# Patient Record
Sex: Male | Born: 1935 | Race: White | Hispanic: No | Marital: Married | Smoking: Never smoker
Health system: Southern US, Community
[De-identification: ages and names within clinical notes are randomized; demographics above are authoritative.]

## PROBLEM LIST (undated history)

## (undated) DIAGNOSIS — G950 Syringomyelia and syringobulbia: Secondary | ICD-10-CM

## (undated) DIAGNOSIS — S32019A Unspecified fracture of first lumbar vertebra, initial encounter for closed fracture: Secondary | ICD-10-CM

## (undated) DIAGNOSIS — I10 Essential (primary) hypertension: Secondary | ICD-10-CM

## (undated) DIAGNOSIS — S72142A Displaced intertrochanteric fracture of left femur, initial encounter for closed fracture: Secondary | ICD-10-CM

## (undated) DIAGNOSIS — Z9889 Other specified postprocedural states: Secondary | ICD-10-CM

## (undated) DIAGNOSIS — E785 Hyperlipidemia, unspecified: Secondary | ICD-10-CM

## (undated) DIAGNOSIS — H409 Unspecified glaucoma: Secondary | ICD-10-CM

## (undated) DIAGNOSIS — M5030 Other cervical disc degeneration, unspecified cervical region: Secondary | ICD-10-CM

## (undated) DIAGNOSIS — E119 Type 2 diabetes mellitus without complications: Secondary | ICD-10-CM

## (undated) DIAGNOSIS — M549 Dorsalgia, unspecified: Secondary | ICD-10-CM

## (undated) DIAGNOSIS — K5730 Diverticulosis of large intestine without perforation or abscess without bleeding: Secondary | ICD-10-CM

## (undated) DIAGNOSIS — N400 Enlarged prostate without lower urinary tract symptoms: Secondary | ICD-10-CM

## (undated) DIAGNOSIS — J90 Pleural effusion, not elsewhere classified: Secondary | ICD-10-CM

## (undated) DIAGNOSIS — K649 Unspecified hemorrhoids: Secondary | ICD-10-CM

## (undated) DIAGNOSIS — M48061 Spinal stenosis, lumbar region without neurogenic claudication: Secondary | ICD-10-CM

## (undated) DIAGNOSIS — K5900 Constipation, unspecified: Secondary | ICD-10-CM

## (undated) DIAGNOSIS — Z87448 Personal history of other diseases of urinary system: Secondary | ICD-10-CM

## (undated) DIAGNOSIS — Z9289 Personal history of other medical treatment: Secondary | ICD-10-CM

## (undated) DIAGNOSIS — G8929 Other chronic pain: Secondary | ICD-10-CM

## (undated) HISTORY — DX: Diverticulosis of large intestine without perforation or abscess without bleeding: K57.30

## (undated) HISTORY — DX: Displaced intertrochanteric fracture of left femur, initial encounter for closed fracture: S72.142A

## (undated) HISTORY — DX: Personal history of other medical treatment: Z92.89

## (undated) HISTORY — DX: Other cervical disc degeneration, unspecified cervical region: M50.30

## (undated) HISTORY — DX: Hyperlipidemia, unspecified: E78.5

## (undated) HISTORY — DX: Type 2 diabetes mellitus without complications: E11.9

## (undated) HISTORY — DX: Syringomyelia and syringobulbia: G95.0

## (undated) HISTORY — DX: Constipation, unspecified: K59.00

## (undated) HISTORY — DX: Pleural effusion, not elsewhere classified: J90

## (undated) HISTORY — DX: Personal history of other diseases of urinary system: Z87.448

## (undated) HISTORY — DX: Spinal stenosis, lumbar region without neurogenic claudication: M48.061

## (undated) HISTORY — DX: Unspecified hemorrhoids: K64.9

## (undated) HISTORY — DX: Other specified postprocedural states: Z98.890

## (undated) HISTORY — DX: Benign prostatic hyperplasia without lower urinary tract symptoms: N40.0

## (undated) HISTORY — DX: Unspecified fracture of first lumbar vertebra, initial encounter for closed fracture: S32.019A

---

## 1991-07-31 DIAGNOSIS — Z9889 Other specified postprocedural states: Secondary | ICD-10-CM

## 1991-07-31 HISTORY — DX: Other specified postprocedural states: Z98.890

## 1993-07-02 ENCOUNTER — Encounter: Payer: Self-pay | Admitting: Family Medicine

## 1995-01-31 ENCOUNTER — Encounter: Payer: Self-pay | Admitting: Family Medicine

## 1995-01-31 LAB — CONVERTED CEMR LAB: PSA: 0.7 ng/mL

## 1995-12-31 ENCOUNTER — Encounter: Payer: Self-pay | Admitting: Family Medicine

## 1997-08-30 ENCOUNTER — Encounter: Payer: Self-pay | Admitting: Family Medicine

## 1998-07-13 HISTORY — PX: PROSTATE BIOPSY: SHX241

## 1998-07-18 ENCOUNTER — Other Ambulatory Visit: Admission: RE | Admit: 1998-07-18 | Discharge: 1998-07-18 | Payer: Self-pay | Admitting: Urology

## 1998-11-24 ENCOUNTER — Ambulatory Visit (HOSPITAL_COMMUNITY): Admission: RE | Admit: 1998-11-24 | Discharge: 1998-11-24 | Payer: Self-pay | Admitting: Urology

## 1998-11-24 ENCOUNTER — Encounter: Payer: Self-pay | Admitting: Urology

## 1999-02-27 ENCOUNTER — Encounter: Admission: RE | Admit: 1999-02-27 | Discharge: 1999-03-18 | Payer: Self-pay | Admitting: Orthopaedic Surgery

## 1999-06-02 ENCOUNTER — Encounter: Payer: Self-pay | Admitting: Family Medicine

## 1999-06-02 LAB — CONVERTED CEMR LAB: PSA: 0.6 ng/mL

## 1999-08-31 HISTORY — PX: CATARACT EXTRACTION: SUR2

## 2000-06-01 ENCOUNTER — Encounter: Payer: Self-pay | Admitting: Family Medicine

## 2000-06-01 LAB — CONVERTED CEMR LAB: PSA: 0.4 ng/mL

## 2001-06-01 ENCOUNTER — Encounter: Payer: Self-pay | Admitting: Family Medicine

## 2001-06-01 LAB — CONVERTED CEMR LAB: PSA: 0.4 ng/mL

## 2001-07-06 ENCOUNTER — Encounter: Payer: Self-pay | Admitting: Orthopaedic Surgery

## 2001-07-06 ENCOUNTER — Encounter: Admission: RE | Admit: 2001-07-06 | Discharge: 2001-07-06 | Payer: Self-pay | Admitting: Orthopaedic Surgery

## 2001-07-07 ENCOUNTER — Ambulatory Visit (HOSPITAL_BASED_OUTPATIENT_CLINIC_OR_DEPARTMENT_OTHER): Admission: RE | Admit: 2001-07-07 | Discharge: 2001-07-08 | Payer: Self-pay | Admitting: Orthopaedic Surgery

## 2001-08-30 HISTORY — PX: SHOULDER SURGERY: SHX246

## 2002-03-30 ENCOUNTER — Encounter: Admission: RE | Admit: 2002-03-30 | Discharge: 2002-03-30 | Payer: Self-pay | Admitting: Family Medicine

## 2002-03-30 ENCOUNTER — Encounter: Payer: Self-pay | Admitting: Family Medicine

## 2002-06-01 ENCOUNTER — Encounter: Payer: Self-pay | Admitting: Family Medicine

## 2002-06-01 HISTORY — PX: CATARACT EXTRACTION: SUR2

## 2003-06-02 ENCOUNTER — Encounter: Payer: Self-pay | Admitting: Family Medicine

## 2003-06-02 LAB — CONVERTED CEMR LAB
Hgb A1c MFr Bld: 5.7 %
PSA: 0.4 ng/mL

## 2004-06-01 ENCOUNTER — Encounter: Payer: Self-pay | Admitting: Family Medicine

## 2004-06-01 LAB — CONVERTED CEMR LAB
Hgb A1c MFr Bld: 5.6 %
PSA: 0.37 ng/mL

## 2004-06-19 ENCOUNTER — Ambulatory Visit: Payer: Self-pay | Admitting: Family Medicine

## 2004-06-23 ENCOUNTER — Ambulatory Visit: Payer: Self-pay | Admitting: Family Medicine

## 2004-07-17 ENCOUNTER — Ambulatory Visit: Payer: Self-pay | Admitting: Family Medicine

## 2004-12-29 ENCOUNTER — Ambulatory Visit: Payer: Self-pay | Admitting: Family Medicine

## 2005-03-09 ENCOUNTER — Ambulatory Visit: Payer: Self-pay | Admitting: Family Medicine

## 2005-06-01 ENCOUNTER — Encounter: Payer: Self-pay | Admitting: Family Medicine

## 2005-06-01 DIAGNOSIS — G8929 Other chronic pain: Secondary | ICD-10-CM

## 2005-06-01 HISTORY — DX: Other chronic pain: G89.29

## 2005-06-01 LAB — CONVERTED CEMR LAB: Microalbumin U total vol: 3 mg/L

## 2005-06-25 ENCOUNTER — Ambulatory Visit: Payer: Self-pay | Admitting: Family Medicine

## 2005-06-30 ENCOUNTER — Ambulatory Visit: Payer: Self-pay | Admitting: Family Medicine

## 2005-07-23 ENCOUNTER — Ambulatory Visit: Payer: Self-pay | Admitting: Family Medicine

## 2005-07-30 ENCOUNTER — Encounter: Payer: Self-pay | Admitting: Family Medicine

## 2005-08-14 DIAGNOSIS — Z9289 Personal history of other medical treatment: Secondary | ICD-10-CM

## 2005-08-14 HISTORY — DX: Personal history of other medical treatment: Z92.89

## 2005-12-30 ENCOUNTER — Encounter: Payer: Self-pay | Admitting: Family Medicine

## 2006-01-08 ENCOUNTER — Ambulatory Visit: Payer: Self-pay | Admitting: Family Medicine

## 2006-01-12 ENCOUNTER — Ambulatory Visit: Payer: Self-pay | Admitting: Family Medicine

## 2006-04-12 ENCOUNTER — Encounter: Admission: RE | Admit: 2006-04-12 | Discharge: 2006-04-12 | Payer: Self-pay | Admitting: Orthopaedic Surgery

## 2006-05-21 ENCOUNTER — Encounter: Admission: RE | Admit: 2006-05-21 | Discharge: 2006-05-21 | Payer: Self-pay | Admitting: Orthopaedic Surgery

## 2006-07-14 ENCOUNTER — Ambulatory Visit: Payer: Self-pay | Admitting: Family Medicine

## 2006-07-14 LAB — CONVERTED CEMR LAB
ALT: 21 units/L (ref 0–40)
Albumin: 3.8 g/dL (ref 3.5–5.2)
Alkaline Phosphatase: 70 units/L (ref 39–117)
BUN: 17 mg/dL (ref 6–23)
Calcium: 9.3 mg/dL (ref 8.4–10.5)
Direct LDL: 147.1 mg/dL
GFR calc Af Amer: 81 mL/min
GFR calc non Af Amer: 67 mL/min
TSH: 5.31 microintl units/mL (ref 0.35–5.50)
Total CHOL/HDL Ratio: 5.4
Triglycerides: 155 mg/dL — ABNORMAL HIGH (ref 0–149)
VLDL: 31 mg/dL (ref 0–40)

## 2006-07-16 ENCOUNTER — Ambulatory Visit: Payer: Self-pay | Admitting: Family Medicine

## 2006-07-16 LAB — CONVERTED CEMR LAB
Microalb Creat Ratio: 2.3 mg/g (ref 0.0–30.0)
Microalb, Ur: 0.2 mg/dL (ref 0.0–1.9)

## 2006-08-10 ENCOUNTER — Ambulatory Visit: Payer: Self-pay | Admitting: Family Medicine

## 2006-12-30 ENCOUNTER — Encounter: Payer: Self-pay | Admitting: Family Medicine

## 2006-12-30 DIAGNOSIS — N400 Enlarged prostate without lower urinary tract symptoms: Secondary | ICD-10-CM | POA: Insufficient documentation

## 2006-12-30 DIAGNOSIS — K449 Diaphragmatic hernia without obstruction or gangrene: Secondary | ICD-10-CM | POA: Insufficient documentation

## 2006-12-30 DIAGNOSIS — I10 Essential (primary) hypertension: Secondary | ICD-10-CM | POA: Insufficient documentation

## 2006-12-30 DIAGNOSIS — E119 Type 2 diabetes mellitus without complications: Secondary | ICD-10-CM

## 2006-12-30 DIAGNOSIS — M1990 Unspecified osteoarthritis, unspecified site: Secondary | ICD-10-CM | POA: Insufficient documentation

## 2006-12-30 DIAGNOSIS — Z87448 Personal history of other diseases of urinary system: Secondary | ICD-10-CM

## 2006-12-30 DIAGNOSIS — M129 Arthropathy, unspecified: Secondary | ICD-10-CM | POA: Insufficient documentation

## 2006-12-30 DIAGNOSIS — E7800 Pure hypercholesterolemia, unspecified: Secondary | ICD-10-CM | POA: Insufficient documentation

## 2006-12-30 HISTORY — DX: Personal history of other diseases of urinary system: Z87.448

## 2007-01-18 ENCOUNTER — Ambulatory Visit: Payer: Self-pay | Admitting: Family Medicine

## 2007-02-07 ENCOUNTER — Ambulatory Visit: Payer: Self-pay | Admitting: Internal Medicine

## 2007-02-07 LAB — CONVERTED CEMR LAB
Glucose, Urine, Semiquant: NEGATIVE
Ketones, urine, test strip: NEGATIVE
Nitrite: NEGATIVE
Protein, U semiquant: 30
Specific Gravity, Urine: 1.01

## 2007-02-08 ENCOUNTER — Encounter: Payer: Self-pay | Admitting: Internal Medicine

## 2007-04-12 ENCOUNTER — Ambulatory Visit: Payer: Self-pay | Admitting: Family Medicine

## 2007-05-04 ENCOUNTER — Ambulatory Visit: Payer: Self-pay | Admitting: Family Medicine

## 2007-05-09 ENCOUNTER — Telehealth (INDEPENDENT_AMBULATORY_CARE_PROVIDER_SITE_OTHER): Payer: Self-pay | Admitting: *Deleted

## 2007-05-10 ENCOUNTER — Encounter: Payer: Self-pay | Admitting: Family Medicine

## 2007-07-25 ENCOUNTER — Ambulatory Visit: Payer: Self-pay | Admitting: Family Medicine

## 2007-07-25 LAB — CONVERTED CEMR LAB
ALT: 14 units/L (ref 0–53)
AST: 17 units/L (ref 0–37)
Alkaline Phosphatase: 66 units/L (ref 39–117)
BUN: 23 mg/dL (ref 6–23)
Bilirubin, Direct: 0.2 mg/dL (ref 0.0–0.3)
CO2: 31 meq/L (ref 19–32)
Calcium: 9.3 mg/dL (ref 8.4–10.5)
Chloride: 102 meq/L (ref 96–112)
Cholesterol: 176 mg/dL (ref 0–200)
Creatinine, Ser: 1.4 mg/dL (ref 0.4–1.5)
GFR calc Af Amer: 61 mL/min
Glucose, Bld: 136 mg/dL — ABNORMAL HIGH (ref 70–99)
Hgb A1c MFr Bld: 6.5 % — ABNORMAL HIGH (ref 4.6–6.0)
Microalb Creat Ratio: 16.4 mg/g (ref 0.0–30.0)
TSH: 6.23 microintl units/mL — ABNORMAL HIGH (ref 0.35–5.50)
Total Protein: 7 g/dL (ref 6.0–8.3)

## 2007-07-27 ENCOUNTER — Ambulatory Visit: Payer: Self-pay | Admitting: Family Medicine

## 2007-07-27 DIAGNOSIS — K5730 Diverticulosis of large intestine without perforation or abscess without bleeding: Secondary | ICD-10-CM

## 2007-07-27 HISTORY — DX: Diverticulosis of large intestine without perforation or abscess without bleeding: K57.30

## 2007-08-30 ENCOUNTER — Ambulatory Visit: Payer: Self-pay | Admitting: Family Medicine

## 2007-08-30 LAB — CONVERTED CEMR LAB
OCCULT 1: NEGATIVE
OCCULT 2: NEGATIVE

## 2007-08-31 ENCOUNTER — Encounter (INDEPENDENT_AMBULATORY_CARE_PROVIDER_SITE_OTHER): Payer: Self-pay | Admitting: *Deleted

## 2007-11-07 ENCOUNTER — Ambulatory Visit: Payer: Self-pay | Admitting: Family Medicine

## 2007-11-07 LAB — CONVERTED CEMR LAB
Bilirubin Urine: NEGATIVE
Specific Gravity, Urine: 1.01
pH: 6.5

## 2007-11-08 ENCOUNTER — Encounter: Payer: Self-pay | Admitting: Family Medicine

## 2007-12-06 ENCOUNTER — Ambulatory Visit: Payer: Self-pay | Admitting: Family Medicine

## 2008-01-12 ENCOUNTER — Ambulatory Visit: Payer: Self-pay | Admitting: Family Medicine

## 2008-01-14 LAB — CONVERTED CEMR LAB
CO2: 31 meq/L (ref 19–32)
Chloride: 107 meq/L (ref 96–112)
Glucose, Bld: 122 mg/dL — ABNORMAL HIGH (ref 70–99)
Hgb A1c MFr Bld: 6.6 % — ABNORMAL HIGH (ref 4.6–6.0)
Potassium: 4.9 meq/L (ref 3.5–5.1)
Sodium: 142 meq/L (ref 135–145)

## 2008-01-16 ENCOUNTER — Ambulatory Visit: Payer: Self-pay | Admitting: Family Medicine

## 2008-05-08 ENCOUNTER — Ambulatory Visit: Payer: Self-pay | Admitting: Family Medicine

## 2008-07-17 ENCOUNTER — Ambulatory Visit: Payer: Self-pay | Admitting: Family Medicine

## 2008-07-18 LAB — CONVERTED CEMR LAB
Alkaline Phosphatase: 73 units/L (ref 39–117)
Basophils Absolute: 0 10*3/uL (ref 0.0–0.1)
Bilirubin, Direct: 0.1 mg/dL (ref 0.0–0.3)
CO2: 33 meq/L — ABNORMAL HIGH (ref 19–32)
Calcium: 9.4 mg/dL (ref 8.4–10.5)
Cholesterol: 215 mg/dL (ref 0–200)
Direct LDL: 147.1 mg/dL
GFR calc Af Amer: 73 mL/min
Glucose, Bld: 134 mg/dL — ABNORMAL HIGH (ref 70–99)
Lymphocytes Relative: 24.7 % (ref 12.0–46.0)
MCHC: 34.6 g/dL (ref 30.0–36.0)
Microalb Creat Ratio: 2.6 mg/g (ref 0.0–30.0)
Microalb, Ur: 0.5 mg/dL (ref 0.0–1.9)
Monocytes Absolute: 0.9 10*3/uL (ref 0.1–1.0)
Monocytes Relative: 11.1 % (ref 3.0–12.0)
Platelets: 180 10*3/uL (ref 150–400)
Potassium: 5.2 meq/L — ABNORMAL HIGH (ref 3.5–5.1)
RDW: 11.9 % (ref 11.5–14.6)
Sodium: 142 meq/L (ref 135–145)
TSH: 4.86 microintl units/mL (ref 0.35–5.50)
Total Bilirubin: 0.8 mg/dL (ref 0.3–1.2)
Total CHOL/HDL Ratio: 5.3
Total Protein: 6.7 g/dL (ref 6.0–8.3)
Triglycerides: 138 mg/dL (ref 0–149)
VLDL: 28 mg/dL (ref 0–40)

## 2008-07-19 LAB — CONVERTED CEMR LAB: Vit D, 25-Hydroxy: 29 ng/mL — ABNORMAL LOW (ref 30–89)

## 2008-07-23 ENCOUNTER — Ambulatory Visit: Payer: Self-pay | Admitting: Family Medicine

## 2008-08-08 ENCOUNTER — Ambulatory Visit: Payer: Self-pay | Admitting: Family Medicine

## 2008-08-08 LAB — CONVERTED CEMR LAB: OCCULT 3: NEGATIVE

## 2008-08-09 ENCOUNTER — Encounter (INDEPENDENT_AMBULATORY_CARE_PROVIDER_SITE_OTHER): Payer: Self-pay | Admitting: *Deleted

## 2008-10-01 ENCOUNTER — Ambulatory Visit: Payer: Self-pay | Admitting: Family Medicine

## 2008-11-06 ENCOUNTER — Telehealth: Payer: Self-pay | Admitting: Family Medicine

## 2008-11-06 ENCOUNTER — Ambulatory Visit: Payer: Self-pay | Admitting: Family Medicine

## 2008-11-09 ENCOUNTER — Ambulatory Visit: Payer: Self-pay | Admitting: Family Medicine

## 2008-11-09 LAB — CONVERTED CEMR LAB
BUN: 18 mg/dL (ref 6–23)
CO2: 30 meq/L (ref 19–32)
Calcium: 9.2 mg/dL (ref 8.4–10.5)
Chloride: 107 meq/L (ref 96–112)
Creatinine, Ser: 1.2 mg/dL (ref 0.4–1.5)
Glucose, Bld: 124 mg/dL — ABNORMAL HIGH (ref 70–99)

## 2008-11-13 ENCOUNTER — Ambulatory Visit: Payer: Self-pay | Admitting: Family Medicine

## 2008-12-11 ENCOUNTER — Encounter (INDEPENDENT_AMBULATORY_CARE_PROVIDER_SITE_OTHER): Payer: Self-pay | Admitting: Internal Medicine

## 2008-12-11 ENCOUNTER — Ambulatory Visit: Payer: Self-pay | Admitting: Family Medicine

## 2008-12-13 ENCOUNTER — Ambulatory Visit: Payer: Self-pay | Admitting: Family Medicine

## 2009-01-16 ENCOUNTER — Ambulatory Visit: Payer: Self-pay | Admitting: Family Medicine

## 2009-01-21 ENCOUNTER — Ambulatory Visit: Payer: Self-pay | Admitting: Family Medicine

## 2009-02-25 ENCOUNTER — Telehealth: Payer: Self-pay | Admitting: Family Medicine

## 2009-06-08 ENCOUNTER — Telehealth: Payer: Self-pay | Admitting: Family Medicine

## 2009-06-27 ENCOUNTER — Ambulatory Visit: Payer: Self-pay | Admitting: Family Medicine

## 2009-06-27 LAB — CONVERTED CEMR LAB
Albumin: 4.2 g/dL (ref 3.5–5.2)
Alkaline Phosphatase: 78 units/L (ref 39–117)
BUN: 21 mg/dL (ref 6–23)
Basophils Absolute: 0 10*3/uL (ref 0.0–0.1)
Basophils Relative: 0.2 % (ref 0.0–3.0)
CO2: 31 meq/L (ref 19–32)
Calcium: 9.7 mg/dL (ref 8.4–10.5)
Cholesterol: 212 mg/dL — ABNORMAL HIGH (ref 0–200)
Creatinine, Ser: 1.3 mg/dL (ref 0.4–1.5)
Direct LDL: 153.2 mg/dL
Eosinophils Absolute: 0.2 10*3/uL (ref 0.0–0.7)
GFR calc non Af Amer: 54.72 mL/min (ref >60)
Glucose, Bld: 143 mg/dL — ABNORMAL HIGH (ref 70–99)
HDL: 45.3 mg/dL (ref >39.00)
MCHC: 32.2 g/dL (ref 30.0–36.0)
MCV: 92.1 fL (ref 78.0–100.0)
Microalb, Ur: 0.5 mg/dL (ref 0.0–1.9)
Monocytes Absolute: 1.2 10*3/uL — ABNORMAL HIGH (ref 0.1–1.0)
Neutro Abs: 5.2 10*3/uL (ref 1.4–7.7)
Neutrophils Relative %: 60.1 % (ref 43.0–77.0)
RBC: 4.8 M/uL (ref 4.22–5.81)
RDW: 12.2 % (ref 11.5–14.6)
TSH: 5.62 microintl units/mL — ABNORMAL HIGH (ref 0.35–5.50)
Total Protein: 7.2 g/dL (ref 6.0–8.3)
VLDL: 25 mg/dL (ref 0.0–40.0)

## 2009-07-04 ENCOUNTER — Ambulatory Visit: Payer: Self-pay | Admitting: Family Medicine

## 2009-07-18 ENCOUNTER — Ambulatory Visit: Payer: Self-pay | Admitting: Family Medicine

## 2009-07-24 ENCOUNTER — Ambulatory Visit: Payer: Self-pay | Admitting: Family Medicine

## 2009-07-24 ENCOUNTER — Encounter (INDEPENDENT_AMBULATORY_CARE_PROVIDER_SITE_OTHER): Payer: Self-pay | Admitting: *Deleted

## 2009-07-24 LAB — CONVERTED CEMR LAB
OCCULT 2: NEGATIVE
OCCULT 3: NEGATIVE

## 2010-01-07 ENCOUNTER — Encounter (INDEPENDENT_AMBULATORY_CARE_PROVIDER_SITE_OTHER): Payer: Self-pay | Admitting: *Deleted

## 2010-03-27 ENCOUNTER — Ambulatory Visit: Payer: Self-pay | Admitting: Family Medicine

## 2010-05-28 ENCOUNTER — Ambulatory Visit
Admission: RE | Admit: 2010-05-28 | Discharge: 2010-05-28 | Payer: Self-pay | Source: Home / Self Care | Attending: Family Medicine | Admitting: Family Medicine

## 2010-05-28 DIAGNOSIS — R351 Nocturia: Secondary | ICD-10-CM | POA: Insufficient documentation

## 2010-05-28 LAB — CONVERTED CEMR LAB
Bacteria, UA: 0
Blood in Urine, dipstick: NEGATIVE
Ketones, urine, test strip: NEGATIVE
Nitrite: NEGATIVE
RBC / HPF: 0
Specific Gravity, Urine: 1.02

## 2010-06-05 ENCOUNTER — Ambulatory Visit
Admission: RE | Admit: 2010-06-05 | Discharge: 2010-06-05 | Payer: Self-pay | Source: Home / Self Care | Attending: Family Medicine | Admitting: Family Medicine

## 2010-06-05 DIAGNOSIS — J301 Allergic rhinitis due to pollen: Secondary | ICD-10-CM | POA: Insufficient documentation

## 2010-06-22 ENCOUNTER — Encounter: Payer: Self-pay | Admitting: Orthopaedic Surgery

## 2010-07-02 ENCOUNTER — Encounter: Payer: Self-pay | Admitting: Family Medicine

## 2010-07-02 ENCOUNTER — Ambulatory Visit: Admit: 2010-07-02 | Payer: Self-pay | Admitting: Family Medicine

## 2010-07-02 ENCOUNTER — Other Ambulatory Visit: Payer: Self-pay | Admitting: Family Medicine

## 2010-07-02 ENCOUNTER — Encounter (INDEPENDENT_AMBULATORY_CARE_PROVIDER_SITE_OTHER): Payer: Self-pay | Admitting: *Deleted

## 2010-07-02 ENCOUNTER — Other Ambulatory Visit (INDEPENDENT_AMBULATORY_CARE_PROVIDER_SITE_OTHER): Payer: Medicare Other

## 2010-07-02 DIAGNOSIS — N400 Enlarged prostate without lower urinary tract symptoms: Secondary | ICD-10-CM

## 2010-07-02 DIAGNOSIS — E559 Vitamin D deficiency, unspecified: Secondary | ICD-10-CM | POA: Insufficient documentation

## 2010-07-02 DIAGNOSIS — E7800 Pure hypercholesterolemia, unspecified: Secondary | ICD-10-CM

## 2010-07-02 DIAGNOSIS — I10 Essential (primary) hypertension: Secondary | ICD-10-CM

## 2010-07-02 DIAGNOSIS — E119 Type 2 diabetes mellitus without complications: Secondary | ICD-10-CM

## 2010-07-02 DIAGNOSIS — Z87448 Personal history of other diseases of urinary system: Secondary | ICD-10-CM

## 2010-07-02 DIAGNOSIS — K219 Gastro-esophageal reflux disease without esophagitis: Secondary | ICD-10-CM

## 2010-07-02 DIAGNOSIS — K5730 Diverticulosis of large intestine without perforation or abscess without bleeding: Secondary | ICD-10-CM

## 2010-07-02 LAB — MICROALBUMIN / CREATININE URINE RATIO
Creatinine,U: 156.4 mg/dL
Microalb Creat Ratio: 0.4 mg/g (ref 0.0–30.0)

## 2010-07-02 LAB — CBC WITH DIFFERENTIAL/PLATELET
Basophils Relative: 0.4 % (ref 0.0–3.0)
HCT: 43.4 % (ref 39.0–52.0)
Hemoglobin: 14.5 g/dL (ref 13.0–17.0)
Lymphocytes Relative: 28.2 % (ref 12.0–46.0)
Lymphs Abs: 2.8 10*3/uL (ref 0.7–4.0)
MCHC: 33.3 g/dL (ref 30.0–36.0)
Monocytes Relative: 9.9 % (ref 3.0–12.0)
Neutro Abs: 5.9 10*3/uL (ref 1.4–7.7)
RBC: 4.8 Mil/uL (ref 4.22–5.81)

## 2010-07-02 LAB — RENAL FUNCTION PANEL
Albumin: 4.1 g/dL (ref 3.5–5.2)
CO2: 32 mEq/L (ref 19–32)
Chloride: 100 mEq/L (ref 96–112)
GFR: 63.53 mL/min (ref >60.00)
Phosphorus: 2.7 mg/dL (ref 2.3–4.6)
Potassium: 4.5 mEq/L (ref 3.5–5.1)

## 2010-07-02 LAB — HEPATIC FUNCTION PANEL
ALT: 18 U/L (ref 0–53)
AST: 20 U/L (ref 0–37)
Albumin: 4.1 g/dL (ref 3.5–5.2)
Total Bilirubin: 0.9 mg/dL (ref 0.3–1.2)
Total Protein: 6.8 g/dL (ref 6.0–8.3)

## 2010-07-02 LAB — LIPID PANEL
Cholesterol: 182 mg/dL (ref 0–200)
LDL Cholesterol: 115 mg/dL — ABNORMAL HIGH (ref 0–99)
Triglycerides: 128 mg/dL (ref 0.0–149.0)

## 2010-07-02 LAB — TSH: TSH: 4.62 u[IU]/mL (ref 0.35–5.50)

## 2010-07-03 LAB — CONVERTED CEMR LAB: Vit D, 25-Hydroxy: 31 ng/mL (ref 30–89)

## 2010-07-03 NOTE — Assessment & Plan Note (Signed)
Summary: COUGH/RBH   Vital Signs:  Patient profile:   77 year old male Weight:      213.50 pounds Temp:     98.1 degrees F oral Pulse rate:   80 / minute Pulse rhythm:   regular BP sitting:   124 / 64  (left arm) Cuff size:   large  Vitals Entered By: Sydell Axon LPN (July 18, 2009 3:22 PM) CC: Cough X 2 weeks, non-productive   History of Present Illness: Pt here with wife for hacky cough. He has no fever or chills, no headache, no ear pain, some rhinitis that is clear, no ST, cough that is proiductive of PND that is clear. No SOB, no N/V.  He has taken vicks 44 which usually stops cough but returns quickly. He is sneezing a lot...he usually hardly ever sneezes. The cough is usually after eating.  Problems Prior to Update: 1)  Back Strain, Lumbar Lateral  (ICD-847.2) 2)  Gerd  (ICD-530.81) 3)  Special Screening Malig Neoplasms Other Sites  (ICD-V76.49) 4)  Hip Pain(DEGEN JOINT DZ,SEVERE OSTEOARTH OF SPINE)  (ICD-719.45) 5)  Renal Cyst,prost Bx Negative (HUMPHRIES)  (ICD-593.2) 6)  Hypercholesterolemia/trig 216/217  (ICD-272.0) 7)  Diverticulosis, Colon  (ICD-562.10) 8)  Hiatal Hernia  (ICD-553.3) 9)  Prostatitis, Hx of  (ICD-V13.09) 10)  Arthritis, Multiple Joint Injuries  (ICD-716.90) 11)  Degenerative Joint Disease  (ICD-715.90) 12)  Benign Prostatic Hypertrophy  (ICD-600.00) 13)  Hypertension  (ICD-401.9) 14)  Diabetes Mellitus, Type II  (ICD-250.00)  Medications Prior to Update: 1)  Timoptic 0.5 %  Soln (Timolol Maleate) .Marland Kitchen.. 1 Drop Twice A Day To Eyes 2)  Lisinopril-Hydrochlorothiazide 10-12.5 Mg Tabs (Lisinopril-Hydrochlorothiazide) .Marland Kitchen.. 1 Daily By Mouth  Allergies: 1)  ! Atenolol (Atenolol) 2)  Nexium (Esomeprazole Magnesium) 3)  Floxin Otic Singles 4)  * Vioxx (Rofecoxib)  Physical Exam  General:  Alert, well-developed, well-nourished, and well-hydrated.  Hard of hearing, even with two hearing aids. Head:  Normocephalic and atraumatic without obvious  abnormalities. No apparent alopecia or balding, mild thinning. Sinuses NT. Eyes:  Conjunctiva clear bilaterally.  Ears:  External ear exam shows no significant lesions or deformities.  Otoscopic examination reveals clear canals, tympanic membranes are intact bilaterally without bulging, retraction, inflammation or discharge. Hearing is decreased  bilaterally, evebn with hearing aids bilat. Nose:  External nasal examination shows no deformity or inflammation. Nasal mucosa are pink and moist without lesions or exudates. Mild discharge that is clear. Mouth:  Oral mucosa and oropharynx without lesions or exudates.  Teeth in good repair. Mild clear PND. Neck:  No deformities, masses, or tenderness noted. Lungs:  Normal respiratory effort, chest expands symmetrically. Lungs are clear to auscultation, no crackles or wheezes. Heart:  Normal rate and regular rhythm. S1 and S2 normal without gallop, murmur, click, rub or other extra sounds.   Impression & Recommendations:  Problem # 1:  URI (ICD-465.9) Assessment New  Poss allergy overlay. See instructions. His updated medication list for this problem includes:    Tessalon 200 Mg Caps (Benzonatate) ..... One tab by mouth three times a day as needed cough  Instructed on symptomatic treatment. Call if symptoms persist or worsen.   Orders: Prescription Created Electronically [id number])  Complete Medication List: 1)  Timoptic 0.5 % Soln (Timolol maleate) .Marland Kitchen.. 1 drop twice a day to eyes 2)  Lisinopril-hydrochlorothiazide 10-12.5 Mg Tabs (Lisinopril-hydrochlorothiazide) .Marland Kitchen.. 1 daily by mouth 3)  Tessalon 200 Mg Caps (Benzonatate) .... One tab by mouth three times a day as needed cough  Patient Instructions: 1)  Take Claritin 10mg  daily for a few weeks. 2)  Use Tessalon three times a day as needed for cough. 3)  Drink lots of fluids. Prescriptions: TESSALON 200 MG CAPS (BENZONATATE) one tab by mouth three times a day as needed cough  #30 x 0   Entered  and Authorized by:   Shaune Leeks MD   Signed by:   Shaune Leeks MD on 07/18/2009   Method used:   Print then Give to Patient   RxID:   [id number] TESSALON 200 MG CAPS (BENZONATATE) one tab by mouth three times a day as needed cough  #30 x 0   Entered and Authorized by:   Shaune Leeks MD   Signed by:   Shaune Leeks MD on 07/18/2009   Method used:   Electronically to        [facility]  [address] [id number] (ret[facility])       [address]       [place], [place]  [address]       Ph: [phone]       Fax: [phone]   RxID:   [id number]   Current Allergies (reviewed today): ! ATENOLOL (ATENOLOL) NEXIUM (ESOMEPRAZOLE MAGNESIUM) FLOXIN OTIC SINGLES * VIOXX (ROFECOXIB)

## 2010-07-03 NOTE — Letter (Signed)
Summary: Nadara Eaton letter  [place] [facility]  [address] [place], [place] [address]   Phone: [phone]  Fax: [phone]       01/07/2010 MRN: [id number]  JOSEDE CICERO [address] [place], [place]  [address]  Dear Mr. [facility] [place], and [facility] announce the retirement of Arta Silence, M.D., from full-time practice at the [facility] office effective November 28, 2009 and his plans of returning part-time.  It is important to Dr. Hetty Ely and to our practice that you understand that [facility] has seven physicians in our office for your health care needs.  We will continue to offer the same exceptional care that you have today.    Dr. Hetty Ely has spoken to many of you about his plans for retirement and returning part-time in the fall.   We will continue to work with you through the transition to schedule appointments for you in the office and meet the high standards that [facility] is committed to.   Again, it is with great pleasure that we share the news that Dr. Hetty Ely will return to [facility] in October of 2011 with a reduced schedule.    If you have any questions, or would like to request an appointment with one of our physicians, please call us at [phone] and press the option for Scheduling an appointment.  We take pleasure in providing you with excellent patient care and look forward to seeing you at your next office visit.  Our [facility] Physicians are:  Tillman Abide, M.D. Laurita Quint, M.D. Roxy Manns, M.D. Kerby Nora, M.D. Hannah Beat, M.D. Ruthe Mannan, M.D. We proudly welcomed Raechel Ache, M.D. and Eustaquio Boyden, M.D. to the practice in July/August 2011.  Sincerely,  [facility]

## 2010-07-03 NOTE — Assessment & Plan Note (Signed)
Summary: MEDICARE CPX  CYD   Vital Signs:  Patient profile:   77 year old male Weight:      208 pounds Temp:     97.7 degrees F oral Pulse rate:   68 / minute Pulse rhythm:   regular BP sitting:   130 / 68  (left arm) Cuff size:   large  Vitals Entered By: Sydell Axon LPN (July 04, 2009 1:55 PM) CC: 30 Minute checkup, hemoccult cards given to patient, stopped taking his Furosemide last week because it made him go to the bathroom all the time   History of Present Illness: Pt here for followup. He feels ok. He is having back problems which come and go...can tell the weather as good as the weatherman.   Preventive Screening-Counseling & Management  Alcohol-Tobacco     Alcohol drinks/day: 0     Smoking Status: never     Passive Smoke Exposure: no  Caffeine-Diet-Exercise     Caffeine use/day: 2     Does Patient Exercise: yes     Type of exercise: walks and gym at home     Times/week: 4  Problems Prior to Update: 1)  Cellulitis, Neck  (ICD-682.1) 2)  Edema  (ICD-782.3) 3)  Back Strain, Lumbar Lateral  (ICD-847.2) 4)  Gerd  (ICD-530.81) 5)  Special Screening Malig Neoplasms Other Sites  (ICD-V76.49) 6)  Hip Pain(DEGEN JOINT DZ,SEVERE OSTEOARTH OF SPINE)  (ICD-719.45) 7)  Renal Cyst,prost Bx Negative (HUMPHRIES)  (ICD-593.2) 8)  Hypercholesterolemia/trig 216/217  (ICD-272.0) 9)  Diverticulosis, Colon  (ICD-562.10) 10)  Hiatal Hernia  (ICD-553.3) 11)  Prostatitis, Hx of  (ICD-V13.09) 12)  Arthritis, Multiple Joint Injuries  (ICD-716.90) 13)  Degenerative Joint Disease  (ICD-715.90) 14)  Benign Prostatic Hypertrophy  (ICD-600.00) 15)  Hypertension  (ICD-401.9) 16)  Diabetes Mellitus, Type II  (ICD-250.00)  Medications Prior to Update: 1)  Timoptic 0.5 %  Soln (Timolol Maleate) .Marland Kitchen.. 1 Drop Twice A Day To Eyes 2)  Lisinopril-Hydrochlorothiazide 10-12.5 Mg Tabs (Lisinopril-Hydrochlorothiazide) .Marland Kitchen.. 1 Daily By Mouth 3)  Furosemide 20 Mg Tabs (Furosemide) .... Take One By  Mouth Daily As Needed  Allergies: 1)  ! Atenolol (Atenolol) 2)  Nexium (Esomeprazole Magnesium) 3)  Floxin Otic Singles 4)  * Vioxx (Rofecoxib)  Past History:  Past Medical History: Last updated: 12/30/2006 Diabetes mellitus, type II Hypertension Benign prostatic hypertrophy Diverticulosis, colon  Past Surgical History: Last updated: 07/27/2007 BE Diverticuli  07/1991 Prostate bx- benign, cystoscopy  wnl 07/13/98 Shoulder surgery right, [place] 04/03 Cataract left eye removed 04/01, right removed 01/04 CT abd with and without right adrenal adenoma-stable 08/14/05 CT Pelvis with wnl 08/14/05  Family History: Last updated: 07/04/2009 Father: dec 100yoa natural causes Mother: dec 78  stomach cancer 3 Brothers, 2 with back operations  1 dec 86 Lung Ca                                             2 Sisters A  one with back operation. 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 CV - HBP + DM - Stomach cancer, colon cancer- Mother - Stroke  Review of Systems General:  Denies chills, fatigue, fever, sweats, weakness, and weight loss. Eyes:  Denies blurring and discharge. ENT:  Complains of decreased hearing; denies earache; hearing aids bilat.. CV:  Denies chest pain or discomfort, fainting, fatigue, palpitations, and shortness of breath with exertion. Resp:  Denies chest pain with inspiration, cough, shortness of  breath, and wheezing. GI:  Denies abdominal pain, bloody stools, change in bowel habits, constipation, dark tarry stools, diarrhea, indigestion, loss of appetite, nausea, vomiting, vomiting blood, and yellowish skin color. GU:  Denies dysuria, nocturia, and urinary hesitancy. MS:  Complains of low back pain and stiffness; denies joint pain. Derm:  Denies dryness, itching, and rash. Neuro:  Denies memory loss, numbness, poor balance, seizures, tingling, and tremors; gets up too fast sometimes..  Physical Exam  General:  Alert, well-developed, well-nourished, and well-hydrated.  Hard of hearing, even with two hearing aids. Head:  Normocephalic and atraumatic without obvious abnormalities. No apparent alopecia or balding, mild thinning. Sinuses NT. Eyes:  Conjunctiva clear bilaterally.  Ears:  External ear exam shows no significant lesions or deformities.  Otoscopic examination reveals clear canals, tympanic membranes are intact bilaterally without bulging, retraction, inflammation or discharge. Hearing is decreased  bilaterally, evebn with hearing aids bilat. Nose:  External nasal examination shows no deformity or inflammation. Nasal mucosa are pink and moist without lesions or exudates. Mouth:  Oral mucosa and oropharynx without lesions or exudates.  Teeth in good repair. Neck:  No deformities, masses, or tenderness noted. Chest Wall:  No deformities, masses, tenderness or gynecomastia noted. Breasts:  No masses or gynecomastia noted Lungs:  Normal respiratory effort, chest expands symmetrically. Lungs are clear to auscultation, no crackles or wheezes. Heart:  Normal rate and regular rhythm. S1 and S2 normal without gallop, murmur, click, rub or other extra sounds. Abdomen:  Bowel sounds positive,abdomen soft and non-tender without masses, organomegaly or hernias noted. Rectal:  Not done. Genitalia:  Not done. Msk:  No CVAT. Discomfort at the area immediately above the PSIS on the right side  w/o swelling,  erythema or  significant pain to palpation but discomfort to stretching of the area. Some LB discomfort with lyinig down and getiting uop from exam table supine. Pulses:  R and L carotid,radial,femoral,dorsalis pedis and posterior tibial pulses are full and equal bilaterally Extremities:  No clubbing, cyanosis, edema, or deformity noted with normal full range of motion of all joints.   Neurologic:  No cranial nerve deficits noted. Station and gait are normal. Sensory, motor and coordinative functions appear intact. Skin:  Intact without suspicious lesions or rashes, AKs and SKs throughout. Cervical Nodes:  No lymphadenopathy noted Inguinal Nodes:  No significant adenopathy Psych:  Cognition and judgment appear intact. Alert and cooperative with normal attention span and concentration. No apparent delusions, illusions, hallucinations   Impression & Recommendations:  Problem # 1:  DIABETES MELLITUS, TYPE II (ICD-250.00) Assessment Unchanged Stable. Doing well on present meds. His updated medication list for this problem includes:    Lisinopril-hydrochlorothiazide 10-12.5 Mg Tabs (  Lisinopril-hydrochlorothiazide) .Marland Kitchen... 1 daily by mouth  Labs Reviewed: Creat: 1.3 (06/27/2009)   Microalbumin: 2.3 (07/16/2006) Reviewed HgBA1c results: 6.9 (06/27/2009)  6.6 (01/16/2009)  Problem # 2:  HYPERCHOLESTEROLEMIA/TRIG 216/217 (ICD-272.0) Assessment: Unchanged Adequate except for LDL but pt declines medication. Labs Reviewed: SGOT: 20 (06/27/2009)   SGPT: 18 (06/27/2009)   HDL:45.30 (06/27/2009), 40.6 (07/17/2008)  LDL:DEL (07/17/2008), 119 (07/25/2007)  Chol:212 (06/27/2009), 215 (07/17/2008)  Trig:125.0 (06/27/2009), 138 (07/17/2008)  Problem # 3:  HYPERTENSION (ICD-401.9) Assessment: Unchanged Pt stopped his Lasix with better results of urination. The following medications were removed from the medication list:    Furosemide 20 Mg Tabs (Furosemide) .Marland Kitchen... Take one by mouth daily as  needed His updated medication list for this problem includes:    Lisinopril-hydrochlorothiazide 10-12.5 Mg Tabs (Lisinopril-hydrochlorothiazide) .Marland Kitchen... 1 daily by mouth  Problem # 4:  BACK STRAIN, LUMBAR LATERAL (ICD-847.2) Assessment: Unchanged Stable. Does well with this and wants no further trmt.  Problem # 5:  GERD (ICD-530.81) Assessment: Unchanged Stable. Has tailored his diet to control.  Problem # 6:  HIP PAIN(DEGEN JOINT DZ,SEVERE OSTEOARTH OF SPINE) (ICD-719.45) Assessment: Unchanged Stable, gets around well.  Problem # 7:  DIVERTICULOSIS, COLON (ICD-562.10) Assessment: Unchanged Discussed being seen for prolonged LLQ discomfort.  Complete Medication List: 1)  Timoptic 0.5 % Soln (Timolol maleate) .Marland Kitchen.. 1 drop twice a day to eyes 2)  Lisinopril-hydrochlorothiazide 10-12.5 Mg Tabs (Lisinopril-hydrochlorothiazide) .Marland Kitchen.. 1 daily by mouth  Patient Instructions: 1)  RTC as needed or one year.  Current Allergies (reviewed today): ! ATENOLOL (ATENOLOL) NEXIUM (ESOMEPRAZOLE MAGNESIUM) FLOXIN OTIC SINGLES * VIOXX (ROFECOXIB)

## 2010-07-03 NOTE — Assessment & Plan Note (Signed)
Summary: cough, stopped up/alc   Vital Signs:  Patient profile:   77 year old male Weight:      204.50 pounds Temp:     97.0 degrees F oral Pulse rate:   80 / minute Pulse rhythm:   regular BP sitting:   128 / 68  (left arm) Cuff size:   large  Vitals Entered By: Sydell Axon LPN (June 05, 2010 10:23 AM) CC: Sneezing, non-productive cough and head is stopped up   History of Present Illness: Pt here for congestion. He was seen recently for nocturia he thought was prostate but was treally his back. Taking Tyl regularly at night has helpewd that. Now today he is having no fever or chills. He has spells of sneezing 5-6 times with coughing that has gotten the lower part of his chest hurting. He is nonproductive when coughing...sometimes phlegm without itching. His nose drips spontaeously and tearing of the eyes but no itching. He denies ST, ear pain or headache.  Problems Prior to Update: 1)  Nocturia  (ICD-788.43) 2)  Back Strain, Lumbar Lateral  (ICD-847.2) 3)  Gerd  (ICD-530.81) 4)  Special Screening Malig Neoplasms Other Sites  (ICD-V76.49) 5)  Hip Pain(DEGEN JOINT DZ,SEVERE OSTEOARTH OF SPINE)  (ICD-719.45) 6)  Renal Cyst,prost Bx Negative (HUMPHRIES)  (ICD-593.2) 7)  Hypercholesterolemia/trig 216/217  (ICD-272.0) 8)  Diverticulosis, Colon  (ICD-562.10) 9)  Hiatal Hernia  (ICD-553.3) 10)  Prostatitis, Hx of  (ICD-V13.09) 11)  Arthritis, Multiple Joint Injuries  (ICD-716.90) 12)  Degenerative Joint Disease  (ICD-715.90) 13)  Benign Prostatic Hypertrophy  (ICD-600.00) 14)  Hypertension  (ICD-401.9) 15)  Diabetes Mellitus, Type II  (ICD-250.00)  Medications Prior to Update: 1)  Timoptic 0.5 %  Soln (Timolol Maleate) .Marland Kitchen.. 1 Drop Twice A Day To Eyes 2)  Lisinopril-Hydrochlorothiazide 10-12.5 Mg Tabs (Lisinopril-Hydrochlorothiazide) .Marland Kitchen.. 1 Daily By Mouth  Current Medications (verified): 1)  Timoptic 0.5 %  Soln (Timolol Maleate) .Marland Kitchen.. 1 Drop Twice A Day To Eyes 2)   Lisinopril-Hydrochlorothiazide 10-12.5 Mg Tabs (Lisinopril-Hydrochlorothiazide) .Marland Kitchen.. 1 Daily By Mouth  Allergies: 1)  ! Atenolol (Atenolol) 2)  Nexium (Esomeprazole Magnesium) 3)  Floxin Otic Singles 4)  * Vioxx (Rofecoxib)  Physical Exam  General:  Alert, well-developed, well-nourished, and well-hydrated.  Hard of hearing, even with two hearing aids. Noncongested. Head:  Normocephalic and atraumatic without obvious abnormalities. No apparent alopecia or balding, mild thinning. Sinuses NT. Eyes:  Conjunctiva clear bilaterally bulbar, palpebral inflamed without dischaarge..  Ears:  External ear exam shows no significant lesions or deformities.  Otoscopic examination reveals clear canals, tympanic membranes are intact bilaterally without bulging, retraction, inflammation or discharge. Hearing is decreased  bilaterally, evebn with hearing aids bilat. Nose:  External nasal examination shows no deformity or inflammation. Nasal mucosa are pink and moist without lesions or exudates. Clear but inflamed. Neck:  No deformities, masses, or tenderness noted. Lungs:  Normal respiratory effort, chest expands symmetrically. Lungs are clear to auscultation, no crackles or wheezes. Heart:  Normal rate and regular rhythm. S1 and S2 normal without gallop, murmur, click, rub or other extra sounds.   Impression & Recommendations:  Problem # 1:  ALLERGIC RHINITIS, SEASONAL (ICD-477.0) Assessment New See instructions.  Problem # 2:  NOCTURIA (DVV-616.07) Assessment: Improved Doing well and not really waking up anymore.  Complete Medication List: 1)  Timoptic 0.5 % Soln (Timolol maleate) .Marland Kitchen.. 1 drop twice a day to eyes 2)  Lisinopril-hydrochlorothiazide 10-12.5 Mg Tabs (Lisinopril-hydrochlorothiazide) .Marland Kitchen.. 1 daily by mouth  Patient Instructions: 1)  Take Cetirizine (Zyrtec) 10mg  OTC at night, or 2)  Loratidine (Claritin) 10mg  OTC, or  3)  Fexofenadine (Allegra) 180mg .   Orders Added: 1)  Est.  Patient Level III [36644] 2)  Est. Patient Level III [03474]    Current Allergies (reviewed today): ! ATENOLOL (ATENOLOL) NEXIUM (ESOMEPRAZOLE MAGNESIUM) FLOXIN OTIC SINGLES * VIOXX (ROFECOXIB)

## 2010-07-03 NOTE — Assessment & Plan Note (Signed)
Summary: DISCUSS MEDICINE   Vital Signs:  Patient profile:   77 year old male Weight:      208.50 pounds BMI:     31.35 Temp:     98.4 degrees F oral Pulse rate:   84 / minute Pulse rhythm:   regular BP sitting:   134 / 72  (left arm) Cuff size:   large  Vitals Entered By: Sydell Axon LPN (March 27, 2010 3:37 PM) CC: Discuss medicaiton, some confusion about his Lisinopril and fluid pill   History of Present Illness: Pt here with his wife for followup appt. They had read in the paper that a lady was having cough and she had her Lisinopril changed for BP control and her cough resolved. Ronald Ball is having an occaional cough that typically is associated with PND and some sputum production. It does not happen every day and typically not out of the clear blue. He does not feel sick or have fever or chills.  He had taken Lasix in the past for edema but told me in Feb that he was not going to take it anymore as it caused him to need to go to the bathroom, sometimes so quickly that he loses control. He otherwise feels well and has no complaints. He uses Zantac for reflux when needed.  Problems Prior to Update: 1)  Back Strain, Lumbar Lateral  (ICD-847.2) 2)  Gerd  (ICD-530.81) 3)  Special Screening Malig Neoplasms Other Sites  (ICD-V76.49) 4)  Hip Pain(DEGEN JOINT DZ,SEVERE OSTEOARTH OF SPINE)  (ICD-719.45) 5)  Renal Cyst,prost Bx Negative (HUMPHRIES)  (ICD-593.2) 6)  Hypercholesterolemia/trig 216/217  (ICD-272.0) 7)  Diverticulosis, Colon  (ICD-562.10) 8)  Hiatal Hernia  (ICD-553.3) 9)  Prostatitis, Hx of  (ICD-V13.09) 10)  Arthritis, Multiple Joint Injuries  (ICD-716.90) 11)  Degenerative Joint Disease  (ICD-715.90) 12)  Benign Prostatic Hypertrophy  (ICD-600.00) 13)  Hypertension  (ICD-401.9) 14)  Diabetes Mellitus, Type II  (ICD-250.00)  Medications Prior to Update: 1)  Timoptic 0.5 %  Soln (Timolol Maleate) .Marland Kitchen.. 1 Drop Twice A Day To Eyes 2)  Lisinopril-Hydrochlorothiazide  10-12.5 Mg Tabs (Lisinopril-Hydrochlorothiazide) .Marland Kitchen.. 1 Daily By Mouth 3)  Tessalon 200 Mg Caps (Benzonatate) .... One Tab By Mouth Three Times A Day As Needed Cough  Allergies: 1)  ! Atenolol (Atenolol) 2)  Nexium (Esomeprazole Magnesium) 3)  Floxin Otic Singles 4)  * Vioxx (Rofecoxib)  Physical Exam  General:  Alert, well-developed, well-nourished, and well-hydrated.  Hard of hearing, even with two hearing aids. Head:  Normocephalic and atraumatic without obvious abnormalities. No apparent alopecia or balding, mild thinning. Sinuses NT. Eyes:  Conjunctiva clear bilaterally.  Ears:  External ear exam shows no significant lesions or deformities.  Otoscopic examination reveals clear canals, tympanic membranes are intact bilaterally without bulging, retraction, inflammation or discharge. Hearing is decreased  bilaterally, evebn with hearing aids bilat. Nose:  External nasal examination shows no deformity or inflammation. Nasal mucosa are pink and moist without lesions or exudates. Mild discharge that is clear. Mouth:  Oral mucosa and oropharynx without lesions or exudates.  Teeth in good repair. Mild clear PND. Neck:  No deformities, masses, or tenderness noted. Lungs:  Normal respiratory effort, chest expands symmetrically. Lungs are clear to auscultation, no crackles or wheezes. Heart:  Normal rate and regular rhythm. S1 and S2 normal without gallop, murmur, click, rub or other extra sounds. Extremities:  No clubbing, cyanosis, edema, or deformity noted with normal full range of motion of all joints.  Impression & Recommendations:  Problem # 1:  HYPERTENSION (ICD-401.9) Assessment Unchanged Do not think Lisinopril causing his cough...continue. Stay off lasix. The following medications were removed from the medication list:    Furosemide 20 Mg Tabs (Furosemide) .Marland Kitchen... Take one by mouth daily as needed His updated medication list for this problem includes:     Lisinopril-hydrochlorothiazide 10-12.5 Mg Tabs (Lisinopril-hydrochlorothiazide) .Marland Kitchen... 1 daily by mouth  BP today: 134/72 Prior BP: 124/64 (07/18/2009)  Labs Reviewed: K+: 4.7 (06/27/2009) Creat: : 1.3 (06/27/2009)   Chol: 212 (06/27/2009)   HDL: 45.30 (06/27/2009)   LDL: DEL (07/17/2008)   TG: 125.0 (06/27/2009)  Problem # 2:  GERD (ICD-530.81) Assessment: Unchanged Controlled with use of Zantac.  Complete Medication List: 1)  Timoptic 0.5 % Soln (Timolol maleate) .Marland Kitchen.. 1 drop twice a day to eyes 2)  Lisinopril-hydrochlorothiazide 10-12.5 Mg Tabs (Lisinopril-hydrochlorothiazide) .Marland Kitchen.. 1 daily by mouth  Patient Instructions: 1)  RTC 2/12 for Comp Exam, labs prior   Orders Added: 1)  Est. Patient Level III [16109]    Current Allergies (reviewed today): ! ATENOLOL (ATENOLOL) NEXIUM (ESOMEPRAZOLE MAGNESIUM) FLOXIN OTIC SINGLES * VIOXX (ROFECOXIB)

## 2010-07-03 NOTE — Assessment & Plan Note (Signed)
Summary: KIDNEY PROBLEMS/NT   Vital Signs:  Patient profile:   77 year old male Height:      68.5 inches Weight:      204.50 pounds BMI:     30.75 Temp:     97.5 degrees F oral Pulse rate:   80 / minute Pulse rhythm:   regular BP sitting:   132 / 54  (left arm) Cuff size:   large  Vitals Entered By: Delilah Shan CMA Duncan Dull) (May 28, 2010 10:30 AM) CC: ? kidney problems   History of Present Illness: Pt here with wife for urintaing all night. He has had this before. He gets to bed at 1030/11PM. Then gets up every hour. His back hurts him and wakes him up. He then urinates and the pain allows him to go back to sleep. The real question is whether his back wakes him up or whether he wakes up needing to urinate. He "doesn't go much" when he goes and feels empty when back to bed, gets to sleep easily each time. He has back pain in bed, not really anywhere else. He has no dysuria, pain with urination or feeelings of fever or chills. He has been on Flomax before with good results but stopped it due to price. He is tolerating his other medications without difficulty.  Problems Prior to Update: 1)  Back Strain, Lumbar Lateral  (ICD-847.2) 2)  Gerd  (ICD-530.81) 3)  Special Screening Malig Neoplasms Other Sites  (ICD-V76.49) 4)  Hip Pain(DEGEN JOINT DZ,SEVERE OSTEOARTH OF SPINE)  (ICD-719.45) 5)  Renal Cyst,prost Bx Negative (HUMPHRIES)  (ICD-593.2) 6)  Hypercholesterolemia/trig 216/217  (ICD-272.0) 7)  Diverticulosis, Colon  (ICD-562.10) 8)  Hiatal Hernia  (ICD-553.3) 9)  Prostatitis, Hx of  (ICD-V13.09) 10)  Arthritis, Multiple Joint Injuries  (ICD-716.90) 11)  Degenerative Joint Disease  (ICD-715.90) 12)  Benign Prostatic Hypertrophy  (ICD-600.00) 13)  Hypertension  (ICD-401.9) 14)  Diabetes Mellitus, Type II  (ICD-250.00)  Medications Prior to Update: 1)  Timoptic 0.5 %  Soln (Timolol Maleate) .Marland Kitchen.. 1 Drop Twice A Day To Eyes 2)  Lisinopril-Hydrochlorothiazide 10-12.5 Mg Tabs  (Lisinopril-Hydrochlorothiazide) .Marland Kitchen.. 1 Daily By Mouth  Current Medications (verified): 1)  Timoptic 0.5 %  Soln (Timolol Maleate) .Marland Kitchen.. 1 Drop Twice A Day To Eyes 2)  Lisinopril-Hydrochlorothiazide 10-12.5 Mg Tabs (Lisinopril-Hydrochlorothiazide) .Marland Kitchen.. 1 Daily By Mouth  Allergies: 1)  ! Atenolol (Atenolol) 2)  Nexium (Esomeprazole Magnesium) 3)  Floxin Otic Singles 4)  * Vioxx (Rofecoxib)  Physical Exam  General:  Alert, well-developed, well-nourished, and well-hydrated.  Hard of hearing, even with two hearing aids. Head:  Normocephalic and atraumatic without obvious abnormalities. No apparent alopecia or balding, mild thinning. Sinuses NT. Eyes:  Conjunctiva clear bilaterally.  Ears:  External ear exam shows no significant lesions or deformities.  Otoscopic examination reveals clear canals, tympanic membranes are intact bilaterally without bulging, retraction, inflammation or discharge. Hearing is decreased  bilaterally, evebn with hearing aids bilat. Nose:  External nasal examination shows no deformity or inflammation. Nasal mucosa are pink and moist without lesions or exudates. Mild discharge that is clear. Mouth:  Oral mucosa and oropharynx without lesions or exudates.  Teeth in good repair. Mild clear PND. Neck:  No deformities, masses, or tenderness noted. Lungs:  Normal respiratory effort, chest expands symmetrically. Lungs are clear to auscultation, no crackles or wheezes. Heart:  Normal rate and regular rhythm. S1 and S2 normal without gallop, murmur, click, rub or other extra sounds. Abdomen:  Bowel sounds positive,abdomen soft and  non-tender without masses, organomegaly or hernias noted. No suprapubic tenderness. Msk:  No CVAT.   Impression & Recommendations:  Problem # 1:  NOCTURIA (ZOX-096.04) Assessment New The pt's perception of nocturia I think is actually waking up from back pain and then urinating because he is awake, not necessarily needing to go. Will try treating  discomfort that seems overall mild...see instructions. He also has some constipatiuon that might contribute and will try treating that conservatively as well.  Complete Medication List: 1)  Timoptic 0.5 % Soln (Timolol maleate) .Marland Kitchen.. 1 drop twice a day to eyes 2)  Lisinopril-hydrochlorothiazide 10-12.5 Mg Tabs (Lisinopril-hydrochlorothiazide) .Marland Kitchen.. 1 daily by mouth  Other Orders: UA Dipstick W/ Micro (manual) (54098)  Patient Instructions: 1)  Take Tyl (Acetaminophen) 500mg  2 tabs by mouth three times a day.  2)  Take Generic Metamucil (Equate Brand) one teaspoon in 8 oz of water every morning.   Orders Added: 1)  UA Dipstick W/ Micro (manual) [81000] 2)  Est. Patient Level III [11914]    Current Allergies (reviewed today): ! ATENOLOL (ATENOLOL) NEXIUM (ESOMEPRAZOLE MAGNESIUM) FLOXIN OTIC SINGLES * VIOXX (ROFECOXIB) Laboratory Results   Urine Tests  Date/Time Received: May 28, 2010 11:10 AM   Routine Urinalysis   Color: yellow Appearance: Clear Glucose: negative   (Normal Range: Negative) Bilirubin: negative   (Normal Range: Negative) Ketone: negative   (Normal Range: Negative) Spec. Gravity: 1.020   (Normal Range: 1.003-1.035) Blood: negative   (Normal Range: Negative) pH: 6.5   (Normal Range: 5.0-8.0) Protein: trace   (Normal Range: Negative) Urobilinogen: 0.2   (Normal Range: 0-1) Nitrite: negative   (Normal Range: Negative) Leukocyte Esterace: negative   (Normal Range: Negative)  Urine Microscopic WBC/HPF: 0-1 RBC/HPF: 0 Bacteria/HPF: 0 Epithelial/HPF: rare

## 2010-07-03 NOTE — Progress Notes (Signed)
Summary: regarding lisinopril  Phone Note From Pharmacy Message from:  [employer]  Caller: [facility] [place] Summary of Call: Refill request for lisinopril, this is no longer on med list- now listed as lisinopril/ hctz.  Please advise Initial call taken by: Lowella Petties CMA,  June 08, 2009 11:53 AM  Follow-up for Phone Call        It appears that he is on HCTZ/Lisinopril now not pure lisinopril according to last office notes.  I would stick with that and not refill lisinopril alone. I am going to forward to Dr. Hetty Ely, but let them know he will be gone this week for an emergency. Follow-up by: Hannah Beat MD,  June 09, 2009 10:08 AM  Additional Follow-up for Phone Call Additional follow up Details #1::        Pharmacy notified as instructed by telephone. Sydell Axon LPN  June 10, 2009 9:06 AM     Prescriptions: LISINOPRIL-HYDROCHLOROTHIAZIDE 10-12.5 MG TABS (LISINOPRIL-HYDROCHLOROTHIAZIDE) 1 daily by mouth  #30 x 12   Entered and Authorized by:   Shaune Leeks MD   Signed by:   Shaune Leeks MD on 06/10/2009   Method used:   Electronically to        [facility]  [address] (retail)       [address]       [place], [place]  [address]       Ph: [phone]       Fax: [phone]   RxID:   [id number]

## 2010-07-03 NOTE — Letter (Signed)
Summary: Results Follow up Letter  [place] [facility]  [address] [place], [place] [address]   Phone: [phone]  Fax: [phone]    07/24/2009 MRN: [id number]  [facility] [address] [place], [place]  [address]  Dear Mr. PRAJAPATI,  The following are the results of your recent test(s):  Test         Result    Pap Smear:        Normal _____  Not Normal _____ Comments: ______________________________________________________ Cholesterol: LDL(Bad cholesterol):         Your goal is less than:         HDL (Good cholesterol):       Your goal is more than: Comments:  ______________________________________________________ Mammogram:        Normal _____  Not Normal _____ Comments:  ___________________________________________________________________ Hemoccult:        Normal __X___  Not normal _______ Comments:  Please repeat in one year.  _____________________________________________________________________ Other Tests:    We routinely do not discuss normal results over the telephone.  If you desire a copy of the results, or you have any questions about this information we can discuss them at your next office visit.   Sincerely,     Laurita Quint, MD

## 2010-07-10 ENCOUNTER — Encounter: Payer: Self-pay | Admitting: Family Medicine

## 2010-07-10 ENCOUNTER — Encounter (INDEPENDENT_AMBULATORY_CARE_PROVIDER_SITE_OTHER): Payer: Medicare Other | Admitting: Family Medicine

## 2010-07-10 DIAGNOSIS — K5730 Diverticulosis of large intestine without perforation or abscess without bleeding: Secondary | ICD-10-CM

## 2010-07-10 DIAGNOSIS — I10 Essential (primary) hypertension: Secondary | ICD-10-CM

## 2010-07-10 DIAGNOSIS — E559 Vitamin D deficiency, unspecified: Secondary | ICD-10-CM

## 2010-07-10 DIAGNOSIS — J301 Allergic rhinitis due to pollen: Secondary | ICD-10-CM

## 2010-07-10 DIAGNOSIS — E119 Type 2 diabetes mellitus without complications: Secondary | ICD-10-CM

## 2010-07-17 NOTE — Assessment & Plan Note (Signed)
Summary: CPX/ DLO   Vital Signs:  Patient profile:   77 year old male Weight:      203.25 pounds Temp:     97.5 degrees F oral Pulse rate:   64 / minute Pulse rhythm:   regular BP sitting:   148 / 72  (left arm) Cuff size:   large  Vitals Entered By: Sydell Axon LPN (July 10, 2010 2:47 PM) CC: 30 Minute checkup   History of Present Illness: Pt here with his wife. He is doing well. He is getting up 2-3 timews at night, better than when he was here before. He got over his congestion. The wife had it first. He feels well and has no complaints.   Preventive Screening-Counseling & Management  Alcohol-Tobacco     Alcohol drinks/day: 0     Smoking Status: never     Passive Smoke Exposure: no  Caffeine-Diet-Exercise     Caffeine use/day: 2     Does Patient Exercise: yes     Type of exercise: walks and gym at home     Exercise (avg: min/session): <30     Times/week: 4  Problems Prior to Update: 1)  Unspecified Vitamin D Deficiency  (ICD-268.9) 2)  Allergic Rhinitis, Seasonal  (ICD-477.0) 3)  Nocturia  (ICD-788.43) 4)  Back Strain, Lumbar Lateral  (ICD-847.2) 5)  Gerd  (ICD-530.81) 6)  Special Screening Malig Neoplasms Other Sites  (ICD-V76.49) 7)  Hip Pain(DEGEN JOINT DZ,SEVERE OSTEOARTH OF SPINE)  (ICD-719.45) 8)  Renal Cyst,prost Bx Negative (HUMPHRIES)  (ICD-593.2) 9)  Hypercholesterolemia/trig 216/217  (ICD-272.0) 10)  Diverticulosis, Colon  (ICD-562.10) 11)  Hiatal Hernia  (ICD-553.3) 12)  Prostatitis, Hx of  (ICD-V13.09) 13)  Arthritis, Multiple Joint Injuries  (ICD-716.90) 14)  Degenerative Joint Disease  (ICD-715.90) 15)  Benign Prostatic Hypertrophy  (ICD-600.00) 16)  Hypertension  (ICD-401.9) 17)  Diabetes Mellitus, Type II  (ICD-250.00)  Medications Prior to Update: 1)  Timoptic 0.5 %  Soln (Timolol Maleate) .Marland Kitchen.. 1 Drop Twice A Day To Eyes 2)  Lisinopril-Hydrochlorothiazide 10-12.5 Mg Tabs (Lisinopril-Hydrochlorothiazide) .Marland Kitchen.. 1 Daily By Mouth  Current  Medications (verified): 1)  Timoptic 0.5 %  Soln (Timolol Maleate) .Marland Kitchen.. 1 Drop Twice A Day To Eyes 2)  Lisinopril-Hydrochlorothiazide 10-12.5 Mg Tabs (Lisinopril-Hydrochlorothiazide) .Marland Kitchen.. 1 Daily By Mouth  Allergies: 1)  ! Atenolol (Atenolol) 2)  Nexium (Esomeprazole Magnesium) 3)  Floxin Otic Singles 4)  * Vioxx (Rofecoxib)  Past History:  Past Medical History: Last updated: 12/30/2006 Diabetes mellitus, type II Hypertension Benign prostatic hypertrophy Diverticulosis, colon  Past Surgical History: Last updated: 07/27/2007 BE Diverticuli  07/1991 Prostate bx- benign, cystoscopy  wnl 07/13/98 Shoulder surgery right, [place] 04/03 Cataract left eye removed 04/01, right removed 01/04 CT abd with and without right adrenal adenoma-stable 08/14/05 CT Pelvis with wnl 08/14/05  Family History: Last updated: 07/04/2009 Father: dec 100yoa natural causes Mother: dec 78  stomach cancer 3 Brothers, 2 with back operations  1 dec 86 Lung Ca                                             2 Sisters A  one with back operation. 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 Eyes:  Denies blurring, discharge, and eye pain; vision decreaqsing...eye doctor 2 weeks ago, doing fine. ENT:  Complains of decreased hearing and ringing in ears; denies earache. CV:  Complains of swelling of feet; denies chest pain or discomfort, fainting, palpitations, shortness of breath with exertion, and swelling of hands; mild ankle swelling. Resp:  Denies cough and wheezing. GI:   Denies abdominal pain, bloody stools, change in bowel habits, constipation, dark tarry stools, diarrhea, indigestion, loss of appetite, nausea, vomiting, vomiting blood, and yellowish skin color. GU:  Complains of nocturia and urinary frequency; denies dysuria. MS:  Complains of low back pain; denies joint pain, muscle aches, cramps, and stiffness. Derm:  Complains of dryness; denies itching and rash. Neuro:  Denies numbness, poor balance, tingling, and tremors.  Physical Exam  General:  Alert, well-developed, well-nourished, and well-hydrated.  Hard of hearing, even with two hearing aids. Slightly stooped and broad based gait. Head:  Normocephalic and atraumatic without obvious abnormalities. No apparent alopecia, mild thinning and balding. Sinuses NT. Eyes:  Conjunctiva clear bilaterally bulbar, palpebral inflamed without dischaarge..  Ears:  External ear exam shows no significant lesions or deformities.  Otoscopic examination reveals clear canals, tympanic membranes are intact bilaterally without bulging, retraction, inflammation or discharge. Hearing is decreased, even with hearing aids bilat. Nose:  External nasal examination shows no deformity or inflammation. Nasal mucosa are pink and moist without lesions or exudates. Clear discharge. Mouth:  Oral mucosa and oropharynx without lesions or exudates.  Teeth in good repair. Mild clear PND. Neck:  No deformities, masses, or tenderness noted. Chest Wall:  No deformities, masses, tenderness or gynecomastia noted. Breasts:  No masses or gynecomastia noted Lungs:  Normal respiratory effort, chest expands symmetrically. Lungs are clear to auscultation, no crackles or wheezes. Heart:  Normal rate and regular rhythm. S1 and S2 normal without gallop, murmur, click, rub or other extra sounds. Abdomen:  Bowel sounds positive,abdomen soft and non-tender without masses, organomegaly or hernias noted. No suprapubic tenderness. Rectal:  Not  done. Genitalia:  Not done. Prostate:  Not done. Msk:  No deformity or scoliosis noted of thoracic or lumbar spine.  No CVAT. Pulses:  R and L carotid,radial,femoral,dorsalis pedis and posterior tibial pulses are full and equal bilaterally Extremities:  No clubbing, cyanosis, edema, or deformity noted with reasonable  range of motion of all joints.   Neurologic:  No cranial nerve deficits noted. Station and gait are normal. Sensory, motor and coordinative functions appear intact. Skin:  Intact without suspicious lesions or rashes, AKs and SKs throughout. Skin generally dry and flaky. Cervical Nodes:  No lymphadenopathy noted Inguinal Nodes:  No significant adenopathy Psych:  Cognition and judgment appear intact. Alert and cooperative with normal attention span and concentration. No apparent delusions, illusions, hallucinations  Diabetes Management Exam:    Foot Exam (with socks and/or shoes not present):       Sensory-Pinprick/Light touch:          Left medial foot (L-4): diminished          Left dorsal foot (L-5): diminished          Left lateral foot (S-1): diminished          Right medial foot (L-4): diminished          Right dorsal foot (L-5): diminished  Right lateral foot (S-1): diminished       Sensory-Monofilament:          Left foot: diminished          Right foot: diminished       Inspection:          Left foot: abnormal             Comments: Dry flaky skin.          Right foot: abnormal             Comments: Dry flaky skin.       Nails:          Left foot: thickened          Right foot: thickened    Eye Exam:       Eye Exam done elsewhere          Date: 06/26/2010          Results: normal          Done by: Dr Roda Shutters   Impression & Recommendations:  Problem # 1:  UNSPECIFIED VITAMIN D DEFICIENCY (ICD-268.9) Assessment Unchanged Start Vit D 2000Iu daily (this is what his wife also takes)  Problem # 2:  ALLERGIC RHINITIS, SEASONAL (ICD-477.0) Assessment:  Unchanged Drips off and on variously...declines medication.  Problem # 3:  NOCTURIA (EXB-284.13) Assessment: Improved Treating back pain with Tyl has improved his getting up.  Problem # 4:  HYPERCHOLESTEROLEMIA/TRIG 216/217 (ICD-272.0) Assessment: Unchanged Afdequate nos but try to get LDL lower by watching fatty food intake. Labs Reviewed: SGOT: 20 (07/02/2010)   SGPT: 18 (07/02/2010)   HDL:41.20 (07/02/2010), 45.30 (06/27/2009)  LDL:115 (07/02/2010), DEL (07/17/2008)  Chol:182 (07/02/2010), 212 (06/27/2009)  Trig:128.0 (07/02/2010), 125.0 (06/27/2009)  Problem # 5:  DIVERTICULOSIS, COLON (ICD-562.10) Assessment: Unchanged Again encouraged to come in for prolonged LLQ discomfort.  Problem # 6:  HYPERTENSION (ICD-401.9) Assessment: Unchanged Slightly elevated. Will folllow as this is the only time in the last few years that is has been high. Wiill adjust next if still high. His updated medication list for this problem includes:    Lisinopril-hydrochlorothiazide 10-12.5 Mg Tabs (Lisinopril-hydrochlorothiazide) .Marland Kitchen... 1 daily by mouth  BP today: 148/72 Prior BP: 128/68 (06/05/2010)  Labs Reviewed: K+: 4.5 (07/02/2010) Creat: : 1.1 (07/02/2010)   Chol: 182 (07/02/2010)   HDL: 41.20 (07/02/2010)   LDL: 115 (07/02/2010)   TG: 128.0 (07/02/2010)  Problem # 7:  DIABETES MELLITUS, TYPE II (ICD-250.00) Assessment: Unchanged Adequate control. Cont meds and lifestyle. His updated medication list for this problem includes:    Lisinopril-hydrochlorothiazide 10-12.5 Mg Tabs (Lisinopril-hydrochlorothiazide) .Marland Kitchen... 1 daily by mouth  Labs Reviewed: Creat: 1.1 (07/02/2010)   Microalbumin: 2.3 (07/16/2006)  Last Eye Exam: normal (06/26/2010) Reviewed HgBA1c results: 6.8 (07/02/2010)  6.9 (06/27/2009)  Complete Medication List: 1)  Timoptic 0.5 % Soln (Timolol maleate) .Marland Kitchen.. 1 drop twice a day to eyes 2)  Lisinopril-hydrochlorothiazide 10-12.5 Mg Tabs (Lisinopril-hydrochlorothiazide) .Marland Kitchen.. 1  daily by mouth  Patient Instructions: 1)  RTC 6 mos A1C prior 250.00   Orders Added: 1)  Est. Patient Level IV [24401]    Current Allergies (reviewed today): ! ATENOLOL (ATENOLOL) NEXIUM (ESOMEPRAZOLE MAGNESIUM) FLOXIN OTIC SINGLES * VIOXX (ROFECOXIB)

## 2010-10-17 NOTE — Op Note (Signed)
[place]. [facility]  Patient:    Ronald Ball, Ronald Ball Visit Number: [id number] MRN: [id number]          Service Type: DSU Location: [facility] Attending Physician:  Randolm Idol Dictated by:   Claude Manges. Cleophas Dunker, M.D. Proc. Date: 07/07/01 Admit Date:  07/07/2001                             Operative Report  PREOPERATIVE DIAGNOSIS:  Rotator cuff tear with impingement right shoulder. Degenerative joint disease of acromioclavicular joint.  POSTOPERATIVE DIAGNOSIS:  Rotator cuff tear with impingement right shoulder. Degenerative joint disease of acromioclavicular joint.  Degenerative tears of biceps tendon.  PROCEDURE:  Diagnostic arthroscopy of right shoulder with arthroscopic debridement of biceps tendon.  Open rotator cuff tear repair with acromioplasty.  Open resection of distal clavicle.  SURGEON:  Claude Manges. Cleophas Dunker, M.D.  ANESTHESIA:  General orotracheal.  COMPLICATIONS:  None.  INDICATIONS:  An 77 year old gentleman slipped and fell on the ice in early December and has been experiencing trouble with his right shoulder since that time, ie, approximately two months.  He is having trouble raising his arm over his head and sleeping on the right side and has had a compromise of his activities.  At 77, he is quite healthy and independent and is having difficulty remaining independent.  He did have an MRI scan that revealed a full thickness and partially retracted tear of the supraspinatous tendon, partially tearing at the intra-articular portion of the long end of the biceps tendon.  There was a possible intra-articular loose body.  He is now to have arthroscopic evaluation and open rotator cuff tear repair.  DESCRIPTION OF PROCEDURE:  With the patient comfortable on the operating table and under general endotracheal anesthesia, the patient was placed in a semisitting position on the shoulder frame.  The right shoulder was then prepped with  Duraprep from the base of the neck circumferentially to below the elbow.  Sterile draping was performed.  A marking pen was used to outline the acromion and the [facility] joint and the corocoid at a point of fingerbreadth inferior and medial to the posterior angle of the acromion a small stab wound was made.  Prior to which point 0.25% Marcaine with epinephrine was injected.  The arthroscope was then easily placed into the shoulder joint.  Diagnostic arthroscopy revealed an intact labrum.  There was very minimal degenerative tearing.  There was a tear of the biceps tendon longitudinally.  A second portal was established anteriorly and shaving of the biceps tendon was performed.  There was an obvious rotator cuff tear with chronic frayed edges.  I did not see any evidence of a loose body. There were mild chondromalacia changes of both the femoral head and the glenoid.  An open rotator cuff tear repair was then performed.  An incision was made beginning at the midportion of the [facility] joint coarsing anteriorly and inferiorly.  Via sharp dissection, the incision was carried down to the subcutaneous tissue.  The [facility] joint was identified as capsule, incised, and then carried further distally through the deltoid fascia.  Self retaining retractor was inserted.  The subperiosteal dissection along the distal clavicle was performed and distal clavicle resection was then performed with an oscillating saw.  Bone wax was applied to the bleeding bone surface. There was still considerable impingement beneath the remaining clavicle with a large spur, so this was removed with a rongeur  and bone wax applied to the bleeding edges.  An anterior inferior acromioplasty was performed with the oscillating saw and finished with a rasp.  Bone wax was applied to the bleeding bone surface.  I thought he had an excellent decompression.  The rotator cuff was then evaluated.  There was a chronic tear involving  the supraspinatous tendon beginning just lateral to the biceps groove, extending approximately an inch and a half to two inches with portions of the infraspinatous muscle involved.  The edges were slightly retracted, perhaps 5 to 6 mm.  The edges were rounded.  The edges were sharply debrided with a 15 blade knife and in areas where I could suture tendon to tendon this was performed with 0 Tycron suture.  One Mytek anchor was inserted to supplement the repair.  The arm was then placed through a full range of motion. There was no retraction or tension across the repair.  The wound was copiously irrigated with saline solution.  Deltoid fascia was closed anatomically with a running 0 Vicryl with subcu with 2-0 Vicryl, skin closed with skin clips.  0.25% Marcaine without epinephrine was injected into the wound edges.  Sterile bulky dressing was applied followed by a sling.  The patient tolerated the procedure without complications.  Plan recovery care, discharge in the morning with sling and Percocet.  Office in one week. Dictated by:   Claude Manges. Cleophas Dunker, M.D. Attending Physician:  Randolm Idol DD:  07/07/01 TD:  07/08/01 Job: [id number]

## 2010-11-10 ENCOUNTER — Ambulatory Visit (INDEPENDENT_AMBULATORY_CARE_PROVIDER_SITE_OTHER): Payer: Medicare Other | Admitting: Family Medicine

## 2010-11-10 ENCOUNTER — Encounter: Payer: Self-pay | Admitting: Family Medicine

## 2010-11-10 DIAGNOSIS — M1990 Unspecified osteoarthritis, unspecified site: Secondary | ICD-10-CM

## 2010-11-10 DIAGNOSIS — M549 Dorsalgia, unspecified: Secondary | ICD-10-CM

## 2010-11-10 NOTE — Assessment & Plan Note (Signed)
Back pain slightly accelerated. Will try NSAID instead of ASA. Pain medication constipates him and makes situation worse. See instructions.

## 2010-11-10 NOTE — Patient Instructions (Signed)
Try taking one Aleve every nite after supper for 2 weeks. Then take at bedtime if needed with food.  RTC 3 weeks for BP recheck.

## 2010-11-10 NOTE — Progress Notes (Signed)
  Subjective:    Patient ID: Ronald Ball, male    DOB: Mar 04, 1914, 77 y.o.   MRN: [id number]  HPI Pt here with his wife as acute appt for back pain. He has difficulty getting to sleep due to lower back pain and couldn't get to sleep until he took an aspirin. He has no new sxs or complaints and otherwise feels well.    Review of Systems  Constitutional: Negative for fever, chills, diaphoresis, activity change, appetite change and fatigue.  HENT: Negative for hearing loss, ear pain, congestion, sore throat, rhinorrhea, neck pain, neck stiffness, postnasal drip, sinus pressure, tinnitus and ear discharge.   Eyes: Negative for pain, discharge and visual disturbance.  Respiratory: Negative for cough, shortness of breath and wheezing.   Cardiovascular: Negative for chest pain and palpitations.       No SOB w/ exertion  Gastrointestinal:       No heartburn or swallowing problems.  Genitourinary:       No nocturia  Musculoskeletal: Positive for back pain and arthralgias. Negative for gait problem.       No new sxs but intensification of back pain at night per HPI.  Skin:       No itching or dryness.  Neurological:       No tingling or balance problems.  All other systems reviewed and are negative.       Objective:   Physical Exam  Constitutional: He appears well-developed and well-nourished. No distress.  HENT:  Head: Normocephalic and atraumatic.  Right Ear: External ear normal.  Left Ear: External ear normal.  Nose: Nose normal.  Mouth/Throat: Oropharynx is clear and moist.  Eyes: Conjunctivae and EOM are normal. Pupils are equal, round, and reactive to light. Right eye exhibits no discharge. Left eye exhibits no discharge.  Neck: Normal range of motion. Neck supple.  Cardiovascular: Normal rate and regular rhythm.   Pulmonary/Chest: Effort normal and breath sounds normal. He has no wheezes.  Musculoskeletal:       Mild decreased ROM , mostly age related. No acute changes.  No radiculopathy.  Lymphadenopathy:    He has no cervical adenopathy.  Skin: He is not diaphoretic.          Assessment & Plan:

## 2010-11-29 ENCOUNTER — Encounter: Payer: Self-pay | Admitting: Family Medicine

## 2010-12-04 ENCOUNTER — Ambulatory Visit (INDEPENDENT_AMBULATORY_CARE_PROVIDER_SITE_OTHER): Payer: Medicare Other | Admitting: Family Medicine

## 2010-12-04 ENCOUNTER — Encounter: Payer: Self-pay | Admitting: Family Medicine

## 2010-12-04 DIAGNOSIS — M129 Arthropathy, unspecified: Secondary | ICD-10-CM

## 2010-12-04 DIAGNOSIS — M1990 Unspecified osteoarthritis, unspecified site: Secondary | ICD-10-CM

## 2010-12-04 NOTE — Assessment & Plan Note (Signed)
See above

## 2010-12-04 NOTE — Progress Notes (Signed)
  Subjective:    Patient ID: Ronald Ball, male    DOB: 04-23-1914, 77 y.o.   MRN: [id number]  HPI Pt here with son  for followup of acute back pain. Wife incapacitated at home with her own LBP with herniated disc Dr Cleophas Dunker has determined will try to treat with spinal injection. SHe was started on NSAIDs last time which he doesn't think helped a whole lot except taking an Advil at bedtime has allowed him to sleep all night. He realizes that at 21 we do not want to be overly aggressive. He has been seen and discussed the situation with Dr Cleophas Dunker. The pt and his son are both realistic with trmt and goals.    Review of SystemsNoncontributory except as above.      Objective:   Physical Exam  Constitutional: He appears well-developed and well-nourished. No distress.  HENT:  Head: Normocephalic and atraumatic.  Right Ear: External ear normal.  Left Ear: External ear normal.  Nose: Nose normal.  Mouth/Throat: Oropharynx is clear and moist.  Eyes: Conjunctivae and EOM are normal. Pupils are equal, round, and reactive to light. Right eye exhibits no discharge. Left eye exhibits no discharge.  Neck: Normal range of motion. Neck supple.  Cardiovascular: Normal rate and regular rhythm.   Pulmonary/Chest: Effort normal and breath sounds normal. He has no wheezes.  Lymphadenopathy:    He has no cervical adenopathy.  Skin: He is not diaphoretic.          Assessment & Plan:

## 2010-12-04 NOTE — Assessment & Plan Note (Signed)
Back pain continues, helped slightly by judicious NSAID trmt. Discussed at length as well as using heat and ice and topical agents.

## 2010-12-25 ENCOUNTER — Other Ambulatory Visit: Payer: Self-pay | Admitting: Family Medicine

## 2010-12-25 DIAGNOSIS — E119 Type 2 diabetes mellitus without complications: Secondary | ICD-10-CM

## 2010-12-31 ENCOUNTER — Other Ambulatory Visit (INDEPENDENT_AMBULATORY_CARE_PROVIDER_SITE_OTHER): Payer: Medicare Other | Admitting: Family Medicine

## 2010-12-31 DIAGNOSIS — E119 Type 2 diabetes mellitus without complications: Secondary | ICD-10-CM

## 2011-01-08 ENCOUNTER — Encounter: Payer: Self-pay | Admitting: Family Medicine

## 2011-01-08 ENCOUNTER — Ambulatory Visit (INDEPENDENT_AMBULATORY_CARE_PROVIDER_SITE_OTHER): Payer: Medicare Other | Admitting: Family Medicine

## 2011-01-08 DIAGNOSIS — E119 Type 2 diabetes mellitus without complications: Secondary | ICD-10-CM

## 2011-01-08 DIAGNOSIS — I10 Essential (primary) hypertension: Secondary | ICD-10-CM

## 2011-01-08 DIAGNOSIS — M1990 Unspecified osteoarthritis, unspecified site: Secondary | ICD-10-CM

## 2011-01-08 NOTE — Assessment & Plan Note (Signed)
Back slightly better. Cont heat and ice and Tyl, Aleve sparingly when absolutely needed.

## 2011-01-08 NOTE — Progress Notes (Signed)
  Subjective:    Patient ID: Ronald Ball, male    DOB: Mar 27, 1914, 77 y.o.   MRN: [id number]  HPI Pt here with his son for six month followup. His A1C in Feb was 6.8, today 6.7. He was last seen one month ago c/o back pain with his wife having same. She has been to the hosp 4 times last month for this. She had cardiology eval this AM prior to poss surgical intervention. He feels well. His back bothers him some but he is doing well and understands that at his age, we want to be very conservative. Conservative measures have helped.     Review of SystemsNoncontributory except as above.       Objective:   Physical Exam  Constitutional: He appears well-developed and well-nourished. No distress.  HENT:  Head: Normocephalic and atraumatic.  Right Ear: External ear normal.  Left Ear: External ear normal.  Nose: Nose normal.  Mouth/Throat: Oropharynx is clear and moist.  Eyes: Conjunctivae and EOM are normal. Pupils are equal, round, and reactive to light. Right eye exhibits no discharge. Left eye exhibits no discharge.  Neck: Normal range of motion. Neck supple.  Cardiovascular: Normal rate and regular rhythm.   Pulmonary/Chest: Effort normal and breath sounds normal. He has no wheezes.  Lymphadenopathy:    He has no cervical adenopathy.  Skin: He is not diaphoretic.          Assessment & Plan:

## 2011-01-08 NOTE — Patient Instructions (Signed)
FRTC for Comp Exam and get acquainted appt 2/13, labs prior.

## 2011-01-08 NOTE — Assessment & Plan Note (Signed)
Good control and improved over 6 mos ago. He was congratulated and told to continue as he has been. Recheck in 6 months. Lab Results  Component Value Date   HGBA1C 6.7* 12/31/2010

## 2011-01-08 NOTE — Assessment & Plan Note (Signed)
Good control. Cont curr meds. BP Readings from Last 3 Encounters:  01/08/11 138/70  12/04/10 122/64  11/10/10 134/70

## 2011-06-11 ENCOUNTER — Encounter: Payer: Self-pay | Admitting: Family Medicine

## 2011-06-11 ENCOUNTER — Ambulatory Visit (INDEPENDENT_AMBULATORY_CARE_PROVIDER_SITE_OTHER): Payer: Medicare Other | Admitting: Family Medicine

## 2011-06-11 VITALS — BP 136/78 | HR 76 | Temp 98.0°F | Ht 70.0 in | Wt 202.5 lb

## 2011-06-11 DIAGNOSIS — R21 Rash and other nonspecific skin eruption: Secondary | ICD-10-CM

## 2011-06-11 MED ORDER — TRIAMCINOLONE ACETONIDE 0.1 % EX CREA: TOPICAL_CREAM | Freq: Two times a day (BID) | CUTANEOUS | Status: DC

## 2011-06-11 NOTE — Assessment & Plan Note (Signed)
Anticipate mild irritant vs contact dermatitis. Treat with TCI cream. Update if not improving. Discussed ultimate goal is remove offending agent (?elastic from underwear).

## 2011-06-11 NOTE — Patient Instructions (Addendum)
I think this may be coming from the elastic material of the underwear. May do mid potency steroid cream twice daily for 1- 2weeks. ultimate treatment will be to remove offending agent (?elastic) Let me know if not improving with the steroids. Consider starting baby aspirin (81mg ) daily.

## 2011-06-11 NOTE — Progress Notes (Signed)
  Subjective:    Patient ID: Ronald Ball, male    DOB: Sep 07, 1913, 78 y.o.   MRN: [id number]  HPI CC: itching around waist  Several month history of itching around waist.  Worse at night.  Uses gold bond and this helps itching but then next day it's back.  When rubs baby oil on skin, no itching for the whole day.  Doesn't scratch.  Mild rash.  No other rash elsewhere, no oral lesions  No new lotions, detergents, soaps, shampoos, medicines.  Uses 100% cotton underwear.  Review of Systems Per HPI    Objective:   Physical Exam  Nursing note and vitals reviewed. Constitutional: He appears well-developed and well-nourished. No distress.  Skin: Skin is warm and dry. Rash noted.          Mild macular rash bilateral waistline where elastic rubs against skin       Assessment & Plan:

## 2011-07-10 ENCOUNTER — Other Ambulatory Visit: Payer: Medicare Other

## 2011-07-17 ENCOUNTER — Encounter: Payer: Medicare Other | Admitting: Family Medicine

## 2011-09-08 ENCOUNTER — Other Ambulatory Visit: Payer: Self-pay | Admitting: Family Medicine

## 2011-10-08 ENCOUNTER — Encounter: Payer: Self-pay | Admitting: Family Medicine

## 2011-10-08 ENCOUNTER — Ambulatory Visit (INDEPENDENT_AMBULATORY_CARE_PROVIDER_SITE_OTHER): Payer: Medicare Other | Admitting: Family Medicine

## 2011-10-08 VITALS — BP 130/64 | HR 20 | Temp 97.6°F | Wt 202.8 lb

## 2011-10-08 DIAGNOSIS — I10 Essential (primary) hypertension: Secondary | ICD-10-CM

## 2011-10-08 DIAGNOSIS — E119 Type 2 diabetes mellitus without complications: Secondary | ICD-10-CM

## 2011-10-08 DIAGNOSIS — N400 Enlarged prostate without lower urinary tract symptoms: Secondary | ICD-10-CM

## 2011-10-08 DIAGNOSIS — R300 Dysuria: Secondary | ICD-10-CM

## 2011-10-08 LAB — BASIC METABOLIC PANEL
BUN: 24 mg/dL — ABNORMAL HIGH (ref 6–23)
CO2: 29 mEq/L (ref 19–32)
Calcium: 9.1 mg/dL (ref 8.4–10.5)
Creatinine, Ser: 1.3 mg/dL (ref 0.4–1.5)
Glucose, Bld: 124 mg/dL — ABNORMAL HIGH (ref 70–99)

## 2011-10-08 LAB — POCT URINALYSIS DIPSTICK
Bilirubin, UA: NEGATIVE
Blood, UA: NEGATIVE
Ketones, UA: NEGATIVE
Leukocytes, UA: NEGATIVE
pH, UA: 7.5

## 2011-10-08 MED ORDER — SULFAMETHOXAZOLE-TRIMETHOPRIM 800-160 MG PO TABS: 1.0000 | ORAL_TABLET | Freq: Two times a day (BID) | ORAL | Status: AC

## 2011-10-08 NOTE — Patient Instructions (Addendum)
I would get a flu shot each fall.   We'll contact you with your lab report. Start the antibiotics today and let me know if you don't get better gradually.  Recheck in 6 months.  visit.

## 2011-10-08 NOTE — Progress Notes (Signed)
Back pain and urinary frequency of recent onset.  H/o likely prostatitis per patient.  PSA prev wnl.  Prev prostate eval unremarkable per records.  Some burning with urination.  Stream has recently gotten worse.    Hypertension:  Using medication without problems or lightheadedness: yes Chest pain with exertion:no Edema:no Short of breath:no  Diabetes:  Using medications without difficulties:no meds Hypoglycemic episodes: no sx Hyperglycemic episodes: no sx Feet problems: no Blood Sugars averaging: not checked A1c pending.   No recent falls.  Last fall was on ice in the winter time prev.  Drives, independent.  Still working on tractors.    Meds, vitals, and allergies reviewed.   PMH and SH reviewed  ROS: See HPI.  Otherwise negative.    GEN: nad, alert and oriented, hard of hearing HEENT: mucous membranes moist NECK: supple w/o LA CV: rrr. PULM: ctab, no inc wob ABD: soft, +bs EXT: no edema SKIN: no acute rash Prostate gland firm and smooth, slight enlargement on R side, but no nodularity, tenderness, mass or induration.

## 2011-10-12 NOTE — Assessment & Plan Note (Signed)
With dysuria, start abx and see notes on ucx.

## 2011-10-12 NOTE — Assessment & Plan Note (Signed)
Controlled, continue as is. Recheck later in 2013.

## 2011-10-12 NOTE — Assessment & Plan Note (Signed)
Controlled, continue current meds.   

## 2012-03-16 ENCOUNTER — Encounter: Payer: Self-pay | Admitting: Family Medicine

## 2012-03-16 ENCOUNTER — Ambulatory Visit (INDEPENDENT_AMBULATORY_CARE_PROVIDER_SITE_OTHER): Payer: Medicare Other | Admitting: Family Medicine

## 2012-03-16 VITALS — BP 122/76 | HR 72 | Temp 97.7°F | Wt 206.5 lb

## 2012-03-16 DIAGNOSIS — M79609 Pain in unspecified limb: Secondary | ICD-10-CM

## 2012-03-16 DIAGNOSIS — M79674 Pain in right toe(s): Secondary | ICD-10-CM

## 2012-03-16 NOTE — Progress Notes (Signed)
  Subjective:    Patient ID: Ronald Ball, male    DOB: Oct 30, 1913, 78 y.o.   MRN: [id number]  HPI CC: toe pain  Pleasant 77 yo with h/o HTN, DM prior pt of Dr. Lorenza Chick presents today with 1 wk h/o pain in right big toe.  Hurting to walk.  Points to 1st MTP joint.  Pain has improved, but wanted to be evaluated for this.  No h/o gout.  Denies redness or swelling of 1st MTP joint.  No fevers/chills, skin rashes, nausea/vomiting. + h/o athlete's foot, worse in summer.  Tried funginail and wonders if this caused pain. Denies trauma/injury to toe.  Past Medical History  Diagnosis Date  . BPH (benign prostatic hyperplasia)   . Diverticulosis of colon   . History of barium enema 07/1991    Diverticuli  . History of CT scan of abdomen 08/14/05    with and without right adrenal adenoma-stable  . History of CT scan 08/14/05     pelvis with wnl  . Diabetes mellitus   . Hyperlipidemia      Review of Systems Per HPI    Objective:   Physical Exam  Nursing note and vitals reviewed. Constitutional: He appears well-developed and well-nourished. No distress.  Musculoskeletal:       + hammer toes R foot. Slight discomfort with pressure at 1st MTPJ on right.  No significant erythema, edema. No pain with axial loading at big toe. Mild loss of transverse arch. Bilateral onychomycosis and onycholysis of great toes but no evidence of superinfection  Neurological:       Sensation intact  Skin: Skin is warm and dry. Rash noted.       Mild cracking of soles bilaterally       Assessment & Plan:

## 2012-03-16 NOTE — Assessment & Plan Note (Signed)
Pain at R MTPJ. Possibly gout, vs degenerative arthritis.  Doubt infectious as improved on own, no warmth or fever. Recommended continue to treat supportively, may try OTC NSAID. If recurs, would want to eval for gout. rec antifungal powder and lotrimin otc for mild bilateral tinea pedis

## 2012-03-16 NOTE — Patient Instructions (Signed)
I think this was caused by arthritis, possibly gout. May take ibuprofen or advil over the counter for discomfort. If recurs, return for blood work. For the athlete's foot - use over the counter lotrimin twice daily for 3-4 weeks and use antifungal powder in all shoes. Let us know if not improving as expected. Good to see you today, call us with questions.

## 2012-05-10 ENCOUNTER — Ambulatory Visit (INDEPENDENT_AMBULATORY_CARE_PROVIDER_SITE_OTHER): Payer: Medicare Other | Admitting: Family Medicine

## 2012-05-10 ENCOUNTER — Encounter: Payer: Self-pay | Admitting: Family Medicine

## 2012-05-10 VITALS — BP 144/84 | HR 72 | Temp 97.7°F | Wt 208.0 lb

## 2012-05-10 DIAGNOSIS — R351 Nocturia: Secondary | ICD-10-CM

## 2012-05-10 DIAGNOSIS — N400 Enlarged prostate without lower urinary tract symptoms: Secondary | ICD-10-CM

## 2012-05-10 LAB — POCT URINALYSIS DIPSTICK
Bilirubin, UA: NEGATIVE
Blood, UA: NEGATIVE
Glucose, UA: NEGATIVE
Nitrite, UA: NEGATIVE
Spec Grav, UA: 1.015
Urobilinogen, UA: 0.2

## 2012-05-10 MED ORDER — TAMSULOSIN HCL 0.4 MG PO CAPS: 0.4000 mg | ORAL_CAPSULE | Freq: Every day | ORAL | Status: DC

## 2012-05-10 NOTE — Patient Instructions (Addendum)
I think you are having symptoms from an enlarged prostate. Start flomax at 0.4mg  nightly.  Let Ronald Ball know if this doesn't help symptoms.  Let Ronald Ball know how this medicine is working. Urine looking normal today.  Benign Prostatic Hypertrophy  The prostate gland is part of the reproductive system of men. A normal prostate is about the size and shape of a walnut. The prostate gland makes a fluid that is mixed with sperm to make semen. This gland surrounds the urethra and is located in front of the rectum and just below the bladder. The bladder is where urine is stored. The urethra is the tube through which urine passes from the bladder to get out of the body. The prostate grows as a man ages. An enlarged prostate not caused by cancer is called benign prostatic hypertrophy (BPH). This is a common health problem in men over age 54. This condition is a normal part of aging. An enlarged prostate presses on the urethra. This makes it harder to pass urine. In the early stages of enlargement, the bladder can get by with a narrowed urethra by forcing the urine through. If the problem gets worse, medical or surgical treatment may be required.  This condition should be followed by your caregiver. Longstanding back pressure on the kidneys can cause infection. Back pressure and infection can progress to bladder damage and kidney (renal) failure. If needed, your caregiver may refer you to a specialist in kidney and prostate disease (urologist). CAUSES  The exact cause is not known.  SYMPTOMS   You are not able to completely empty your bladder.  Getting up often during the night to urinate.  Need to urinate frequently during the day.  Difficultly in starting urine flow.  Decrease in size and strength of the urine stream.  Dribbling after urination.  Pain on urination (more common with infection).  Inability to pass your water. This needs immediate treatment. DIAGNOSIS  These tests will help your caregiver  understand your problem:  Digital rectal exam (DRE). In a rectal exam, your caregiver checks your prostate by putting a gloved, lubricated finger into the rectum to feel the back of your prostate gland. This exam detects the size of the gland and abnormal lumps or growths.  Urinalysis (exam of the urine). This may include a culture if there is concern about infection.  Prostate Specific Antigen (PSA). This is a blood test used to screen for prostate cancer. It is not used alone for diagnosing prostate cancer.  Rectal ultrasound (sonogram). This test uses sound waves to electronically produce a "picture" of the prostate. It helps examine the prostate gland for cancer. TREATMENT  Mild symptoms may not need treatment. Simple observation and yearly exams may be all that is required. Medications and surgery are options for more severe problems. Your caregiver can help you make an informed decision for what is best. Two classes of medications are available for relief of prostate symptoms:  Medications that shrink the prostate. This helps relieve symptoms.  Uncommon side effects include problems with sexual function.  Medications to relax the muscle of the prostate. This also relieves the obstruction.  Side effects can include dizziness, fatigue, lightheadedness, and retrograde ejaculation (diminished volume of ejaculate). Several types of surgical treatments are available for relief of prostate symptoms:  Transurethral resection of the prostate (TURP). In this treatment, an instrument is inserted through opening at the tip of the penis. It is used to cut away pieces of the inner core of the  prostate. The pieces are removed through the same opening of the penis. This removes the obstruction and helps get rid of the symptoms.  Transurethral incision (TUIP). In this procedure, small cuts are made in the prostate. This lessens the prostates pressure on the urethra.  Transurethral microwave  thermotherapy (TUMT). This procedure uses microwaves to create heat. The heat destroys and removes a small amount of prostate tissue.  Transurethral needle ablation (TUNA). This is a procedure that uses radio frequencies to do the same as TUMT.  Interstitial laser coagulation (ILC). This is a procedure that uses a laser to do the same as TUMT and TUNA.  Transurethral electrovaporization (TUVP). This is a procedure that uses electrodes to do the same as the procedures listed above. Regardless of the method of treatment chosen, you and your caregiver will discuss the options. With this knowledge, you along with your caregiver can decide upon the best treatment for you. SEEK MEDICAL CARE IF:   You develop chills, fever of 100.5 F (38.1 C), or night sweats.  There is unexplained back pain.  Symptoms are not helped by medications prescribed.  You develop medication side effects.  Your urine becomes very dark or has a bad smell. SEEK IMMEDIATE MEDICAL CARE IF:   You are suddenly unable to urinate. This is an emergency. You should be seen immediately.  There are large amounts of blood or clots in the urine.  Your urinary problems become unmanageable.  You develop lightheadedness, severe dizziness, or you feel faint.  You develop moderate to severe low back or flank pain.  You develop chills or fever. Document Released: 05/18/2005 Document Revised: 08/10/2011 Document Reviewed: 02/07/2007 [facility] Patient Information 2013 [place], [place].

## 2012-05-10 NOTE — Progress Notes (Signed)
  Subjective:    Patient ID: Ronald Ball, male    DOB: 23-Aug-1913, 78 y.o.   MRN: [id number]  HPI CC: urinary sxs  Pleasant 78 yo with h/o HTN, DM prior pt of Dr. Lorenza Chick presents today with several week h/o urinary symptoms.  Voids prior to bedtime, but still waking up at night to void 3-5 times per night.  When awakens, lower back hurts, voiding resolves pain.  + urgency.  Back pain only comes on at night, always resolves after voiding.  No dysuria, hematuria.  No daytime frequency.  No rectal pain.  No fevers/chills, nausea/vomiting, abd pain.  H/o enlarged prostate, not on any meds for this. Denies h/o UTI and prostatitis.  Does not drink fluids right before bedtime. Weakness in stream at night, stronger during day.  Incomplete emptying only at night. Only 1 cup coffee/day in am.  No upcoming eye surgeries.  Wt Readings from Last 3 Encounters:  05/10/12 208 lb (94.348 kg)  03/16/12 206 lb 8 oz (93.668 kg)  10/08/11 202 lb 12 oz (91.967 kg)    Past Medical History  Diagnosis Date  . BPH (benign prostatic hyperplasia)   . Diverticulosis of colon   . History of barium enema 07/1991    Diverticuli  . History of CT scan of abdomen 08/14/05    with and without right adrenal adenoma-stable  . History of CT scan 08/14/05     pelvis with wnl  . Diabetes mellitus   . Hyperlipidemia     Review of Systems Per HPI    Objective:   Physical Exam  Nursing note and vitals reviewed. Constitutional: He appears well-developed and well-nourished. No distress.  Cardiovascular:       bigeminy  Abdominal: Soft. Bowel sounds are normal. He exhibits no distension and no mass. There is no hepatosplenomegaly. There is no tenderness. There is no rebound, no guarding and no CVA tenderness.  Musculoskeletal: He exhibits no edema.  Skin: Skin is warm and dry. No rash noted.       Assessment & Plan:

## 2012-05-10 NOTE — Assessment & Plan Note (Signed)
In h/o prostatitis, however sxs today and UA not consistent with infection. Anticipate all issues coming from BPH given other sxs endorsed. Will start flomax, advised to watch for low bp on this med but I anticipate his BP will tolerate. Already avoids night time fluid intake and limits caffeine. Advised to let us know if not improving as expected or any worsening for further eval. Pt Ronald Ball agree with plan.

## 2012-05-11 ENCOUNTER — Other Ambulatory Visit: Payer: Self-pay | Admitting: Family Medicine

## 2012-11-10 ENCOUNTER — Ambulatory Visit (INDEPENDENT_AMBULATORY_CARE_PROVIDER_SITE_OTHER): Payer: Medicare Other | Admitting: Family Medicine

## 2012-11-10 ENCOUNTER — Encounter: Payer: Self-pay | Admitting: Family Medicine

## 2012-11-10 VITALS — BP 132/78 | HR 74 | Temp 97.9°F | Wt 204.5 lb

## 2012-11-10 DIAGNOSIS — R42 Dizziness and giddiness: Secondary | ICD-10-CM

## 2012-11-10 NOTE — Assessment & Plan Note (Signed)
Anticipate BPV - will treat with meclizine prn. For neck strain - use tylenol and heating pad PRN. Pt agrees with plan.

## 2012-11-10 NOTE — Progress Notes (Signed)
  Subjective:    Patient ID: Ronald Ball, male    DOB: 1914/01/08, 79 y.o.   MRN: [id number]  HPI CC: neck pain, dizziness  Woke up this morning with significant dizziness when he got out of bed.  Also with neck pain/stiffness.  Resolved after he sat still.  Describes dizziness as room spinning around him.  No injury/fall recently. Had similar episode 1 month ago, worse at that time.  No vision changes, hearing changes, or palpitation, dyspnea, chest pain.  No presyncope.  No recent viral URI.  Has needed to use cane today (normally doesn't use). Feeling some head discomfort but not necessarily pain.  Not on cholesterol medication.  Past Medical History  Diagnosis Date  . BPH (benign prostatic hyperplasia)   . Diverticulosis of colon   . History of barium enema 07/1991    Diverticuli  . History of CT scan of abdomen 08/14/05    with and without right adrenal adenoma-stable  . History of CT scan 08/14/05     pelvis with wnl  . Diabetes mellitus   . Hyperlipidemia      Review of Systems Per HPI    Objective:   Physical Exam  Nursing note and vitals reviewed. Constitutional: He is oriented to person, place, and time. He appears well-developed and well-nourished. No distress.  Musculoskeletal: Normal range of motion.  Limited ROM 2/2 presumed arthritis. No midline spine tenderness  Neurological: He is alert and oriented to person, place, and time. He has normal strength. No cranial nerve deficit. He displays a negative Romberg sign.  Neg dix hallpike. CN 2-12 intact. Normal FTN Neg pronator drift  Skin: Skin is warm and dry. No rash noted.       Assessment & Plan:

## 2012-11-10 NOTE — Patient Instructions (Addendum)
For neck pain - I think this is a neck strain - treat with tylenol 500mg  once or twice daily and heating pad.  If not better let me know. For dizziness - sounds like benign vertigo - if persistent, may use meclizine 1/2 tablet as needed. Otherwise I think you're doing well.

## 2013-01-17 ENCOUNTER — Encounter: Payer: Self-pay | Admitting: Family Medicine

## 2013-01-17 ENCOUNTER — Ambulatory Visit (INDEPENDENT_AMBULATORY_CARE_PROVIDER_SITE_OTHER): Payer: Medicare Other | Admitting: Family Medicine

## 2013-01-17 VITALS — BP 126/70 | HR 76 | Temp 98.1°F | Wt 208.0 lb

## 2013-01-17 DIAGNOSIS — R351 Nocturia: Secondary | ICD-10-CM

## 2013-01-17 DIAGNOSIS — M549 Dorsalgia, unspecified: Secondary | ICD-10-CM

## 2013-01-17 DIAGNOSIS — N400 Enlarged prostate without lower urinary tract symptoms: Secondary | ICD-10-CM

## 2013-01-17 LAB — POCT URINALYSIS DIPSTICK
Glucose, UA: NEGATIVE
Leukocytes, UA: NEGATIVE
Protein, UA: NEGATIVE
Spec Grav, UA: 1.015
Urobilinogen, UA: 1

## 2013-01-17 MED ORDER — TAMSULOSIN HCL 0.4 MG PO CAPS: 0.4000 mg | ORAL_CAPSULE | Freq: Every day | ORAL | Status: DC

## 2013-01-17 NOTE — Assessment & Plan Note (Signed)
Anticipate due to BPH - treat with flomax.  See below.

## 2013-01-17 NOTE — Assessment & Plan Note (Signed)
No evidence of infection on UA or on prostate exam today. Discussed different treatment options  Treat with flomax 0.4mg  daily for next 2-4 weeks, then may continue if effective. Pt agrees with plan. No upcoming eye surgeries.

## 2013-01-17 NOTE — Patient Instructions (Signed)
Urine looking ok today.  No evidence of infection. I recommend starting flomax 0.4mg  daily for the next month, may continue if needed. Let us know if not improving with this medicine.  Benign Prostatic Hypertrophy  The prostate gland is part of the reproductive system of men. A normal prostate is about the size and shape of a walnut. The prostate gland makes a fluid that is mixed with sperm to make semen. This gland surrounds the urethra and is located in front of the rectum and just below the bladder. The bladder is where urine is stored. The urethra is the tube through which urine passes from the bladder to get out of the body. The prostate grows as a man ages. An enlarged prostate not caused by cancer is called benign prostatic hypertrophy (BPH). This is a common health problem in men over age 52. This condition is a normal part of aging. An enlarged prostate presses on the urethra. This makes it harder to pass urine. In the early stages of enlargement, the bladder can get by with a narrowed urethra by forcing the urine through. If the problem gets worse, medical or surgical treatment may be required.  This condition should be followed by your caregiver. Longstanding back pressure on the kidneys can cause infection. Back pressure and infection can progress to bladder damage and kidney (renal) failure. If needed, your caregiver may refer you to a specialist in kidney and prostate disease (urologist). CAUSES  The exact cause is not known.  SYMPTOMS   You are not able to completely empty your bladder.  Getting up often during the night to urinate.  Need to urinate frequently during the day.  Difficultly in starting urine flow.  Decrease in size and strength of the urine stream.  Dribbling after urination.  Pain on urination (more common with infection).  Inability to pass your water. This needs immediate treatment. DIAGNOSIS  These tests will help your caregiver understand your  problem:  Digital rectal exam (DRE). In a rectal exam, your caregiver checks your prostate by putting a gloved, lubricated finger into the rectum to feel the back of your prostate gland. This exam detects the size of the gland and abnormal lumps or growths.  Urinalysis (exam of the urine). This may include a culture if there is concern about infection.  Prostate Specific Antigen (PSA). This is a blood test used to screen for prostate cancer. It is not used alone for diagnosing prostate cancer.  Rectal ultrasound (sonogram). This test uses sound waves to electronically produce a "picture" of the prostate. It helps examine the prostate gland for cancer. TREATMENT  Mild symptoms may not need treatment. Simple observation and yearly exams may be all that is required. Medications and surgery are options for more severe problems. Your caregiver can help you make an informed decision for what is best. Two classes of medications are available for relief of prostate symptoms:  Medications that shrink the prostate. This helps relieve symptoms.  Uncommon side effects include problems with sexual function.  Medications to relax the muscle of the prostate. This also relieves the obstruction.  Side effects can include dizziness, fatigue, lightheadedness, and retrograde ejaculation (diminished volume of ejaculate). Several types of surgical treatments are available for relief of prostate symptoms:  Transurethral resection of the prostate (TURP). In this treatment, an instrument is inserted through opening at the tip of the penis. It is used to cut away pieces of the inner core of the prostate. The pieces are removed  through the same opening of the penis. This removes the obstruction and helps get rid of the symptoms.  Transurethral incision (TUIP). In this procedure, small cuts are made in the prostate. This lessens the prostates pressure on the urethra.  Transurethral microwave thermotherapy (TUMT). This  procedure uses microwaves to create heat. The heat destroys and removes a small amount of prostate tissue.  Transurethral needle ablation (TUNA). This is a procedure that uses radio frequencies to do the same as TUMT.  Interstitial laser coagulation (ILC). This is a procedure that uses a laser to do the same as TUMT and TUNA.  Transurethral electrovaporization (TUVP). This is a procedure that uses electrodes to do the same as the procedures listed above. Regardless of the method of treatment chosen, you and your caregiver will discuss the options. With this knowledge, you along with your caregiver can decide upon the best treatment for you. SEEK MEDICAL CARE IF:   You develop chills, fever of 100.5 F (38.1 C), or night sweats.  There is unexplained back pain.  Symptoms are not helped by medications prescribed.  You develop medication side effects.  Your urine becomes very dark or has a bad smell. SEEK IMMEDIATE MEDICAL CARE IF:   You are suddenly unable to urinate. This is an emergency. You should be seen immediately.  There are large amounts of blood or clots in the urine.  Your urinary problems become unmanageable.  You develop lightheadedness, severe dizziness, or you feel faint.  You develop moderate to severe low back or flank pain.  You develop chills or fever. Document Released: 05/18/2005 Document Revised: 08/10/2011 Document Reviewed: 02/07/2007 [facility] Patient Information 2014 [place], [place].

## 2013-01-17 NOTE — Progress Notes (Signed)
  Subjective:    Patient ID: Ronald Ball, male    DOB: May 20, 1914, 79 y.o.   MRN: [id number]  HPI CC: nocturia  Nocturia every 2 hours.  Trouble getting restful sleep.  Increased urgency and frequency over last 2 weeks.  Some incomplete emptying. Denies daytime sxs.  No dysuria, hematuria, abd pain, fevers/chills, nausea.  Normal stooling. No leg pain.  No upcoming eye surgeries.  No recent vision changes. 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  Sees eye dcotor every several months.  H/o enlarged prostate, not on any meds for this.  Denies h/o UTI and prostatitis.   Lab Results  Component Value Date   PSA 0.36 07/02/2010   PSA 0.41 06/27/2009   PSA 0.53 07/25/2007    Past Medical History  Diagnosis Date  . BPH (benign prostatic hyperplasia)   . Diverticulosis of colon   . History of barium enema 07/1991    Diverticuli  . History of CT scan of abdomen 08/14/05    with and without right adrenal adenoma-stable  . History of CT scan 08/14/05     pelvis with wnl  . Diabetes mellitus   . Hyperlipidemia    Past Surgical History  Procedure Laterality Date  . Prostate biopsy  07/13/98    benign, cytoscopy wnl  . Shoulder surgery  04/03    right, [place]  . Cataract extraction  04/01    left eye  . Cataract extraction  01/04     right eye   Review of Systems Per HPI    Objective:   Physical Exam  Nursing note and vitals reviewed. Constitutional: He appears well-developed and well-nourished. No distress.  Abdominal: Soft. Normal appearance and bowel sounds are normal. He exhibits no distension and no mass. There is no hepatosplenomegaly. There is no tenderness. There is no rebound, no guarding and no CVA tenderness.  Genitourinary: Rectum normal. Rectal exam shows no external hemorrhoid (noninflamed anterior), no internal hemorrhoid, no fissure, no mass, no tenderness and anal tone normal. Prostate is enlarged (20gm). Prostate is not tender.  Some stool in vault   Musculoskeletal: He exhibits edema (1+ bilaterally pitting).  No midline or paraspinous mm tenderness       Assessment & Plan:

## 2013-01-31 ENCOUNTER — Emergency Department (HOSPITAL_COMMUNITY): Payer: Medicare Other

## 2013-01-31 ENCOUNTER — Encounter (HOSPITAL_COMMUNITY): Payer: Self-pay | Admitting: *Deleted

## 2013-01-31 ENCOUNTER — Observation Stay (HOSPITAL_COMMUNITY)
Admission: EM | Admit: 2013-01-31 | Discharge: 2013-02-01 | Disposition: A | Discharge status: Home / Self Care | Payer: Medicare Other | Attending: Internal Medicine | Admitting: Internal Medicine

## 2013-01-31 DIAGNOSIS — M549 Dorsalgia, unspecified: Secondary | ICD-10-CM | POA: Insufficient documentation

## 2013-01-31 DIAGNOSIS — K5730 Diverticulosis of large intestine without perforation or abscess without bleeding: Secondary | ICD-10-CM | POA: Insufficient documentation

## 2013-01-31 DIAGNOSIS — H538 Other visual disturbances: Secondary | ICD-10-CM | POA: Insufficient documentation

## 2013-01-31 DIAGNOSIS — R42 Dizziness and giddiness: Principal | ICD-10-CM | POA: Diagnosis present

## 2013-01-31 DIAGNOSIS — H409 Unspecified glaucoma: Secondary | ICD-10-CM | POA: Insufficient documentation

## 2013-01-31 DIAGNOSIS — H814 Vertigo of central origin: Secondary | ICD-10-CM

## 2013-01-31 DIAGNOSIS — Z7982 Long term (current) use of aspirin: Secondary | ICD-10-CM | POA: Insufficient documentation

## 2013-01-31 DIAGNOSIS — Z79899 Other long term (current) drug therapy: Secondary | ICD-10-CM | POA: Insufficient documentation

## 2013-01-31 DIAGNOSIS — I672 Cerebral atherosclerosis: Secondary | ICD-10-CM | POA: Insufficient documentation

## 2013-01-31 DIAGNOSIS — E785 Hyperlipidemia, unspecified: Secondary | ICD-10-CM | POA: Insufficient documentation

## 2013-01-31 DIAGNOSIS — I10 Essential (primary) hypertension: Secondary | ICD-10-CM | POA: Diagnosis present

## 2013-01-31 DIAGNOSIS — G8929 Other chronic pain: Secondary | ICD-10-CM | POA: Insufficient documentation

## 2013-01-31 DIAGNOSIS — E119 Type 2 diabetes mellitus without complications: Secondary | ICD-10-CM | POA: Insufficient documentation

## 2013-01-31 DIAGNOSIS — N400 Enlarged prostate without lower urinary tract symptoms: Secondary | ICD-10-CM | POA: Diagnosis present

## 2013-01-31 HISTORY — DX: Essential (primary) hypertension: I10

## 2013-01-31 HISTORY — DX: Dorsalgia, unspecified: M54.9

## 2013-01-31 HISTORY — DX: Other chronic pain: G89.29

## 2013-01-31 HISTORY — DX: Unspecified glaucoma: H40.9

## 2013-01-31 LAB — CBC WITH DIFFERENTIAL/PLATELET
Basophils Absolute: 0 10*3/uL (ref 0.0–0.1)
Eosinophils Relative: 0 % (ref 0–5)
HCT: 44.4 % (ref 39.0–52.0)
Lymphocytes Relative: 11 % — ABNORMAL LOW (ref 12–46)
MCHC: 33.6 g/dL (ref 30.0–36.0)
MCV: 89.9 fL (ref 78.0–100.0)
Monocytes Absolute: 1 10*3/uL (ref 0.1–1.0)
RDW: 12.9 % (ref 11.5–15.5)
WBC: 12.3 10*3/uL — ABNORMAL HIGH (ref 4.0–10.5)

## 2013-01-31 LAB — URINALYSIS, ROUTINE W REFLEX MICROSCOPIC
Ketones, ur: NEGATIVE mg/dL
Leukocytes, UA: NEGATIVE
Nitrite: NEGATIVE
Protein, ur: NEGATIVE mg/dL
Urobilinogen, UA: 1 mg/dL (ref 0.0–1.0)

## 2013-01-31 LAB — COMPREHENSIVE METABOLIC PANEL
AST: 20 U/L (ref 0–37)
BUN: 23 mg/dL (ref 6–23)
CO2: 22 mEq/L (ref 19–32)
Calcium: 9.2 mg/dL (ref 8.4–10.5)
Creatinine, Ser: 1.06 mg/dL (ref 0.50–1.35)
GFR calc Af Amer: 66 mL/min — ABNORMAL LOW (ref >90)
GFR calc non Af Amer: 57 mL/min — ABNORMAL LOW (ref >90)
Glucose, Bld: 217 mg/dL — ABNORMAL HIGH (ref 70–99)

## 2013-01-31 LAB — PRO B NATRIURETIC PEPTIDE: Pro B Natriuretic peptide (BNP): 159.8 pg/mL (ref 0–450)

## 2013-01-31 LAB — TROPONIN I: Troponin I: <0.3 ng/mL (ref <0.30)

## 2013-01-31 MED ORDER — ONDANSETRON HCL 4 MG/2ML IJ SOLN
4.0000 mg | Freq: Once | INTRAMUSCULAR | Status: AC
  Administered 2013-01-31: 4 mg via INTRAVENOUS
  Filled 2013-01-31: qty 2

## 2013-01-31 MED ORDER — TAMSULOSIN HCL 0.4 MG PO CAPS
0.4000 mg | ORAL_CAPSULE | Freq: Every day | ORAL | Status: DC
  Administered 2013-01-31 – 2013-02-01 (×2): 0.4 mg via ORAL
  Filled 2013-01-31 (×2): qty 1

## 2013-01-31 MED ORDER — LISINOPRIL-HYDROCHLOROTHIAZIDE 10-12.5 MG PO TABS: 1.0000 | ORAL_TABLET | Freq: Every day | ORAL | Status: DC

## 2013-01-31 MED ORDER — ACETAMINOPHEN 325 MG PO TABS: 650.0000 mg | ORAL_TABLET | Freq: Four times a day (QID) | ORAL | Status: DC | PRN

## 2013-01-31 MED ORDER — HYDROCHLOROTHIAZIDE 12.5 MG PO CAPS
12.5000 mg | ORAL_CAPSULE | Freq: Every day | ORAL | Status: DC
  Administered 2013-01-31 – 2013-02-01 (×2): 12.5 mg via ORAL
  Filled 2013-01-31 (×3): qty 1

## 2013-01-31 MED ORDER — ACETAMINOPHEN 650 MG RE SUPP: 650.0000 mg | Freq: Four times a day (QID) | RECTAL | Status: DC | PRN

## 2013-01-31 MED ORDER — MECLIZINE HCL 25 MG PO TABS
25.0000 mg | ORAL_TABLET | Freq: Three times a day (TID) | ORAL | Status: DC | PRN
  Filled 2013-01-31: qty 1

## 2013-01-31 MED ORDER — ALUM & MAG HYDROXIDE-SIMETH 200-200-20 MG/5ML PO SUSP: 30.0000 mL | Freq: Four times a day (QID) | ORAL | Status: DC | PRN

## 2013-01-31 MED ORDER — ENOXAPARIN SODIUM 40 MG/0.4ML ~~LOC~~ SOLN
40.0000 mg | SUBCUTANEOUS | Status: DC
  Administered 2013-01-31: 40 mg via SUBCUTANEOUS
  Filled 2013-01-31 (×2): qty 0.4

## 2013-01-31 MED ORDER — ONDANSETRON HCL 4 MG/2ML IJ SOLN: 4.0000 mg | Freq: Four times a day (QID) | INTRAMUSCULAR | Status: DC | PRN

## 2013-01-31 MED ORDER — TIMOLOL MALEATE 0.5 % OP SOLN
1.0000 [drp] | Freq: Two times a day (BID) | OPHTHALMIC | Status: DC
  Administered 2013-01-31: 1 [drp] via OPHTHALMIC
  Filled 2013-01-31: qty 5

## 2013-01-31 MED ORDER — ASPIRIN 325 MG PO TABS: 325.0000 mg | ORAL_TABLET | Freq: Every day | ORAL | Status: DC

## 2013-01-31 MED ORDER — SODIUM CHLORIDE 0.9 % IJ SOLN: 3.0000 mL | Freq: Two times a day (BID) | INTRAMUSCULAR | Status: DC

## 2013-01-31 MED ORDER — ONDANSETRON HCL 4 MG PO TABS: 4.0000 mg | ORAL_TABLET | Freq: Four times a day (QID) | ORAL | Status: DC | PRN

## 2013-01-31 MED ORDER — ASPIRIN EC 81 MG PO TBEC
81.0000 mg | DELAYED_RELEASE_TABLET | Freq: Every day | ORAL | Status: DC
  Administered 2013-01-31 – 2013-02-01 (×2): 81 mg via ORAL
  Filled 2013-01-31 (×3): qty 1

## 2013-01-31 MED ORDER — LISINOPRIL 10 MG PO TABS
10.0000 mg | ORAL_TABLET | Freq: Every day | ORAL | Status: DC
  Administered 2013-01-31 – 2013-02-01 (×2): 10 mg via ORAL
  Filled 2013-01-31 (×3): qty 1

## 2013-01-31 MED ORDER — SODIUM CHLORIDE 0.9 % IV BOLUS (SEPSIS)
250.0000 mL | Freq: Once | INTRAVENOUS | Status: AC
  Administered 2013-01-31: 250 mL via INTRAVENOUS

## 2013-01-31 MED ORDER — LATANOPROST 0.005 % OP SOLN
1.0000 [drp] | Freq: Every day | OPHTHALMIC | Status: DC
  Administered 2013-01-31: 1 [drp] via OPHTHALMIC
  Filled 2013-01-31: qty 2.5

## 2013-01-31 MED ORDER — SODIUM CHLORIDE 0.9 % IV SOLN: 250.0000 mL | INTRAVENOUS | Status: DC | PRN

## 2013-01-31 MED ORDER — SODIUM CHLORIDE 0.9 % IV SOLN
INTRAVENOUS | Status: AC
  Administered 2013-01-31: 19:00:00 via INTRAVENOUS

## 2013-01-31 MED ORDER — SODIUM CHLORIDE 0.9 % IJ SOLN: 3.0000 mL | INTRAMUSCULAR | Status: DC | PRN

## 2013-01-31 NOTE — Discharge Summary (Deleted)
PATIENT DETAILS  Name: Ronald Ball  Age: 79 y.o.  Sex: male  Date of Birth: 06/07/1913  Admit Date: 01/31/2013  PCP:Javier Gutierrez, MD   CHIEF COMPLAINT:  Vertigo since this morning   HPI:  Ronald Ball is a 79 y.o. male with a Past Medical History of hypertension, BPH who presents today with the above noted complaint. Per patient, he woke this morning and as soon as he attempted to sit up in bed, he started having vertigo-which he describes as the room spinning around him. He could not even walk to the bathroom. She never had vomiting, but has some episodes of nausea. He was then brought to the hospital for further evaluation and treatment, a CT scan of the head was negative. A MRI of the brain did not show any CVA, however a MRA brain did show some narrowing of the vessels in his posterior circulation. Since the patient was unable to ambulate safely, he he was referred to the hospitalist service for admission and evaluation.  There is no history of chest pain, shortness of breath, double vision, dysarthria or difficulty speaking.   ALLERGIES:  Allergies   Allergen  Reactions   .  Atenolol      REACTION: ? Side effect   .  Esomeprazole Magnesium      REACTION: abd swelling   .  Ofloxacin      REACTION: itching     PAST MEDICAL HISTORY:  Past Medical History   Diagnosis  Date   .  BPH (benign prostatic hyperplasia)    .  Diverticulosis of colon    .  History of barium enema  07/1991     Diverticuli   .  History of CT scan of abdomen  08/14/05     with and without right adrenal adenoma-stable   .  History of CT scan  08/14/05     pelvis with wnl   .  Diabetes mellitus    .  Hyperlipidemia    .  Glaucoma    .  Hypertension    .  Chronic back pain      PAST SURGICAL HISTORY:  Past Surgical History   Procedure  Laterality  Date   .  Prostate biopsy   07/13/98     benign, cytoscopy wnl   .  Shoulder surgery   04/03     right, [place]   .  Cataract  extraction   04/01     left eye   .  Cataract extraction   01/04     right eye     MEDICATIONS AT HOME:  Prior to Admission medications   Medication  Sig  Start Date  End Date  Taking?  Authorizing Provider   aspirin EC 81 MG tablet  Take 81 mg by mouth daily.    Yes  Historical Provider, MD   latanoprost (XALATAN) 0.005 % ophthalmic solution  Place 1 drop into the right eye at bedtime.    Yes  Historical Provider, MD   lisinopril-hydrochlorothiazide (PRINZIDE,ZESTORETIC) 10-12.5 MG per tablet  Take 1 tablet by mouth daily.    Yes  Historical Provider, MD   menthol-zinc oxide (GOLD BOND) powder  Apply topically 2 (two) times daily as needed.    Yes  Historical Provider, MD   timolol (TIMOPTIC) 0.5 % ophthalmic solution  Place 1 drop into both eyes 2 (two) times daily.    Yes  Historical Provider, MD   Naproxen Sodium (ALEVE) 220 MG   CAPS  Take by mouth 2 (two) times daily as needed.     Historical Provider, MD   tamsulosin (FLOMAX) 0.4 MG CAPS capsule  Take 1 capsule (0.4 mg total) by mouth daily.  01/17/13    Javier Gutierrez, MD   FAMILY HISTORY:  Family History   Problem  Relation  Age of Onset   .  Cancer  Mother      stomach and colon   .  Cancer  Brother      possible lung cancer     SOCIAL HISTORY:  reports that he has never smoked. He has never used smokeless tobacco. He reports that he does not drink alcohol or use illicit drugs.   REVIEW OF SYSTEMS:  Constitutional:  No weight loss, night sweats, Fevers, chills, fatigue.   HEENT:  No headaches, Difficulty swallowing,Tooth/dental problems,Sore throat,  No sneezing, itching, ear ache, nasal congestion, post nasal drip,   Cardio-vascular:  No chest pain, Orthopnea, PND, swelling in lower extremities, anasarca, dizziness, palpitations   GI:  No heartburn, indigestion, abdominal pain, nausea, vomiting, diarrhea, change in bowel habits, loss of appetite   Resp:  No shortness of breath with exertion or at rest. No excess  mucus, no productive cough, No non-productive cough, No coughing up of blood.No change in color of mucus.No wheezing.No chest wall deformity   Skin:  no rash or lesions.   GU:  no dysuria, change in color of urine, no urgency or frequency. No flank pain.   Musculoskeletal:  No joint pain or swelling. No decreased range of motion. No back pain.   Psych:  No change in mood or affect. No depression or anxiety. No memory loss.   PHYSICAL EXAM:  Blood pressure 115/54, pulse 82, temperature 97.5 F (36.4 C), temperature source Oral, resp. rate 24, SpO2 97.00%.  General appearance :Awake, alert, not in any distress. Speech Clear. Not toxic Looking  HEENT: Atraumatic and Normocephalic, pupils equally reactive to light and accomodation. No nystagmus.  Neck: supple, no JVD. No cervical lymphadenopathy.  Chest:Good air entry bilaterally, no added sounds  CVS: S1 S2 regular, no murmurs.  Abdomen: Bowel sounds present, Non tender and not distended with no gaurding, rigidity or rebound.  Extremities: B/L Lower Ext shows no edema, both legs are warm to touch  Neurology: Awake alert, and oriented X 3, CN II-XII intact, Non focal  Skin:No Rash  Wounds:N/A   LABS ON ADMISSION:   Recent Labs   01/31/13 0820   NA  138   K  4.0   CL  98   CO2  22   GLUCOSE  217*   BUN  23   CREATININE  1.06   CALCIUM  9.2     Recent Labs   01/31/13 0820   AST  20   ALT  14   ALKPHOS  82   BILITOT  0.5   PROT  6.7   ALBUMIN  3.6     Recent Labs   01/31/13 0820   LIPASE  17     Recent Labs   01/31/13 0820   WBC  12.3*   NEUTROABS  9.9*   HGB  14.9   HCT  44.4   MCV  89.9   PLT  203     Recent Labs   01/31/13 0820   TROPONINI  <0.30    No results found for this basename: DDIMER, in the last 72 hours  No components found with this basename: POCBNP,     RADIOLOGIC STUDIES ON ADMISSION:  Ct Head Wo Contrast  01/31/2013 *RADIOLOGY REPORT* Clinical Data: Dizziness with nausea and  vomiting; blurred vision CT HEAD WITHOUT CONTRAST Technique: Contiguous axial images were obtained from the base of the skull through the vertex without contrast. Study was obtained within 24 hours of patient arrival at the emergency department. Comparison: None. Findings: There is diffuse atrophy, moderately severe. There is no mass, hemorrhage, extra-axial fluid collection, or midline shift. There is small vessel disease throughout the centra semiovale bilaterally. No acute appearing infarct is seen. Elsewhere, gray-white compartments are normal. The bony calvarium appears intact. The mastoid air cells are clear. Cataracts are noted bilaterally. IMPRESSION: Diffuse atrophy with supratentorial small vessel disease. No apparent mass, hemorrhage, or acute appearing infarct. Original Report Authenticated By: Lynne Woodruff, M.D.  Mr Angiogram Head Wo Contrast  01/31/2013 *RADIOLOGY REPORT* Clinical Data: Vertigo. Generalized weakness. Nausea. Vomiting. Diabetic hypertensive hyperlipidemic patient. MRI BRAIN WITHOUT CONTRAST MRA HEAD WITHOUT CONTRAST Technique: Multiplanar, multiecho pulse sequences of the brain and surrounding structures were obtained according to standard protocol without intravenous contrast. Angiographic images of the head were obtained using MRA technique without contrast. Comparison: 01/31/2013 CT. No comparison MR. MRI HEAD Findings: No acute infarct. No intracranial hemorrhage. Moderate small vessel disease type changes. Global atrophy. Ventricular prominence may be explained by atrophy although mild hydrocephalus not excluded. Aqueduct is patent. No intracranial mass lesion detected on this unenhanced exam. Cervical medullary junction, pituitary region, pineal region and orbital structures unremarkable. Minimal paranasal sinus mucosal thickening. IMPRESSION: No acute infarct. No intracranial hemorrhage. Moderate small vessel disease type changes. Global atrophy. Ventricular prominence may be  explained by atrophy although mild hydrocephalus not excluded. No intracranial mass lesion detected on this unenhanced exam. MRA HEAD Findings: Ectatic internal carotid arteries. Mild narrowing supraclinoid aspect of the left internal carotid artery. Mild narrowing A1 segment of the anterior cerebral artery bilaterally. Mild narrowing distal M1 segment right middle cerebral artery. Right vertebral artery ends in a PICA distribution. Mild narrowing and irregularity of the right vertebral artery and right PICA. Mild irregularity and narrowing of portions of the left vertebral artery. Mild to moderate narrowing proximal basilar artery with moderate to marked narrowing distal basilar artery. Nonvisualization AICAs. Moderate to marked long segment stenosis of the posterior cerebral artery proximal to mid aspect bilaterally. Branch vessel irregularity anterior and posterior circulation. No aneurysm identified. IMPRESSION: Intracranial atherosclerotic type changes more notable involving the posterior circulation as detailed above. Original Report Authenticated By: Steven Olson, M.D.  Mr Brain Wo Contrast  01/31/2013 *RADIOLOGY REPORT* Clinical Data: Vertigo. Generalized weakness. Nausea. Vomiting. Diabetic hypertensive hyperlipidemic patient. MRI BRAIN WITHOUT CONTRAST MRA HEAD WITHOUT CONTRAST Technique: Multiplanar, multiecho pulse sequences of the brain and surrounding structures were obtained according to standard protocol without intravenous contrast. Angiographic images of the head were obtained using MRA technique without contrast. Comparison: 01/31/2013 CT. No comparison MR. MRI HEAD Findings: No acute infarct. No intracranial hemorrhage. Moderate small vessel disease type changes. Global atrophy. Ventricular prominence may be explained by atrophy although mild hydrocephalus not excluded. Aqueduct is patent. No intracranial mass lesion detected on this unenhanced exam. Cervical medullary junction, pituitary region,  pineal region and orbital structures unremarkable. Minimal paranasal sinus mucosal thickening. IMPRESSION: No acute infarct. No intracranial hemorrhage. Moderate small vessel disease type changes. Global atrophy. Ventricular prominence may be explained by atrophy although mild hydrocephalus not excluded. No intracranial mass lesion detected on this unenhanced exam. MRA HEAD Findings: Ectatic internal carotid arteries. Mild narrowing supraclinoid aspect of   the left internal carotid artery. Mild narrowing A1 segment of the anterior cerebral artery bilaterally. Mild narrowing distal M1 segment right middle cerebral artery. Right vertebral artery ends in a PICA distribution. Mild narrowing and irregularity of the right vertebral artery and right PICA. Mild irregularity and narrowing of portions of the left vertebral artery. Mild to moderate narrowing proximal basilar artery with moderate to marked narrowing distal basilar artery. Nonvisualization AICAs. Moderate to marked long segment stenosis of the posterior cerebral artery proximal to mid aspect bilaterally. Branch vessel irregularity anterior and posterior circulation. No aneurysm identified. IMPRESSION: Intracranial atherosclerotic type changes more notable involving the posterior circulation as detailed above. Original Report Authenticated By: Steven Olson, M.D.   EKG: Independently reviewed.NSR with artifacts   ASSESSMENT AND PLAN:  Present on Admission:  . Vertigo  - Suspect benign paroxysmal positional Vertigo- as the patient gives a history of vertigo upon leaning forward.  - Admit to telemetry unit for the first 24 hours, will get vestibular physical therapy to assess him tomorrow for maneuvers to see if this will help  - As needed meclizine and as needed antiemetics  - Please note-per ED RN- not orthostatic   . BENIGN PROSTATIC HYPERTROPHY  - Continue Flomax   . HYPERTENSION  - Continue lisinopril and hydrochlorothiazide  Further plan will  depend as patient's clinical course evolves and further radiologic and laboratory data become available. Patient will be monitored closely.   DVT Prophylaxis:  Prophylactic Lovenox   Code Status:  Full Code- but does not want to be kept alive on a ventilator for a long time   Total time spent for admission equals 45 minutes.   Bradie Lacock  [facility]  Pager [phone]  If 7PM-7AM, please contact night-coverage  [email or web address]  Password [email or web address]  01/31/2013, 4:22 PM  

## 2013-01-31 NOTE — ED Notes (Signed)
Pt stated he could not walk, but did take 2 steps in room then became unsteady with assist from EMT and son.

## 2013-01-31 NOTE — ED Notes (Signed)
Report called to RN on floor, [place], [employer]ity fund[employer]. Nurse has no further questions upon report given. Family at bedside. Pt being prepared for transport to floor.

## 2013-01-31 NOTE — ED Notes (Signed)
Pt states no nausea at this time. Pt states still feels dizzy. Pt alert and mentating appropriately. NAD noted at this time.

## 2013-01-31 NOTE — ED Notes (Signed)
Report attempted 

## 2013-01-31 NOTE — ED Notes (Signed)
Pt states he was unable to walk. Pt states he thinks if he could eat he could maybe walk. Pt alert and mentating appropriately. NAD noted at this time. Family at bedside notified of delay and states will ask Dr. Blinda Leatherwood about getting pt something to eat.

## 2013-01-31 NOTE — H&P (Signed)
PATIENT DETAILS  Name: Ronald Ball  Age: 79 y.o.  Sex: male  Date of Birth: 12-27-1913  Admit Date: 01/31/2013  WUJ:[id number] Sharen Hones, MD   CHIEF COMPLAINT:  Vertigo since this morning   HPI:  Ronald Ball is a 79 y.o. male with a Past Medical History of hypertension, BPH who presents today with the above noted complaint. Per patient, he woke this morning and as soon as he attempted to sit up in bed, he started having vertigo-which he describes as the room spinning around him. He could not even walk to the bathroom. She never had vomiting, but has some episodes of nausea. He was then brought to the hospital for further evaluation and treatment, a CT scan of the head was negative. A MRI of the brain did not show any CVA, however a MRA brain did show some narrowing of the vessels in his posterior circulation. Since the patient was unable to ambulate safely, he he was referred to the hospitalist service for admission and evaluation.  There is no history of chest pain, shortness of breath, double vision, dysarthria or difficulty speaking.   ALLERGIES:  Allergies   Allergen  Reactions   .  Atenolol      REACTION: ? Side effect   .  Esomeprazole Magnesium      REACTION: abd swelling   .  Ofloxacin      REACTION: itching     PAST MEDICAL HISTORY:  Past Medical History   Diagnosis  Date   .  BPH (benign prostatic hyperplasia)    .  Diverticulosis of colon    .  History of barium enema  07/1991     Diverticuli   .  History of CT scan of abdomen  08/14/05     with and without right adrenal adenoma-stable   .  History of CT scan  08/14/05     pelvis with wnl   .  Diabetes mellitus    .  Hyperlipidemia    .  Glaucoma    .  Hypertension    .  Chronic back pain      PAST SURGICAL HISTORY:  Past Surgical History   Procedure  Laterality  Date   .  Prostate biopsy   07/13/98     benign, cytoscopy wnl   .  Shoulder surgery   04/03     right, [place]   .  Cataract  extraction   04/01     left eye   .  Cataract extraction   01/04     right eye     MEDICATIONS AT HOME:  Prior to Admission medications   Medication  Sig  Start Date  End Date  Taking?  Authorizing Provider   aspirin EC 81 MG tablet  Take 81 mg by mouth daily.    Yes  Historical Provider, MD   latanoprost (XALATAN) 0.005 % ophthalmic solution  Place 1 drop into the right eye at bedtime.    Yes  Historical Provider, MD   lisinopril-hydrochlorothiazide (PRINZIDE,ZESTORETIC) 10-12.5 MG per tablet  Take 1 tablet by mouth daily.    Yes  Historical Provider, MD   menthol-zinc oxide (GOLD BOND) powder  Apply topically 2 (two) times daily as needed.    Yes  Historical Provider, MD   timolol (TIMOPTIC) 0.5 % ophthalmic solution  Place 1 drop into both eyes 2 (two) times daily.    Yes  Historical Provider, MD   Naproxen Sodium (ALEVE) 220 MG  CAPS  Take by mouth 2 (two) times daily as needed.     Historical Provider, MD   tamsulosin (FLOMAX) 0.4 MG CAPS capsule  Take 1 capsule (0.4 mg total) by mouth daily.  01/17/13    Eustaquio Boyden, MD   FAMILY HISTORY:  Family History   Problem  Relation  Age of Onset   .  Cancer  Mother      stomach and colon   .  Cancer  Brother      possible lung cancer     SOCIAL HISTORY:  reports that he has never smoked. He has never used smokeless tobacco. He reports that he does not drink alcohol or use illicit drugs.   REVIEW OF SYSTEMS:  Constitutional:  No weight loss, night sweats, Fevers, chills, fatigue.   HEENT:  No headaches, Difficulty swallowing,Tooth/dental problems,Sore throat,  No sneezing, itching, ear ache, nasal congestion, post nasal drip,   Cardio-vascular:  No chest pain, Orthopnea, PND, swelling in lower extremities, anasarca, dizziness, palpitations   GI:  No heartburn, indigestion, abdominal pain, nausea, vomiting, diarrhea, change in bowel habits, loss of appetite   Resp:  No shortness of breath with exertion or at rest. No excess  mucus, no productive cough, No non-productive cough, No coughing up of blood.No change in color of mucus.No wheezing.No chest wall deformity   Skin:  no rash or lesions.   GU:  no dysuria, change in color of urine, no urgency or frequency. No flank pain.   Musculoskeletal:  No joint pain or swelling. No decreased range of motion. No back pain.   Psych:  No change in mood or affect. No depression or anxiety. No memory loss.   PHYSICAL EXAM:  Blood pressure 115/54, pulse 82, temperature 97.5 F (36.4 C), temperature source Oral, resp. rate 24, SpO2 97.00%.  General appearance :Awake, alert, not in any distress. Speech Clear. Not toxic Looking  HEENT: Atraumatic and Normocephalic, pupils equally reactive to light and accomodation. No nystagmus.  Neck: supple, no JVD. No cervical lymphadenopathy.  Chest:Good air entry bilaterally, no added sounds  CVS: S1 S2 regular, no murmurs.  Abdomen: Bowel sounds present, Non tender and not distended with no gaurding, rigidity or rebound.  Extremities: B/L Lower Ext shows no edema, both legs are warm to touch  Neurology: Awake alert, and oriented X 3, CN II-XII intact, Non focal  Skin:No Rash  Wounds:N/A   LABS ON ADMISSION:   Recent Labs   01/31/13 0820   NA  138   K  4.0   CL  98   CO2  22   GLUCOSE  217*   BUN  23   CREATININE  1.06   CALCIUM  9.2     Recent Labs   01/31/13 0820   AST  20   ALT  14   ALKPHOS  82   BILITOT  0.5   PROT  6.7   ALBUMIN  3.6     Recent Labs   01/31/13 0820   LIPASE  17     Recent Labs   01/31/13 0820   WBC  12.3*   NEUTROABS  9.9*   HGB  14.9   HCT  44.4   MCV  89.9   PLT  203     Recent Labs   01/31/13 0820   TROPONINI  <0.30    No results found for this basename: DDIMER, in the last 72 hours  No components found with this basename: POCBNP,  RADIOLOGIC STUDIES ON ADMISSION:  Ct Head Wo Contrast  01/31/2013 *RADIOLOGY REPORT* Clinical Data: Dizziness with nausea and  vomiting; blurred vision CT HEAD WITHOUT CONTRAST Technique: Contiguous axial images were obtained from the base of the skull through the vertex without contrast. Study was obtained within 24 hours of patient arrival at the emergency department. Comparison: None. Findings: There is diffuse atrophy, moderately severe. There is no mass, hemorrhage, extra-axial fluid collection, or midline shift. There is small vessel disease throughout the centra semiovale bilaterally. No acute appearing infarct is seen. Elsewhere, gray-white compartments are normal. The bony calvarium appears intact. The mastoid air cells are clear. Cataracts are noted bilaterally. IMPRESSION: Diffuse atrophy with supratentorial small vessel disease. No apparent mass, hemorrhage, or acute appearing infarct. Original Report Authenticated By: Bretta Bang, M.D.  Mr Angiogram Head Wo Contrast  01/31/2013 *RADIOLOGY REPORT* Clinical Data: Vertigo. Generalized weakness. Nausea. Vomiting. Diabetic hypertensive hyperlipidemic patient. MRI BRAIN WITHOUT CONTRAST MRA HEAD WITHOUT CONTRAST Technique: Multiplanar, multiecho pulse sequences of the brain and surrounding structures were obtained according to standard protocol without intravenous contrast. Angiographic images of the head were obtained using MRA technique without contrast. Comparison: 01/31/2013 CT. No comparison MR. MRI HEAD Findings: No acute infarct. No intracranial hemorrhage. Moderate small vessel disease type changes. Global atrophy. Ventricular prominence may be explained by atrophy although mild hydrocephalus not excluded. Aqueduct is patent. No intracranial mass lesion detected on this unenhanced exam. Cervical medullary junction, pituitary region, pineal region and orbital structures unremarkable. Minimal paranasal sinus mucosal thickening. IMPRESSION: No acute infarct. No intracranial hemorrhage. Moderate small vessel disease type changes. Global atrophy. Ventricular prominence may be  explained by atrophy although mild hydrocephalus not excluded. No intracranial mass lesion detected on this unenhanced exam. MRA HEAD Findings: Ectatic internal carotid arteries. Mild narrowing supraclinoid aspect of the left internal carotid artery. Mild narrowing A1 segment of the anterior cerebral artery bilaterally. Mild narrowing distal M1 segment right middle cerebral artery. Right vertebral artery ends in a PICA distribution. Mild narrowing and irregularity of the right vertebral artery and right PICA. Mild irregularity and narrowing of portions of the left vertebral artery. Mild to moderate narrowing proximal basilar artery with moderate to marked narrowing distal basilar artery. Nonvisualization AICAs. Moderate to marked long segment stenosis of the posterior cerebral artery proximal to mid aspect bilaterally. Branch vessel irregularity anterior and posterior circulation. No aneurysm identified. IMPRESSION: Intracranial atherosclerotic type changes more notable involving the posterior circulation as detailed above. Original Report Authenticated By: Lacy Duverney, M.D.  Mr Brain Wo Contrast  01/31/2013 *RADIOLOGY REPORT* Clinical Data: Vertigo. Generalized weakness. Nausea. Vomiting. Diabetic hypertensive hyperlipidemic patient. MRI BRAIN WITHOUT CONTRAST MRA HEAD WITHOUT CONTRAST Technique: Multiplanar, multiecho pulse sequences of the brain and surrounding structures were obtained according to standard protocol without intravenous contrast. Angiographic images of the head were obtained using MRA technique without contrast. Comparison: 01/31/2013 CT. No comparison MR. MRI HEAD Findings: No acute infarct. No intracranial hemorrhage. Moderate small vessel disease type changes. Global atrophy. Ventricular prominence may be explained by atrophy although mild hydrocephalus not excluded. Aqueduct is patent. No intracranial mass lesion detected on this unenhanced exam. Cervical medullary junction, pituitary region,  pineal region and orbital structures unremarkable. Minimal paranasal sinus mucosal thickening. IMPRESSION: No acute infarct. No intracranial hemorrhage. Moderate small vessel disease type changes. Global atrophy. Ventricular prominence may be explained by atrophy although mild hydrocephalus not excluded. No intracranial mass lesion detected on this unenhanced exam. MRA HEAD Findings: Ectatic internal carotid arteries. Mild narrowing supraclinoid aspect of  the left internal carotid artery. Mild narrowing A1 segment of the anterior cerebral artery bilaterally. Mild narrowing distal M1 segment right middle cerebral artery. Right vertebral artery ends in a PICA distribution. Mild narrowing and irregularity of the right vertebral artery and right PICA. Mild irregularity and narrowing of portions of the left vertebral artery. Mild to moderate narrowing proximal basilar artery with moderate to marked narrowing distal basilar artery. Nonvisualization AICAs. Moderate to marked long segment stenosis of the posterior cerebral artery proximal to mid aspect bilaterally. Branch vessel irregularity anterior and posterior circulation. No aneurysm identified. IMPRESSION: Intracranial atherosclerotic type changes more notable involving the posterior circulation as detailed above. Original Report Authenticated By: Lacy Duverney, M.D.   EKG: Independently reviewed.NSR with artifacts   ASSESSMENT AND PLAN:  Present on Admission:  . Vertigo  - Suspect benign paroxysmal positional Vertigo- as the patient gives a history of vertigo upon leaning forward.  - Admit to telemetry unit for the first 24 hours, will get vestibular physical therapy to assess him tomorrow for maneuvers to see if this will help  - As needed meclizine and as needed antiemetics  - Please note-per ED RN- not orthostatic   . BENIGN PROSTATIC HYPERTROPHY  - Continue Flomax   . HYPERTENSION  - Continue lisinopril and hydrochlorothiazide  Further plan will  depend as patient's clinical course evolves and further radiologic and laboratory data become available. Patient will be monitored closely.   DVT Prophylaxis:  Prophylactic Lovenox   Code Status:  Full Code- but does not want to be kept alive on a ventilator for a long time   Total time spent for admission equals 45 minutes.   [facility]  [facility]  Pager [phone]  If 7PM-7AM, please contact night-coverage  [email or web address]  Password [facility]  01/31/2013, 4:22 PM

## 2013-01-31 NOTE — ED Notes (Addendum)
C/o onset generalized weakness, nausea, emesis x1 onset upon awakening this morning. Denies diarrhea, abd pain. Given zofran 4mg  IV per EMS

## 2013-01-31 NOTE — ED Provider Notes (Addendum)
CSN: [id number]     Arrival date & time 01/31/13  0804 History   First MD Initiated Contact with Patient 01/31/13 0809     Chief Complaint  Patient presents with  . Weakness  . Emesis  . Nausea   (Consider location/radiation/quality/duration/timing/severity/associated sxs/prior Treatment) HPI Comments: Presents to the ER for evaluation of weakness, dizziness and nausea. Patient reports that upon awakening this morning he was extremely weak and dizzy. He describes it as the room spinning. He was unable to get out of bed because of the weakness and dizziness. Patient denies headache. He has had severe nausea but has not been able to vomit. Has not been abdominal pain, diarrhea or constipation. He has not any fever. Patient reports that he had similar symptoms many years ago one time, but the symptoms resolved before he can get to his doctor's office.  Patient is a 79 y.o. male presenting with weakness and vomiting.  Weakness Pertinent negatives include no chest pain, no abdominal pain, no headaches and no shortness of breath.  Emesis Associated symptoms: no abdominal pain and no headaches     Past Medical History  Diagnosis Date  . BPH (benign prostatic hyperplasia)   . Diverticulosis of colon   . History of barium enema 07/1991    Diverticuli  . History of CT scan of abdomen 08/14/05    with and without right adrenal adenoma-stable  . History of CT scan 08/14/05     pelvis with wnl  . Diabetes mellitus   . Hyperlipidemia   . Glaucoma   . Hypertension   . Chronic back pain    Past Surgical History  Procedure Laterality Date  . Prostate biopsy  07/13/98    benign, cytoscopy wnl  . Shoulder surgery  04/03    right, [place]  . Cataract extraction  04/01    left eye  . Cataract extraction  01/04     right eye   Family History  Problem Relation Age of Onset  . Cancer Mother     stomach  and colon  . Cancer Brother     possible lung cancer   History  Substance Use  Topics  . Smoking status: Never Smoker   . Smokeless tobacco: Never Used  . Alcohol Use: No    Review of Systems  Constitutional: Negative for fever.  Respiratory: Negative for shortness of breath.   Cardiovascular: Negative for chest pain.  Gastrointestinal: Positive for nausea and vomiting. Negative for abdominal pain.  Neurological: Positive for dizziness and weakness. Negative for headaches.  All other systems reviewed and are negative.    Allergies  Atenolol; Esomeprazole magnesium; and Ofloxacin  Home Medications   Current Outpatient Rx  Name  Route  Sig  Dispense  Refill  . aspirin EC 81 MG tablet   Oral   Take 81 mg by mouth daily.         Marland Kitchen latanoprost (XALATAN) 0.005 % ophthalmic solution   Right Eye   Place 1 drop into the right eye at bedtime.           Marland Kitchen lisinopril-hydrochlorothiazide (PRINZIDE,ZESTORETIC) 10-12.5 MG per tablet      TAKE ONE TABLET BY MOUTH EVERY DAY   30 tablet   6   . menthol-zinc oxide (GOLD BOND) powder   Topical   Apply topically 2 (two) times daily as needed.         . Naproxen Sodium (ALEVE) 220 MG CAPS   Oral   Take by  mouth 2 (two) times daily as needed.           . tamsulosin (FLOMAX) 0.4 MG CAPS capsule   Oral   Take 1 capsule (0.4 mg total) by mouth daily.   30 capsule   6   . timolol (TIMOPTIC) 0.5 % ophthalmic solution   Both Eyes   Place 1 drop into both eyes 2 (two) times daily.            SpO2 95% Physical Exam  Constitutional: He is oriented to person, place, and time. He appears well-developed and well-nourished. No distress.  HENT:  Head: Normocephalic and atraumatic.  Right Ear: Hearing normal.  Left Ear: Hearing normal.  Nose: Nose normal.  Mouth/Throat: Oropharynx is clear and moist and mucous membranes are normal.  Eyes: Conjunctivae and EOM are normal. Pupils are equal, round, and reactive to light.  Neck: Normal range of motion. Neck supple.  Cardiovascular: Regular rhythm, S1 normal  and S2 normal.  Exam reveals no gallop and no friction rub.   No murmur heard. Pulmonary/Chest: Effort normal and breath sounds normal. No respiratory distress. He exhibits no tenderness.  Abdominal: Soft. Normal appearance and bowel sounds are normal. There is no hepatosplenomegaly. There is no tenderness. There is no rebound, no guarding, no tenderness at McBurney's point and negative Murphy's sign. No hernia.  Musculoskeletal: Normal range of motion.  Neurological: He is alert and oriented to person, place, and time. He has normal strength. No cranial nerve deficit or sensory deficit. Coordination normal. GCS eye subscore is 4. GCS verbal subscore is 5. GCS motor subscore is 6.  Skin: Skin is warm, dry and intact. No rash noted. No cyanosis.  Psychiatric: He has a normal mood and affect. His speech is normal and behavior is normal. Thought content normal.    ED Course  Procedures (including critical care time)  EKG:  Date: 01/31/2013  Rate: 65  Rhythm: normal sinus rhythm  QRS Axis: normal  Intervals: normal  ST/T Wave abnormalities: normal  Conduction Disutrbances:right bundle branch block  Narrative Interpretation:   Old EKG Reviewed: none available   Labs Review Labs Reviewed  CBC WITH DIFFERENTIAL - Abnormal; Notable for the following:    WBC 12.3 (*)    Neutrophils Relative % 81 (*)    Neutro Abs 9.9 (*)    Lymphocytes Relative 11 (*)    All other components within normal limits  COMPREHENSIVE METABOLIC PANEL - Abnormal; Notable for the following:    Glucose, Bld 217 (*)    GFR calc non Af Amer 57 (*)    GFR calc Af Amer 66 (*)    All other components within normal limits  URINALYSIS, ROUTINE W REFLEX MICROSCOPIC - Abnormal; Notable for the following:    Glucose, UA 100 (*)    Hgb urine dipstick LARGE (*)    All other components within normal limits  PRO B NATRIURETIC PEPTIDE  TROPONIN I  LIPASE, BLOOD  URINE MICROSCOPIC-ADD ON   Imaging Review Ct Head Wo  Contrast  01/31/2013   *RADIOLOGY REPORT*  Clinical Data: Dizziness with nausea and vomiting; blurred vision  CT HEAD WITHOUT CONTRAST  Technique:  Contiguous axial images were obtained from the base of the skull through the vertex without contrast. Study was obtained within 24 hours of patient arrival at the emergency department.  Comparison: None.  Findings:  There is diffuse atrophy, moderately severe.  There is no mass, hemorrhage, extra-axial fluid collection, or midline shift.  There is  small vessel disease throughout the centra semiovale bilaterally.  No acute appearing infarct is seen. Elsewhere, gray-white compartments are normal.  The bony calvarium appears intact.  The mastoid air cells are clear.  Cataracts are noted bilaterally.  IMPRESSION:   Diffuse atrophy with supratentorial small vessel disease.  No apparent mass, hemorrhage, or acute appearing infarct.   Original Report Authenticated By: Bretta Bang, M.D.    MDM  Diagnosis: Vertigo  Patient presents to the ER for evaluation of dizziness. This describes the dizziness as spinning sensation. His neurologic examination was normal. He has normal cerebellar function. CT scan head was unremarkable. Lab work was unremarkable as well, other than slightly elevated glucose and also microscopic blood in his urine. Nothing to explain the patient's current symptoms, however.  The CAT scan did not show anything and the patient remianed symptomatic. Therefore MRI was ordered to evaluate for a possible CVA. MRA was ordered because patients was suspected of having stenotic-occlusive disease of the mid or large size intracranial arteries, based on age and symptoms. MRA did confirm significant atherosclerotic diseases, specifically the posterior circulation which likely explains the patient's symptoms. Patient will be admitted to observation for further management of his presumed central vertigo.    Gilda Crease, MD 01/31/13  1555  Gilda Crease, MD 02/04/13 [phone]

## 2013-01-31 NOTE — ED Notes (Signed)
Second report attempt.

## 2013-01-31 NOTE — ED Notes (Signed)
Attempted to call report. Was told pt is no longer going to that room or floor after speaking with the admitting doctor.

## 2013-01-31 NOTE — ED Notes (Signed)
Pt requested something to drink and was notified about delay. Pt notified that Dr Blinda Leatherwood was consulting to hospitalist to see about possible admission. Family remains at bedside

## 2013-02-01 DIAGNOSIS — M549 Dorsalgia, unspecified: Secondary | ICD-10-CM

## 2013-02-01 DIAGNOSIS — R42 Dizziness and giddiness: Secondary | ICD-10-CM

## 2013-02-01 DIAGNOSIS — G8929 Other chronic pain: Secondary | ICD-10-CM

## 2013-02-01 LAB — BASIC METABOLIC PANEL
BUN: 19 mg/dL (ref 6–23)
Chloride: 102 mEq/L (ref 96–112)
Creatinine, Ser: 1.15 mg/dL (ref 0.50–1.35)
GFR calc Af Amer: 60 mL/min — ABNORMAL LOW (ref >90)
GFR calc non Af Amer: 52 mL/min — ABNORMAL LOW (ref >90)
Potassium: 3.9 mEq/L (ref 3.5–5.1)

## 2013-02-01 LAB — CBC
HCT: 42.8 % (ref 39.0–52.0)
MCHC: 33.2 g/dL (ref 30.0–36.0)
Platelets: 187 10*3/uL (ref 150–400)
RDW: 13.1 % (ref 11.5–15.5)
WBC: 10.5 10*3/uL (ref 4.0–10.5)

## 2013-02-01 NOTE — Progress Notes (Signed)
Physician Discharge Summary  Ronald Ball ZOX:[id number] DOB: 07-19-1913 DOA: 01/31/2013  PCP: Eustaquio Boyden, MD  Admit date: 01/31/2013 Discharge date: 02/01/2013  Time spent: 40 minutes  Recommendations for Outpatient Follow-up:  1. Follow up with Primary care physician in 1 week  Discharge Diagnoses:  Principal Problem:   Dizziness and giddiness Active Problems:   HYPERTENSION   BENIGN PROSTATIC HYPERTROPHY   Vertigo   Discharge Condition: Stable  Diet recommendation: Heart healthy diet  There were no vitals filed for this visit.  History of present illness:  Ronald Ball is a 79 y.o. male with a Past Medical History of hypertension, BPH who presents today with the above noted complaint. Per patient, he woke this morning and as soon as he attempted to sit up in bed, he started having vertigo-which he describes as the room spinning around him. He could not even walk to the bathroom. She never had vomiting, but has some episodes of nausea. He was then brought to the hospital for further evaluation and treatment, a CT scan of the head was negative. A MRI of the brain did not show any CVA, however a MRA brain did show some narrowing of the vessels in his posterior circulation. Since the patient was unable to ambulate safely, he he was referred to the hospitalist service for admission and evaluation.  There is no history of chest pain, shortness of breath, double vision, dysarthria or difficulty speaking.   Hospital Course:   1. Dizziness: Patient presents to the hospital with dizziness and near syncope, patient said he has history of vertigo upon leaning forward, he was describing the feeling of the room spinning around him yesterday, today he mentioned to a different thing that his vision got blurry and things were floating up and down in front of him. However all thesesymptoms resolved,  patient was seen by PT and the vestibular examination by Dix-Hallpike maneuver was  negative for peripheral vertigo. Patient was on telemetry, was negative for any bradycardia or tachyarrhythmia. Negative for orthostatic hypotension. Prior to discharge telemetry monitor was reviewed and showed SVT of about 5 seconds documented at around 12:30 PM on the day of discharge. I want to mask the patient again he denies any dizziness or chest pain. I instructed the patient to Timentin to his primary care physician if he developed any new dizziness episodes, he might need to be put on beta blockers.  2. Hypertension: Patient is on lisinopril and hydrochlorothiazide, patient instructed if dizziness recurs again to call his primary care physician. Probably be taken off of the hydrochlorothiazide which is associated with orthostatic hypotension.  3. Benign prostatic hypertrophy: Patient is on Flomax, no symptoms.  Procedures:  None  Consultations:  PT/OT  Discharge Exam: Filed Vitals:   02/01/13 0454  BP: 130/64  Pulse: 90  Temp: 98 F (36.7 C)  Resp: 20   General: Alert and awake, oriented x3, not in any acute distress. HEENT: anicteric sclera, pupils reactive to light and accommodation, EOMI CVS: S1-S2 clear, no murmur rubs or gallops Chest: clear to auscultation bilaterally, no wheezing, rales or rhonchi Abdomen: soft nontender, nondistended, normal bowel sounds, no organomegaly Extremities: no cyanosis, clubbing or edema noted bilaterally Neuro: Cranial nerves II-XII intact, no focal neurological deficits  Discharge Instructions  Discharge Orders   Future Orders Complete By Expires   Diet - low sodium heart healthy  As directed    Increase activity slowly  As directed        Medication List  ALEVE 220 MG Caps  Generic drug:  Naproxen Sodium  Take by mouth 2 (two) times daily as needed.     aspirin EC 81 MG tablet  Take 81 mg by mouth daily.     latanoprost 0.005 % ophthalmic solution  Commonly known as:  XALATAN  Place 1 drop into the right eye at  bedtime.     lisinopril-hydrochlorothiazide 10-12.5 MG per tablet  Commonly known as:  PRINZIDE,ZESTORETIC  Take 1 tablet by mouth daily.     menthol-zinc oxide powder  Apply topically 2 (two) times daily as needed.     tamsulosin 0.4 MG Caps capsule  Commonly known as:  FLOMAX  Take 1 capsule (0.4 mg total) by mouth daily.     timolol 0.5 % ophthalmic solution  Commonly known as:  TIMOPTIC  Place 1 drop into both eyes 2 (two) times daily.       Allergies  Allergen Reactions  . Atenolol     REACTION: ? Side effect  . Esomeprazole Magnesium     REACTION: abd swelling  . Ofloxacin     REACTION: itching       Follow-up Information   Follow up with Eustaquio Boyden, MD In 1 week.   Specialty:  Family Medicine   Contact information:   [address] [place] [address] [phone]        The results of significant diagnostics from this hospitalization (including imaging, microbiology, ancillary and laboratory) are listed below for reference.    Significant Diagnostic Studies: Ct Head Wo Contrast  01/31/2013   *RADIOLOGY REPORT*  Clinical Data: Dizziness with nausea and vomiting; blurred vision  CT HEAD WITHOUT CONTRAST  Technique:  Contiguous axial images were obtained from the base of the skull through the vertex without contrast. Study was obtained within 24 hours of patient arrival at the emergency department.  Comparison: None.  Findings:  There is diffuse atrophy, moderately severe.  There is no mass, hemorrhage, extra-axial fluid collection, or midline shift.  There is small vessel disease throughout the centra semiovale bilaterally.  No acute appearing infarct is seen. Elsewhere, gray-white compartments are normal.  The bony calvarium appears intact.  The mastoid air cells are clear.  Cataracts are noted bilaterally.  IMPRESSION:   Diffuse atrophy with supratentorial small vessel disease.  No apparent mass, hemorrhage, or acute appearing infarct.   Original  Report Authenticated By: Bretta Bang, M.D.   Mr Angiogram Head Wo Contrast  01/31/2013   *RADIOLOGY REPORT*  Clinical Data:  Vertigo.  Generalized weakness.  Nausea.  Vomiting. Diabetic hypertensive hyperlipidemic patient.  MRI BRAIN WITHOUT CONTRAST MRA HEAD WITHOUT CONTRAST  Technique: Multiplanar, multiecho pulse sequences of the brain and surrounding structures were obtained according to standard protocol without intravenous contrast.  Angiographic images of the head were obtained using MRA technique without contrast.  Comparison: 01/31/2013 CT.  No comparison MR.  MRI HEAD  Findings:  No acute infarct.  No intracranial hemorrhage.  Moderate small vessel disease type changes.  Global atrophy.  Ventricular prominence may be explained by atrophy although mild hydrocephalus not excluded.  Aqueduct is patent.  No intracranial mass lesion detected on this unenhanced exam.  Cervical medullary junction, pituitary region, pineal region and orbital structures unremarkable.  Minimal paranasal sinus mucosal thickening.  IMPRESSION: No acute infarct.  No intracranial hemorrhage.  Moderate small vessel disease type changes.  Global atrophy.  Ventricular prominence may be explained by atrophy although mild hydrocephalus not excluded.  No intracranial mass  lesion detected on this unenhanced exam.  MRA HEAD  Findings: Ectatic internal carotid arteries.  Mild narrowing supraclinoid aspect of the left internal carotid artery.  Mild narrowing A1 segment of the anterior cerebral artery bilaterally.  Mild narrowing distal M1 segment right middle cerebral artery.  Right vertebral artery ends in a PICA distribution.  Mild narrowing and irregularity of the right vertebral artery and right PICA.  Mild irregularity and narrowing of portions of the left vertebral artery.  Mild to moderate narrowing proximal basilar artery with moderate to marked narrowing distal basilar artery.  Nonvisualization AICAs.  Moderate to marked long  segment stenosis of the posterior cerebral artery proximal to mid aspect bilaterally.  Branch vessel irregularity anterior and posterior circulation.  No aneurysm identified.  IMPRESSION: Intracranial atherosclerotic type changes more notable involving the posterior circulation as detailed above.   Original Report Authenticated By: Lacy Duverney, M.D.   Mr Brain Wo Contrast  01/31/2013   *RADIOLOGY REPORT*  Clinical Data:  Vertigo.  Generalized weakness.  Nausea.  Vomiting. Diabetic hypertensive hyperlipidemic patient.  MRI BRAIN WITHOUT CONTRAST MRA HEAD WITHOUT CONTRAST  Technique: Multiplanar, multiecho pulse sequences of the brain and surrounding structures were obtained according to standard protocol without intravenous contrast.  Angiographic images of the head were obtained using MRA technique without contrast.  Comparison: 01/31/2013 CT.  No comparison MR.  MRI HEAD  Findings:  No acute infarct.  No intracranial hemorrhage.  Moderate small vessel disease type changes.  Global atrophy.  Ventricular prominence may be explained by atrophy although mild hydrocephalus not excluded.  Aqueduct is patent.  No intracranial mass lesion detected on this unenhanced exam.  Cervical medullary junction, pituitary region, pineal region and orbital structures unremarkable.  Minimal paranasal sinus mucosal thickening.  IMPRESSION: No acute infarct.  No intracranial hemorrhage.  Moderate small vessel disease type changes.  Global atrophy.  Ventricular prominence may be explained by atrophy although mild hydrocephalus not excluded.  No intracranial mass lesion detected on this unenhanced exam.  MRA HEAD  Findings: Ectatic internal carotid arteries.  Mild narrowing supraclinoid aspect of the left internal carotid artery.  Mild narrowing A1 segment of the anterior cerebral artery bilaterally.  Mild narrowing distal M1 segment right middle cerebral artery.  Right vertebral artery ends in a PICA distribution.  Mild narrowing and  irregularity of the right vertebral artery and right PICA.  Mild irregularity and narrowing of portions of the left vertebral artery.  Mild to moderate narrowing proximal basilar artery with moderate to marked narrowing distal basilar artery.  Nonvisualization AICAs.  Moderate to marked long segment stenosis of the posterior cerebral artery proximal to mid aspect bilaterally.  Branch vessel irregularity anterior and posterior circulation.  No aneurysm identified.  IMPRESSION: Intracranial atherosclerotic type changes more notable involving the posterior circulation as detailed above.   Original Report Authenticated By: Lacy Duverney, M.D.    Microbiology: No results found for this or any previous visit (from the past 240 hour(s)).   Labs: Basic Metabolic Panel:  Recent Labs Lab 01/31/13 0820 02/01/13 0549  NA 138 141  K 4.0 3.9  CL 98 102  CO2 22 29  GLUCOSE 217* 134*  BUN 23 19  CREATININE 1.06 1.15  CALCIUM 9.2 9.1   Liver Function Tests:  Recent Labs Lab 01/31/13 0820  AST 20  ALT 14  ALKPHOS 82  BILITOT 0.5  PROT 6.7  ALBUMIN 3.6    Recent Labs Lab 01/31/13 0820  LIPASE 17   No results found  for this basename: AMMONIA,  in the last 168 hours CBC:  Recent Labs Lab 01/31/13 0820 02/01/13 0549  WBC 12.3* 10.5  NEUTROABS 9.9*  --   HGB 14.9 14.2  HCT 44.4 42.8  MCV 89.9 90.5  PLT 203 187   Cardiac Enzymes:  Recent Labs Lab 01/31/13 0820  TROPONINI <0.30   BNP: BNP (last 3 results)  Recent Labs  01/31/13 0820  PROBNP 159.8   CBG: No results found for this basename: GLUCAP,  in the last 168 hours     Signed:  Betzaida Cremeens A  [facility] 02/01/2013, 1:05 PM

## 2013-02-01 NOTE — Evaluation (Signed)
Physical Therapy Vestibular Evaluation/Discharge Patient Details Name: Ronald Ball MRN: [id number] DOB: 09-15-1913 Today's Date: 02/01/2013 Time: 4098-1191 PT Time Calculation (min): 26 min  PT Assessment / Plan / Recommendation History of Present Illness  79 y.o. male admitted to [facility] on 01/31/13 with a Past Medical History of hypertension, BPH who presents with vertigo. Per patient, he woke and as soon as he attempted to sit up in bed, he started having vertigo-which he describes as the room spinning around him. He could not even walk to the bathroom. He never had vomiting, but has some episodes of nausea. He was then brought to the hospital for further evaluation and treatment, a CT scan of the head was negative. A MRI of the brain did not show any CVA, however a MRA brain did show some narrowing of the vessels in his posterior circulation.   Clinical Impression  Vestibular testing completed.  Oculomotor testing, log roll test, and [employer] all (-) for symptoms.  Pt is no longer having any sensation of dizziness or room spinning.  He has returned to his very impressive baseline for his age.  He does not need any acute or f/u PT at discharge.  PT to sign off.      PT Assessment  Patent does not need any further PT services    Follow Up Recommendations  No PT follow up    Does the patient have the potential to tolerate intense rehabilitation     NA  Barriers to Discharge   None      Equipment Recommendations  None recommended by PT    Recommendations for Other Services   None  Frequency   NA- one time eval   Precautions / Restrictions Precautions Precautions: None   Pertinent Vitals/Pain See vitals flow sheet.       Mobility  Bed Mobility Bed Mobility: Supine to Sit;Sitting - Scoot to Edge of Bed;Sit to Supine Supine to Sit: 7: Independent Sitting - Scoot to Edge of Bed: 7: Independent Sit to Supine: 7: Independent Details for Bed Mobility Assistance: bed flat, no  rails Transfers Transfers: Sit to Stand;Stand to Sit Sit to Stand: 6: Modified independent (Device/Increase time);With upper extremity assist Stand to Sit: 6: Modified independent (Device/Increase time);With upper extremity assist Details for Transfer Assistance: uses hands to stand Ambulation/Gait Ambulation/Gait Assistance: 6: Modified independent (Device/Increase time) Ambulation Distance (Feet): 300 Feet Assistive device: None Ambulation/Gait Assistance Details: no LOB, gait speed a bit slow, but not imbalanced.   Gait Pattern: Step-through pattern;Shuffle;Trunk flexed (this is likely baseline) Gait velocity: decreased, but not specifically measured        PT Goals(Current goals can be found in the care plan section) Acute Rehab PT Goals Patient Stated Goal: to go home today PT Goal Formulation: No goals set, d/c therapy  Visit Information  Last PT Received On: 02/01/13 Assistance Needed: +1 History of Present Illness: 79 y.o. male admitted to [facility] on 01/31/13 with a Past Medical History of hypertension, BPH who presents with vertigo. Per patient, he woke and as soon as he attempted to sit up in bed, he started having vertigo-which he describes as the room spinning around him. He could not even walk to the bathroom. He never had vomiting, but has some episodes of nausea. He was then brought to the hospital for further evaluation and treatment, a CT scan of the head was negative. A MRI of the brain did not show any CVA, however a MRA brain did show some narrowing  of the vessels in his posterior circulation.        Prior Functioning  Home Living Family/patient expects to be discharged to:: Private residence Available Help at Discharge: Family;Available 24 hours/day;Available PRN/intermittently (spouse 24/7, son daily check in-stops by) Type of Home: House Home Access: Stairs to enter;Ramped entrance Entrance Stairs-Number of Steps: 1 Entrance Stairs-Rails: None Home Layout: Two  level Alternate Level Stairs-Number of Steps:  (pt has a lift chair to get to the second story) Home Equipment: Walker - 2 wheels;Cane - single point;Bedside commode;Shower seat;Grab bars - toilet;Grab bars - tub/shower;Wheelchair - IT trainer;Other (comment) (left chair for second story.  "I have it all") Prior Function Level of Independence: Independent Comments: PTA did not use an assistive device for gait.  Reports h/o fall 5 hears ago tripping over tree root in the yard Communication Communication: HOH    Cognition  Cognition Arousal/Alertness: Awake/alert Behavior During Therapy: WFL for tasks assessed/performed Overall Cognitive Status: Within Functional Limits for tasks assessed    Extremity/Trunk Assessment Upper Extremity Assessment Upper Extremity Assessment: Overall WFL for tasks assessed Lower Extremity Assessment Lower Extremity Assessment: Overall WFL for tasks assessed Cervical / Trunk Assessment Cervical / Trunk Assessment: Normal (reports h/o low back problems)   Balance Balance Balance Assessed: Yes Static Sitting Balance Static Sitting - Balance Support: No upper extremity supported;Feet supported Static Sitting - Level of Assistance: 7: Independent Dynamic Sitting Balance Dynamic Sitting - Balance Support: Right upper extremity supported;Left upper extremity supported;Feet supported Dynamic Sitting - Level of Assistance: 6: Modified independent (Device/Increase time) (one upper extremity supported for balance.  ) Static Standing Balance Static Standing - Balance Support: No upper extremity supported Static Standing - Level of Assistance: 7: Independent Dynamic Standing Balance Dynamic Standing - Balance Support: No upper extremity supported Dynamic Standing - Level of Assistance: 6: Modified independent (Device/Increase time) (due to increased time and at times one hand to support himse) High Level Balance High Level Balance Activites: Turns;Other  (comment) (high knee marches) High Level Balance Comments: supervision  End of Session PT - End of Session Activity Tolerance: Patient tolerated treatment well Patient left: in bed;with call bell/phone within reach;with family/visitor present (seated EOB) Nurse Communication: Mobility status;Other (comment) (no need for f/u to RN CM)  GP Functional Assessment Tool Used: assist level Functional Limitation: Mobility: Walking and moving around Mobility: Walking and Moving Around Current Status [phone]): 0 percent impaired, limited or restricted Mobility: Walking and Moving Around Goal Status (U0454): 0 percent impaired, limited or restricted Mobility: Walking and Moving Around Discharge Status [phone]): 0 percent impaired, limited or restricted   [place] Natarajan B. Jaiyon Wander, PT, DPT [phone]   02/01/2013, 11:53 AM

## 2013-02-01 NOTE — Progress Notes (Signed)
OT Cancellation Note  Patient Details Name: Ronald Ball MRN: [id number] DOB: January 16, 1914   Cancelled Treatment:    Reason Eval/Treat Not Completed: Other (comment). Pt screened by PT and no OT needs identified at this time  Galen Manila 02/01/2013, 10:36 AM

## 2013-02-02 NOTE — Discharge Summary (Addendum)
Physician Discharge Summary  [name]:[id number] DOB: 06/19/1913 DOA: 01/31/2013  PCP: Eustaquio Boyden, MD  Admit date: 01/31/2013 Discharge date: 02/02/2013  Time spent: 40 minutes  Recommendations for Outpatient Follow-up:  1. Follow up with Primary care physician in 1 week  Discharge Diagnoses:  Principal Problem:   Dizziness and giddiness Active Problems:   HYPERTENSION   BENIGN PROSTATIC HYPERTROPHY   Vertigo   Discharge Condition: Stable  Diet recommendation: Heart healthy diet  There were no vitals filed for this visit.  History of present illness:  Ronald Ball is a 79 y.o. male with a Past Medical History of hypertension, BPH who presents today with the above noted complaint. Per patient, he woke this morning and as soon as he attempted to sit up in bed, he started having vertigo-which he describes as the room spinning around him. He could not even walk to the bathroom. She never had vomiting, but has some episodes of nausea. He was then brought to the hospital for further evaluation and treatment, a CT scan of the head was negative. A MRI of the brain did not show any CVA, however a MRA brain did show some narrowing of the vessels in his posterior circulation. Since the patient was unable to ambulate safely, he he was referred to the hospitalist service for admission and evaluation.  There is no history of chest pain, shortness of breath, double vision, dysarthria or difficulty speaking.   Hospital Course:   1. Dizziness: Patient presents to the hospital with dizziness and near syncope, patient said he has history of vertigo upon leaning forward, he was describing the feeling of the room spinning around him yesterday, today he mentioned to a different thing that his vision got blurry and things were floating up and down in front of him. However all thesesymptoms resolved,  patient was seen by PT and the vestibular examination by Dix-Hallpike maneuver was  negative for peripheral vertigo. Patient was on telemetry, was negative for any bradycardia or tachyarrhythmia. Negative for orthostatic hypotension. Prior to discharge telemetry monitor was reviewed and showed SVT of about 5 seconds documented at around 12:30 PM on the day of discharge. I want to mask the patient again he denies any dizziness or chest pain. I instructed the patient to Timentin to his primary care physician if he developed any new dizziness episodes, he might need to be put on beta blockers.  2. Hypertension: Patient is on lisinopril and hydrochlorothiazide, patient instructed if dizziness recurs again to call his primary care physician. Probably be taken off of the hydrochlorothiazide which is associated with orthostatic hypotension.  3. Benign prostatic hypertrophy: Patient is on Flomax, no symptoms.  Procedures:  None  Consultations:  PT/OT  Discharge Exam: Filed Vitals:   02/01/13 0454  BP: 130/64  Pulse: 90  Temp: 98 F (36.7 C)  Resp: 20   General: Alert and awake, oriented x3, not in any acute distress. HEENT: anicteric sclera, pupils reactive to light and accommodation, EOMI CVS: S1-S2 clear, no murmur rubs or gallops Chest: clear to auscultation bilaterally, no wheezing, rales or rhonchi Abdomen: soft nontender, nondistended, normal bowel sounds, no organomegaly Extremities: no cyanosis, clubbing or edema noted bilaterally Neuro: Cranial nerves II-XII intact, no focal neurological deficits  Discharge Instructions      Discharge Orders   Future Orders Complete By Expires   Diet - low sodium heart healthy  As directed    Increase activity slowly  As directed  Medication List         ALEVE 220 MG Caps  Generic drug:  Naproxen Sodium  Take by mouth 2 (two) times daily as needed.     aspirin EC 81 MG tablet  Take 81 mg by mouth daily.     latanoprost 0.005 % ophthalmic solution  Commonly known as:  XALATAN  Place 1 drop into the right  eye at bedtime.     lisinopril-hydrochlorothiazide 10-12.5 MG per tablet  Commonly known as:  PRINZIDE,ZESTORETIC  Take 1 tablet by mouth daily.     menthol-zinc oxide powder  Apply topically 2 (two) times daily as needed.     tamsulosin 0.4 MG Caps capsule  Commonly known as:  FLOMAX  Take 1 capsule (0.4 mg total) by mouth daily.     timolol 0.5 % ophthalmic solution  Commonly known as:  TIMOPTIC  Place 1 drop into both eyes 2 (two) times daily.       Allergies  Allergen Reactions  . Atenolol     REACTION: ? Side effect  . Esomeprazole Magnesium     REACTION: abd swelling  . Ofloxacin     REACTION: itching   Follow-up Information   Follow up with Eustaquio Boyden, MD In 1 week.   Specialty:  Family Medicine   Contact information:   [address] [place] [address] [phone]        The results of significant diagnostics from this hospitalization (including imaging, microbiology, ancillary and laboratory) are listed below for reference.    Significant Diagnostic Studies: Ct Head Wo Contrast  01/31/2013   *RADIOLOGY REPORT*  Clinical Data: Dizziness with nausea and vomiting; blurred vision  CT HEAD WITHOUT CONTRAST  Technique:  Contiguous axial images were obtained from the base of the skull through the vertex without contrast. Study was obtained within 24 hours of patient arrival at the emergency department.  Comparison: None.  Findings:  There is diffuse atrophy, moderately severe.  There is no mass, hemorrhage, extra-axial fluid collection, or midline shift.  There is small vessel disease throughout the centra semiovale bilaterally.  No acute appearing infarct is seen. Elsewhere, gray-white compartments are normal.  The bony calvarium appears intact.  The mastoid air cells are clear.  Cataracts are noted bilaterally.  IMPRESSION:   Diffuse atrophy with supratentorial small vessel disease.  No apparent mass, hemorrhage, or acute appearing infarct.   Original  Report Authenticated By: Bretta Bang, M.D.   Mr Angiogram Head Wo Contrast  01/31/2013   *RADIOLOGY REPORT*  Clinical Data:  Vertigo.  Generalized weakness.  Nausea.  Vomiting. Diabetic hypertensive hyperlipidemic patient.  MRI BRAIN WITHOUT CONTRAST MRA HEAD WITHOUT CONTRAST  Technique: Multiplanar, multiecho pulse sequences of the brain and surrounding structures were obtained according to standard protocol without intravenous contrast.  Angiographic images of the head were obtained using MRA technique without contrast.  Comparison: 01/31/2013 CT.  No comparison MR.  MRI HEAD  Findings:  No acute infarct.  No intracranial hemorrhage.  Moderate small vessel disease type changes.  Global atrophy.  Ventricular prominence may be explained by atrophy although mild hydrocephalus not excluded.  Aqueduct is patent.  No intracranial mass lesion detected on this unenhanced exam.  Cervical medullary junction, pituitary region, pineal region and orbital structures unremarkable.  Minimal paranasal sinus mucosal thickening.  IMPRESSION: No acute infarct.  No intracranial hemorrhage.  Moderate small vessel disease type changes.  Global atrophy.  Ventricular prominence may be explained by atrophy although mild hydrocephalus  not excluded.  No intracranial mass lesion detected on this unenhanced exam.  MRA HEAD  Findings: Ectatic internal carotid arteries.  Mild narrowing supraclinoid aspect of the left internal carotid artery.  Mild narrowing A1 segment of the anterior cerebral artery bilaterally.  Mild narrowing distal M1 segment right middle cerebral artery.  Right vertebral artery ends in a PICA distribution.  Mild narrowing and irregularity of the right vertebral artery and right PICA.  Mild irregularity and narrowing of portions of the left vertebral artery.  Mild to moderate narrowing proximal basilar artery with moderate to marked narrowing distal basilar artery.  Nonvisualization AICAs.  Moderate to marked long  segment stenosis of the posterior cerebral artery proximal to mid aspect bilaterally.  Branch vessel irregularity anterior and posterior circulation.  No aneurysm identified.  IMPRESSION: Intracranial atherosclerotic type changes more notable involving the posterior circulation as detailed above.   Original Report Authenticated By: Lacy Duverney, M.D.   Mr Brain Wo Contrast  01/31/2013   *RADIOLOGY REPORT*  Clinical Data:  Vertigo.  Generalized weakness.  Nausea.  Vomiting. Diabetic hypertensive hyperlipidemic patient.  MRI BRAIN WITHOUT CONTRAST MRA HEAD WITHOUT CONTRAST  Technique: Multiplanar, multiecho pulse sequences of the brain and surrounding structures were obtained according to standard protocol without intravenous contrast.  Angiographic images of the head were obtained using MRA technique without contrast.  Comparison: 01/31/2013 CT.  No comparison MR.  MRI HEAD  Findings:  No acute infarct.  No intracranial hemorrhage.  Moderate small vessel disease type changes.  Global atrophy.  Ventricular prominence may be explained by atrophy although mild hydrocephalus not excluded.  Aqueduct is patent.  No intracranial mass lesion detected on this unenhanced exam.  Cervical medullary junction, pituitary region, pineal region and orbital structures unremarkable.  Minimal paranasal sinus mucosal thickening.  IMPRESSION: No acute infarct.  No intracranial hemorrhage.  Moderate small vessel disease type changes.  Global atrophy.  Ventricular prominence may be explained by atrophy although mild hydrocephalus not excluded.  No intracranial mass lesion detected on this unenhanced exam.  MRA HEAD  Findings: Ectatic internal carotid arteries.  Mild narrowing supraclinoid aspect of the left internal carotid artery.  Mild narrowing A1 segment of the anterior cerebral artery bilaterally.  Mild narrowing distal M1 segment right middle cerebral artery.  Right vertebral artery ends in a PICA distribution.  Mild narrowing and  irregularity of the right vertebral artery and right PICA.  Mild irregularity and narrowing of portions of the left vertebral artery.  Mild to moderate narrowing proximal basilar artery with moderate to marked narrowing distal basilar artery.  Nonvisualization AICAs.  Moderate to marked long segment stenosis of the posterior cerebral artery proximal to mid aspect bilaterally.  Branch vessel irregularity anterior and posterior circulation.  No aneurysm identified.  IMPRESSION: Intracranial atherosclerotic type changes more notable involving the posterior circulation as detailed above.   Original Report Authenticated By: Lacy Duverney, M.D.    Microbiology: No results found for this or any previous visit (from the past 240 hour(s)).   Labs: Basic Metabolic Panel:  Recent Labs Lab 01/31/13 0820 02/01/13 0549  NA 138 141  K 4.0 3.9  CL 98 102  CO2 22 29  GLUCOSE 217* 134*  BUN 23 19  CREATININE 1.06 1.15  CALCIUM 9.2 9.1   Liver Function Tests:  Recent Labs Lab 01/31/13 0820  AST 20  ALT 14  ALKPHOS 82  BILITOT 0.5  PROT 6.7  ALBUMIN 3.6    Recent Labs Lab 01/31/13 0820  LIPASE  17   No results found for this basename: AMMONIA,  in the last 168 hours CBC:  Recent Labs Lab 01/31/13 0820 02/01/13 0549  WBC 12.3* 10.5  NEUTROABS 9.9*  --   HGB 14.9 14.2  HCT 44.4 42.8  MCV 89.9 90.5  PLT 203 187   Cardiac Enzymes:  Recent Labs Lab 01/31/13 0820  TROPONINI <0.30   BNP: BNP (last 3 results)  Recent Labs  01/31/13 0820  PROBNP 159.8   CBG: No results found for this basename: GLUCAP,  in the last 168 hours     Signed:  Minami Arriaga A  [facility] 02/02/2013, 4:53 PM

## 2013-02-03 ENCOUNTER — Telehealth: Payer: Self-pay | Admitting: Family Medicine

## 2013-02-03 NOTE — Telephone Encounter (Signed)
Pt discharged yesterday with dx dizziness/vertigo. Can we call today or Monday for f/u - ensure no further dizzy episodes, and schedule hosp f/u appt in 1-2 wks with me if pt desires.

## 2013-02-06 NOTE — Telephone Encounter (Signed)
Pt doing much better per wife. Appt scheduled for 9/12.

## 2013-02-07 ENCOUNTER — Encounter: Payer: Self-pay | Admitting: Family Medicine

## 2013-02-07 ENCOUNTER — Ambulatory Visit (INDEPENDENT_AMBULATORY_CARE_PROVIDER_SITE_OTHER): Payer: Medicare Other | Admitting: Family Medicine

## 2013-02-07 VITALS — BP 132/64 | HR 68 | Temp 98.0°F | Wt 205.2 lb

## 2013-02-07 DIAGNOSIS — I10 Essential (primary) hypertension: Secondary | ICD-10-CM

## 2013-02-07 DIAGNOSIS — N400 Enlarged prostate without lower urinary tract symptoms: Secondary | ICD-10-CM

## 2013-02-07 DIAGNOSIS — R42 Dizziness and giddiness: Secondary | ICD-10-CM

## 2013-02-07 MED ORDER — FINASTERIDE 5 MG PO TABS: 5.0000 mg | ORAL_TABLET | Freq: Every day | ORAL | Status: DC

## 2013-02-07 NOTE — Progress Notes (Signed)
Subjective:    Patient ID: Ronald Ball, male    DOB: Mar 04, 1914, 79 y.o.   MRN: [id number]  HPI CC: hosp f/u visit  Ronald Ball presents today as f/u of recent hospitalization for dizziness/vertigo.  Describes sudden episode of vertigo with nausea when he got up out of bed on morning of admission. This lasted several hours. Never LOC. No presyncope.   Notes, blood work, and imaging studies from hospital reviewed. Imaging showing moderate to marked narrowing distal basilar artery and moderate to marked long segment stenosis of the posterior cerebral artery proximal to mid aspect bilaterally. 5 seconds of SVT on telemetry prior to discharge - pt asymptomatic with this. Latest new medicine - flomax for BPH sxs (not helping). flomax may have caused some dizziness so he stopped taking this.  Rare aleve use. Some constipation - improves with MOM or stool softener. BP Readings from Last 3 Encounters:  02/07/13 132/64  02/01/13 130/64  01/17/13 126/70   Overall feeling well since hospital discharge.  Occasional dizziness, different from vertigo episode that led him to ER evaluation.  F/u phone call date: 02/06/2013 (after weekend) Admit date: 01/31/2013  Discharge date: 02/02/2013  Time spent: 40 minutes  Recommendations for Outpatient Follow-up:  1. Follow up with Primary care physician in 1 week Discharge Diagnoses:  Principal Problem:  Dizziness and giddiness  Active Problems:  HYPERTENSION  BENIGN PROSTATIC HYPERTROPHY  Vertigo   Hospital Course:  1. Dizziness: Patient presents to the hospital with dizziness and near syncope, patient said he has history of vertigo upon leaning forward, he was describing the feeling of the room spinning around him yesterday, today he mentioned to a different thing that his vision got blurry and things were floating up and down in front of him. However all thesesymptoms resolved, patient was seen by PT and the vestibular examination by Dix-Hallpike  maneuver was negative for peripheral vertigo. Patient was on telemetry, was negative for any bradycardia or tachyarrhythmia. Negative for orthostatic hypotension.  Prior to discharge telemetry monitor was reviewed and showed SVT of about 5 seconds documented at around 12:30 PM on the day of discharge. I want to mask the patient again he denies any dizziness or chest pain. I instructed the patient to Timentin to his primary care physician if he developed any new dizziness episodes, he might need to be put on beta blockers.  2. Hypertension: Patient is on lisinopril and hydrochlorothiazide, patient instructed if dizziness recurs again to call his primary care physician. Probably be taken off of the hydrochlorothiazide which is associated with orthostatic hypotension.  3. Benign prostatic hypertrophy: Patient is on Flomax, no symptoms.   Procedures:  None Consultations:  PT/OT  Ct Head Wo Contrast  01/31/2013 *RADIOLOGY REPORT* Clinical Data: Dizziness with nausea and vomiting; blurred vision CT HEAD WITHOUT CONTRAST Technique: Contiguous axial images were obtained from the base of the skull through the vertex without contrast. Study was obtained within 24 hours of patient arrival at the emergency department. Comparison: None. Findings: There is diffuse atrophy, moderately severe. There is no mass, hemorrhage, extra-axial fluid collection, or midline shift. There is small vessel disease throughout the centra semiovale bilaterally. No acute appearing infarct is seen. Elsewhere, gray-white compartments are normal. The bony calvarium appears intact. The mastoid air cells are clear. Cataracts are noted bilaterally. IMPRESSION: Diffuse atrophy with supratentorial small vessel disease. No apparent mass, hemorrhage, or acute appearing infarct. Original Report Authenticated By: Bretta Bang, M.D.  Ronald Ball Head Wo Contrast  01/31/2013 *  RADIOLOGY REPORT* Clinical Data: Vertigo. Generalized weakness. Nausea.  Vomiting. Diabetic hypertensive hyperlipidemic patient. MRI BRAIN WITHOUT CONTRAST MRA HEAD WITHOUT CONTRAST Technique: Multiplanar, multiecho pulse sequences of the brain and surrounding structures were obtained according to standard protocol without intravenous contrast. Angiographic images of the head were obtained using MRA technique without contrast. Comparison: 01/31/2013 CT. No comparison Ronald. MRI HEAD Findings: No acute infarct. No intracranial hemorrhage. Moderate small vessel disease type changes. Global atrophy. Ventricular prominence may be explained by atrophy although mild hydrocephalus not excluded. Aqueduct is patent. No intracranial mass lesion detected on this unenhanced exam. Cervical medullary junction, pituitary region, pineal region and orbital structures unremarkable. Minimal paranasal sinus mucosal thickening. IMPRESSION: No acute infarct. No intracranial hemorrhage. Moderate small vessel disease type changes. Global atrophy. Ventricular prominence may be explained by atrophy although mild hydrocephalus not excluded. No intracranial mass lesion detected on this unenhanced exam. MRA HEAD Findings: Ectatic internal carotid arteries. Mild narrowing supraclinoid aspect of the left internal carotid artery. Mild narrowing A1 segment of the anterior cerebral artery bilaterally. Mild narrowing distal M1 segment right middle cerebral artery. Right vertebral artery ends in a PICA distribution. Mild narrowing and irregularity of the right vertebral artery and right PICA. Mild irregularity and narrowing of portions of the left vertebral artery. Mild to moderate narrowing proximal basilar artery with moderate to marked narrowing distal basilar artery. Nonvisualization AICAs. Moderate to marked long segment stenosis of the posterior cerebral artery proximal to mid aspect bilaterally. Branch vessel irregularity anterior and posterior circulation. No aneurysm identified. IMPRESSION: Intracranial  atherosclerotic type changes more notable involving the posterior circulation as detailed above. Original Report Authenticated By: Lacy Duverney, M.D.   Medications and allergies reviewed and updated in chart.  Past histories reviewed and updated if relevant as below. Patient Active Problem List   Diagnosis Date Noted  . Dizziness and giddiness 02/01/2013  . Glaucoma   . Chronic back pain   . Vertigo 11/10/2012  . Toe pain, right 03/16/2012  . Skin rash 06/11/2011  . UNSPECIFIED VITAMIN D DEFICIENCY 07/02/2010  . ALLERGIC RHINITIS, SEASONAL 06/05/2010  . Nocturia 05/28/2010  . DIVERTICULOSIS, COLON 07/27/2007  . DIABETES MELLITUS, TYPE II 12/30/2006  . HYPERCHOLESTEROLEMIA/TRIG 216/217 12/30/2006  . HYPERTENSION 12/30/2006  . HIATAL HERNIA 12/30/2006  . BENIGN PROSTATIC HYPERTROPHY 12/30/2006  . DEGENERATIVE JOINT DISEASE 12/30/2006  . ARTHRITIS, MULTIPLE JOINT INJURIES 12/30/2006  . PROSTATITIS, HX OF 12/30/2006   Past Medical History  Diagnosis Date  . BPH (benign prostatic hyperplasia)   . Diverticulosis of colon   . History of barium enema 07/1991    Diverticuli  . History of CT scan of abdomen 08/14/05    with and without right adrenal adenoma-stable  . History of CT scan 08/14/05     pelvis with wnl  . Diabetes mellitus   . Hyperlipidemia   . Glaucoma   . Hypertension   . Chronic back pain    Past Surgical History  Procedure Laterality Date  . Prostate biopsy  07/13/98    benign, cytoscopy wnl  . Shoulder surgery  04/03    right, [place]  . Cataract extraction  04/01    left eye  . Cataract extraction  01/04     right eye   History  Substance Use Topics  . Smoking status: Never Smoker   . Smokeless tobacco: Never Used  . Alcohol Use: No   Family History  Problem Relation Age of Onset  . Cancer Mother     stomach  and colon  . Cancer Brother     possible lung cancer   Allergies  Allergen Reactions  . Atenolol     REACTION: ? Side effect  .  Esomeprazole Magnesium     REACTION: abd swelling  . Ofloxacin     REACTION: itching   Current Outpatient Prescriptions on File Prior to Visit  Medication Sig Dispense Refill  . aspirin EC 81 MG tablet Take 81 mg by mouth daily.      Marland Kitchen latanoprost (XALATAN) 0.005 % ophthalmic solution Place 1 drop into the right eye at bedtime.        Marland Kitchen lisinopril-hydrochlorothiazide (PRINZIDE,ZESTORETIC) 10-12.5 MG per tablet Take 1 tablet by mouth daily.      Marland Kitchen menthol-zinc oxide (GOLD BOND) powder Apply topically 2 (two) times daily as needed.      . Naproxen Sodium (ALEVE) 220 MG CAPS Take by mouth 2 (two) times daily as needed.        . tamsulosin (FLOMAX) 0.4 MG CAPS capsule Take 1 capsule (0.4 mg total) by mouth daily.  30 capsule  6  . timolol (TIMOPTIC) 0.5 % ophthalmic solution Place 1 drop into both eyes 2 (two) times daily.         No current facility-administered medications on file prior to visit.     Review of Systems Per HPI    Objective:   Physical Exam  Nursing note and vitals reviewed. Constitutional: He is oriented to person, place, and time. He appears well-developed and well-nourished. No distress.  HENT:  Mouth/Throat: Oropharynx is clear and moist. No oropharyngeal exudate.  Cardiovascular: Normal rate, regular rhythm, normal heart sounds and intact distal pulses.   No murmur heard. Pulmonary/Chest: Effort normal and breath sounds normal. No respiratory distress. He has no wheezes. He has no rales.  Musculoskeletal: He exhibits no edema.  Neurological: He is alert and oriented to person, place, and time. No cranial nerve deficit or sensory deficit.  CN 2-12 intact  Skin: Skin is warm and dry. No rash noted.  Psychiatric: He has a normal mood and affect.       Assessment & Plan:

## 2013-02-07 NOTE — Assessment & Plan Note (Signed)
Chronic, stable. Continue med. 

## 2013-02-07 NOTE — Patient Instructions (Addendum)
Let's stop flomax. Let's start finasteride (proscar) one pill daily for 1 month to see if any beneficial effect on night time urination. I'm glad you're feeling better. Let me know if any more episodes of vertigo. Good to see you today, call us with questions.

## 2013-02-07 NOTE — Assessment & Plan Note (Signed)
Per report, dix hallpike negative in hospital. Imaging obtained in hospital showing ATH most prevalent posterior circulation, possible etiology of vertigo sxs. Less likely BPV or other peripheral cause, doubt related to SVT episode captured on telemetry. Currently sxs resolved.  Will monitor for now.  Update if sxs persistent. Did not start statin today - given age and possible med side effects.

## 2013-02-07 NOTE — Assessment & Plan Note (Signed)
Did not respond to flomax -and may have caused dizziness sxs. Discussed finasteride - pt agrees to 1-2 mo trial of this med.

## 2013-02-10 ENCOUNTER — Ambulatory Visit: Payer: Medicare Other | Admitting: Family Medicine

## 2013-02-13 ENCOUNTER — Emergency Department (HOSPITAL_COMMUNITY): Payer: Medicare Other

## 2013-02-13 ENCOUNTER — Encounter (HOSPITAL_COMMUNITY): Payer: Self-pay

## 2013-02-13 ENCOUNTER — Emergency Department (HOSPITAL_COMMUNITY)
Admission: EM | Admit: 2013-02-13 | Discharge: 2013-02-13 | Disposition: A | Discharge status: Home / Self Care | Payer: Medicare Other | Attending: Emergency Medicine | Admitting: Emergency Medicine

## 2013-02-13 DIAGNOSIS — Z79899 Other long term (current) drug therapy: Secondary | ICD-10-CM | POA: Insufficient documentation

## 2013-02-13 DIAGNOSIS — I10 Essential (primary) hypertension: Secondary | ICD-10-CM | POA: Insufficient documentation

## 2013-02-13 DIAGNOSIS — H409 Unspecified glaucoma: Secondary | ICD-10-CM | POA: Insufficient documentation

## 2013-02-13 DIAGNOSIS — M1990 Unspecified osteoarthritis, unspecified site: Secondary | ICD-10-CM | POA: Insufficient documentation

## 2013-02-13 DIAGNOSIS — Z8719 Personal history of other diseases of the digestive system: Secondary | ICD-10-CM | POA: Insufficient documentation

## 2013-02-13 DIAGNOSIS — Z7982 Long term (current) use of aspirin: Secondary | ICD-10-CM | POA: Insufficient documentation

## 2013-02-13 DIAGNOSIS — M5416 Radiculopathy, lumbar region: Secondary | ICD-10-CM

## 2013-02-13 DIAGNOSIS — IMO0002 Reserved for concepts with insufficient information to code with codable children: Secondary | ICD-10-CM | POA: Insufficient documentation

## 2013-02-13 DIAGNOSIS — G8929 Other chronic pain: Secondary | ICD-10-CM | POA: Insufficient documentation

## 2013-02-13 DIAGNOSIS — Z87448 Personal history of other diseases of urinary system: Secondary | ICD-10-CM | POA: Insufficient documentation

## 2013-02-13 DIAGNOSIS — E119 Type 2 diabetes mellitus without complications: Secondary | ICD-10-CM | POA: Insufficient documentation

## 2013-02-13 MED ORDER — PREDNISONE 20 MG PO TABS: 20.0000 mg | ORAL_TABLET | Freq: Two times a day (BID) | ORAL | Status: DC

## 2013-02-13 MED ORDER — TRAMADOL HCL 50 MG PO TABS: 50.0000 mg | ORAL_TABLET | Freq: Four times a day (QID) | ORAL | Status: DC | PRN

## 2013-02-13 NOTE — ED Notes (Signed)
Pt ambulated on the hallway with, complain of hip pain with any movement.

## 2013-02-13 NOTE — ED Notes (Signed)
Pt ambulated in the hall without significant assistance.Pt used his walking stick.

## 2013-02-13 NOTE — ED Notes (Signed)
EMS states that pt states that pt has had left hip pain that is sharp for 1 week. Pt denies trauma.  Went to MD 1 week ago and OTC antiinflammatories did not help nor ice, creams, or heat. Pt states the pain does not radiate and hurts more with movement.  PT was here on 01/31/13.

## 2013-02-13 NOTE — ED Provider Notes (Signed)
CSN: [id number]     Arrival date & time 02/13/13  1716 History   First MD Initiated Contact with Patient 02/13/13 1723     Chief Complaint  Patient presents with  . Hip Pain   (Consider location/radiation/quality/duration/timing/severity/associated sxs/prior Treatment) HPI Comments: Ronald Ball is a 79 y.o. male who presents for evaluation of pain in the "left hip". It has been present for one week. It is dull. It is worse with movement. It has not improved with over-the-counter anti-inflammatory medications. He denies trauma. He was evaluated here in 9-14 for dizziness; admitted and discharged. His dizziness has improved. No prior hip problems. No fever, chills, nausea, vomiting, weakness, or dizziness, currently. There are no other known modifying factors.  Patient is a 79 y.o. male presenting with hip pain. The history is provided by the patient.  Hip Pain    Past Medical History  Diagnosis Date  . BPH (benign prostatic hyperplasia)   . Diverticulosis of colon   . History of barium enema 07/1991    Diverticuli  . History of CT scan of abdomen 08/14/05    with and without right adrenal adenoma-stable  . History of CT scan 08/14/05     pelvis with wnl  . Diabetes mellitus   . Hyperlipidemia   . Glaucoma   . Hypertension   . Chronic back pain    Past Surgical History  Procedure Laterality Date  . Prostate biopsy  07/13/98    benign, cytoscopy wnl  . Shoulder surgery  04/03    right, [place]  . Cataract extraction  04/01    left eye  . Cataract extraction  01/04     right eye   Family History  Problem Relation Age of Onset  . Cancer Mother     stomach  and colon  . Cancer Brother     possible lung cancer   History  Substance Use Topics  . Smoking status: Never Smoker   . Smokeless tobacco: Never Used  . Alcohol Use: No    Review of Systems  All other systems reviewed and are negative.    Allergies  Atenolol; Esomeprazole magnesium; and  Ofloxacin  Home Medications   Current Outpatient Rx  Name  Route  Sig  Dispense  Refill  . aspirin EC 81 MG tablet   Oral   Take 81 mg by mouth daily.         Marland Kitchen latanoprost (XALATAN) 0.005 % ophthalmic solution   Right Eye   Place 1 drop into the right eye at bedtime.          Marland Kitchen lisinopril-hydrochlorothiazide (PRINZIDE,ZESTORETIC) 10-12.5 MG per tablet   Oral   Take 1 tablet by mouth daily.         Marland Kitchen menthol-zinc oxide (GOLD BOND) powder   Topical   Apply topically 2 (two) times daily as needed.         . timolol (TIMOPTIC) 0.5 % ophthalmic solution   Both Eyes   Place 1 drop into both eyes 2 (two) times daily.           . finasteride (PROSCAR) 5 MG tablet   Oral   Take 1 tablet (5 mg total) by mouth daily.   30 tablet   3   . predniSONE (DELTASONE) 20 MG tablet   Oral   Take 1 tablet (20 mg total) by mouth 2 (two) times daily.   10 tablet   0   . traMADol (ULTRAM) 50 MG tablet  Oral   Take 1 tablet (50 mg total) by mouth every 6 (six) hours as needed for pain.   20 tablet   0    BP 130/62  Pulse 78  Temp(Src) 97.8 F (36.6 C) (Oral)  Resp 18  SpO2 98% Physical Exam  Nursing note and vitals reviewed. Constitutional: He is oriented to person, place, and time. He appears well-developed and well-nourished.  HENT:  Head: Normocephalic and atraumatic.  Right Ear: External ear normal.  Left Ear: External ear normal.  Eyes: Conjunctivae and EOM are normal. Pupils are equal, round, and reactive to light.  Neck: Normal range of motion and phonation normal. Neck supple.  Cardiovascular: Normal rate.   Pulmonary/Chest: Effort normal. He exhibits no bony tenderness.  Abdominal: Normal appearance.  Musculoskeletal: Normal range of motion.  Mildly tender left pelvic ramus without crepitation. No tenderness to direct palpation of the left hip. There is mild pain with passive range of motion of the left hip. Functional motion of the hips are normal,  bilaterally.  Neurological: He is alert and oriented to person, place, and time. He has normal strength. No cranial nerve deficit or sensory deficit. He exhibits normal muscle tone. Coordination normal.  Skin: Skin is warm, dry and intact.  Psychiatric: He has a normal mood and affect. His behavior is normal. Judgment and thought content normal.    ED Course  Procedures (including critical care time) Labs Review Labs Reviewed - No data to display Imaging Review Dg Hip Complete Left  02/13/2013   CLINICAL DATA:  Left hip pain.  EXAM: LEFT HIP - COMPLETE 2+ VIEW  COMPARISON:  None.  FINDINGS: There is no evidence of hip fracture or dislocation. Mild osteoarthritis present of the hip joint. There is no evidence of bony lesion or destruction.  IMPRESSION: No acute fracture of the left hip identified.   Electronically Signed   By: Irish Lack   On: 02/13/2013 18:46    MDM   1. Lumbar radiculopathy   2. DJD (degenerative joint disease)    Eval. Is c/w lumbar radiculopathy. No evidence for Cauda Equina syndrome, fracture or finding of pain-limited ambulation.  Stable for d/c.   Nursing Notes Reviewed/ Care Coordinated Applicable Imaging Reviewed Interpretation of Laboratory Data incorporated into ED treatment  Plan: Home Medications-Prednisone, Tramadol; Home Treatments- rest, heat; return here if the recommended treatment, does not improve the symptoms; Recommended follow up- PCP or Ortho check up in 1 week.   Flint Melter, MD 02/14/13 [phone]

## 2013-03-03 DIAGNOSIS — R42 Dizziness and giddiness: Secondary | ICD-10-CM

## 2013-03-03 DIAGNOSIS — N400 Enlarged prostate without lower urinary tract symptoms: Secondary | ICD-10-CM

## 2013-03-03 DIAGNOSIS — I10 Essential (primary) hypertension: Secondary | ICD-10-CM

## 2013-06-01 DIAGNOSIS — J90 Pleural effusion, not elsewhere classified: Secondary | ICD-10-CM

## 2013-06-01 HISTORY — DX: Pleural effusion, not elsewhere classified: J90

## 2013-06-02 ENCOUNTER — Other Ambulatory Visit: Payer: Self-pay | Admitting: Family Medicine

## 2013-06-13 ENCOUNTER — Ambulatory Visit (INDEPENDENT_AMBULATORY_CARE_PROVIDER_SITE_OTHER): Payer: Medicare PPO | Admitting: Family Medicine

## 2013-06-13 ENCOUNTER — Encounter: Payer: Self-pay | Admitting: Family Medicine

## 2013-06-13 ENCOUNTER — Ambulatory Visit (INDEPENDENT_AMBULATORY_CARE_PROVIDER_SITE_OTHER)
Admission: RE | Admit: 2013-06-13 | Discharge: 2013-06-13 | Disposition: A | Discharge status: Home / Self Care | Payer: Medicare PPO | Source: Ambulatory Visit | Attending: Family Medicine | Admitting: Family Medicine

## 2013-06-13 VITALS — BP 120/70 | HR 69 | Temp 97.9°F | Ht 70.0 in | Wt 207.2 lb

## 2013-06-13 DIAGNOSIS — N400 Enlarged prostate without lower urinary tract symptoms: Secondary | ICD-10-CM

## 2013-06-13 DIAGNOSIS — R059 Cough, unspecified: Secondary | ICD-10-CM

## 2013-06-13 DIAGNOSIS — R0600 Dyspnea, unspecified: Secondary | ICD-10-CM

## 2013-06-13 DIAGNOSIS — R0989 Other specified symptoms and signs involving the circulatory and respiratory systems: Secondary | ICD-10-CM

## 2013-06-13 DIAGNOSIS — R9389 Abnormal findings on diagnostic imaging of other specified body structures: Secondary | ICD-10-CM

## 2013-06-13 DIAGNOSIS — R05 Cough: Secondary | ICD-10-CM

## 2013-06-13 DIAGNOSIS — R0609 Other forms of dyspnea: Secondary | ICD-10-CM

## 2013-06-13 DIAGNOSIS — R918 Other nonspecific abnormal finding of lung field: Secondary | ICD-10-CM

## 2013-06-13 DIAGNOSIS — R350 Frequency of micturition: Secondary | ICD-10-CM

## 2013-06-13 DIAGNOSIS — J90 Pleural effusion, not elsewhere classified: Secondary | ICD-10-CM

## 2013-06-13 LAB — POCT URINALYSIS DIPSTICK
Bilirubin, UA: NEGATIVE
Glucose, UA: NEGATIVE
Ketones, UA: NEGATIVE
Leukocytes, UA: NEGATIVE
Nitrite, UA: NEGATIVE
Spec Grav, UA: 1.015
UROBILINOGEN UA: 1
pH, UA: 6.5

## 2013-06-13 NOTE — Progress Notes (Signed)
Pre-visit discussion using our clinic review tool. No additional management support is needed unless otherwise documented below in the visit note.  

## 2013-06-13 NOTE — Patient Instructions (Signed)
Use robitussin por mucinex DM for cough.  Rest, and fluids.  We will call you with CXR results.  Elevate feet above heart as able, move around as mucha s you can with exercise.  IF you don't continue to improve in the next 5-7 days follow UP with PCP.

## 2013-06-13 NOTE — Progress Notes (Signed)
   Subjective:    Patient ID: Ronald Ball, male    DOB: 08/05/1913, 80 y.o.   MRN: [id number]  HPI Comments: nonsmoker  Cough This is a new problem. The current episode started 1 to 4 weeks ago. The cough is productive of sputum (clear ). Associated symptoms include a sore throat, shortness of breath and wheezing. Pertinent negatives include no chest pain, chills, ear congestion, ear pain, fever, headaches, hemoptysis, myalgias, nasal congestion, rash, rhinorrhea, sweats or weight loss. He has tried OTC cough suppressant for the symptoms. The treatment provided moderate relief. There is no history of asthma, bronchiectasis, bronchitis, COPD, emphysema, environmental allergies or pneumonia.  Urinary Frequency  This is a chronic problem. The current episode started more than 1 year ago (not any worse lately.). The problem has been waxing and waning. Quality: no burning. Associated symptoms include frequency. Pertinent negatives include no chills, hesitancy, sweats or urgency. Associated symptoms comments: Nocturia, up every 2 hours. occ has to push to get urine out. There is no history of catheterization, kidney stones, recurrent UTIs, a single kidney, urinary stasis or a urological procedure.      Review of Systems  Constitutional: Negative for fever, chills and weight loss.  HENT: Positive for sore throat. Negative for ear pain and rhinorrhea.   Respiratory: Positive for cough, shortness of breath and wheezing. Negative for hemoptysis.   Cardiovascular: Negative for chest pain, palpitations and leg swelling.       In last few days leg in swelling is persistent even when wakes up.  Gastrointestinal: Negative for abdominal pain.  Genitourinary: Positive for frequency. Negative for hesitancy and urgency.  Musculoskeletal: Negative for myalgias.  Skin: Negative for rash.  Allergic/Immunologic: Negative for environmental allergies.  Neurological: Negative for headaches.   He has been  less active been since cold weather.. He usually walks a round a lot.    Objective:   Physical Exam  Constitutional: He is oriented to person, place, and time. Vital signs are normal. He appears well-developed and well-nourished.  Non toxic elderly male in NAD  HENT:  Head: Normocephalic.  Right Ear: Hearing normal.  Left Ear: Hearing normal.  Nose: Nose normal.  Mouth/Throat: Oropharynx is clear and moist and mucous membranes are normal.  Neck: Trachea normal. Carotid bruit is not present. No mass and no thyromegaly present.  Cardiovascular: Normal rate, regular rhythm and normal pulses.  Exam reveals no gallop, no distant heart sounds and no friction rub.   No murmur heard. No peripheral edema  Pulmonary/Chest: Effort normal. No respiratory distress. He has decreased breath sounds. He has no wheezes. He has no rhonchi. He has no rales.  Neurological: He is alert and oriented to person, place, and time.  Skin: Skin is warm, dry and intact. No rash noted.  Psychiatric: He has a normal mood and affect. His speech is normal and behavior is normal. Thought content normal.          Assessment & Plan:

## 2013-06-15 NOTE — Assessment & Plan Note (Signed)
Likely cause of chronic urinary  Issues. Pt not interested in trying another med to treat. Flomax was not helpful per pt in past.  UA clear today.

## 2013-06-15 NOTE — Addendum Note (Signed)
Addended by: Kerby NoraBEDSOLE, AMY E on: 06/15/2013 03:45 PM   Modules accepted: Orders

## 2013-06-15 NOTE — Assessment & Plan Note (Addendum)
Decerased breath sounds through out... Will eval with CXR given age.  Most liekly viral infection, symptomatic treatment at this time. Use robitussin por mucinex DM for cough.  Rest, and fluids.   ADDENDUM: 06/15/2013 CXR shows lared left leural effusion on almost complete atelectasis of left lung.  Will eval further with CT chest ASAP. In process of trying to reach pt at this time.  Spoke with PT and wife in detail. He refuses further imaging or work up at this time. "What happens happens at my age, I didn't plan to live this long anyway" He reports he feels well, just cough with talking and mild SOB, no fever. He also refuses follow up with Dr. Reece AgarG next week unless he is feeling poorly.

## 2013-08-24 ENCOUNTER — Encounter: Payer: Self-pay | Admitting: Family Medicine

## 2013-08-24 ENCOUNTER — Ambulatory Visit (INDEPENDENT_AMBULATORY_CARE_PROVIDER_SITE_OTHER): Payer: Medicare PPO | Admitting: Family Medicine

## 2013-08-24 VITALS — BP 130/78 | HR 72 | Temp 97.6°F | Wt 194.0 lb

## 2013-08-24 DIAGNOSIS — R351 Nocturia: Secondary | ICD-10-CM

## 2013-08-24 DIAGNOSIS — J90 Pleural effusion, not elsewhere classified: Secondary | ICD-10-CM | POA: Insufficient documentation

## 2013-08-24 DIAGNOSIS — IMO0001 Reserved for inherently not codable concepts without codable children: Secondary | ICD-10-CM

## 2013-08-24 DIAGNOSIS — R3915 Urgency of urination: Secondary | ICD-10-CM | POA: Insufficient documentation

## 2013-08-24 DIAGNOSIS — R350 Frequency of micturition: Secondary | ICD-10-CM

## 2013-08-24 LAB — POCT URINALYSIS DIPSTICK
Bilirubin, UA: NEGATIVE
Glucose, UA: NEGATIVE
Ketones, UA: NEGATIVE
Leukocytes, UA: NEGATIVE
Nitrite, UA: NEGATIVE
PH UA: 6.5
Protein, UA: NEGATIVE
RBC UA: NEGATIVE
SPEC GRAV UA: 1.015
UROBILINOGEN UA: NEGATIVE

## 2013-08-24 MED ORDER — SILODOSIN 4 MG PO CAPS: 4.0000 mg | ORAL_CAPSULE | Freq: Every day | ORAL | Status: DC

## 2013-08-24 NOTE — Assessment & Plan Note (Addendum)
Ongoing issue. With h/o BPH in past, but no significant enlargement noted on exam today.   Did not respond to flomax.  Unsure response to finasteride.  Will do trial of rapaflo. Discussed no drinking fluids 2 hours prior to bedtime. Not consistent with prostatitis as UA normal and prostate non tender, not boggy today.  UCx sent. Na normal last check late last year.

## 2013-08-24 NOTE — Assessment & Plan Note (Signed)
Discussed ddx with patient.  Pt denies current sxs from pleural effusion.  Declines further workup at this time given age and I agree. Aware ddx includes infection, cancer.

## 2013-08-24 NOTE — Patient Instructions (Signed)
Urine is looking ok. Let's try rapaflo for night time urination.  Take one every morning. Call me in 2 weeks with an update on how you're doing.

## 2013-08-24 NOTE — Progress Notes (Signed)
Pre visit review using our clinic review tool, if applicable. No additional management support is needed unless otherwise documented below in the visit note. 

## 2013-08-24 NOTE — Progress Notes (Signed)
BP 130/78  Pulse 72  Temp(Src) 97.6 F (36.4 C) (Oral)  Wt 194 lb (87.998 kg)  SpO2 96%   CC: increased nocturia  Subjective:    Patient ID: Ronald Ball, male    DOB: 10/01/1913, 80 y.o.   MRN: [id number]  HPI: Ronald Ball is a 80 y.o. male presenting on 08/24/2013 for possible kidney infection   Just celebrated 77th wedding anniversary!  Presents with wife today.  Feeling increased need to urinat at night time causing trouble sleeping.  Tends to happen every other night.  At times every hour.  + urgency.  Urine smells worse than normal.  + lower back pain (see below).  Doesn't drink fluids at bedtime.  Denies dysuria.  No hematuria.  No flank pain, abd pain, fevers/chills, nausea/vomiting. Saw Dr. August Saucerean ortho recently with cortisone shots for lower back pain.  Prior on flomax with no improvement in sxs.  Lab Results  Component Value Date   PSA 0.36 07/02/2010   PSA 0.41 06/27/2009   PSA 0.53 07/25/2007    No planned eye surgeries.  Did have cataracts removed bilaterally Recently found to have large L pleural effusion, declined further evaluation.  Denies chest pain or dyspnea or cough. EXAM:  CHEST 2 VIEW  COMPARISON: No previous chest x-rays for comparison. A CT scan of  the abdomen dated August 14, 2005 is reviewed.  FINDINGS:  There is new volume loss on the left suggesting a very large pleural  effusion. Only a small amount of aerated lung in the left upper  hemithorax is visible. There is mild shift of the mediastinum  towards the right. The right lung is well-expanded. There is likely  fluid in the minor fissure. The right heart border appears normal  though shifted toward the right. The observed portions of the bony  thorax exhibit no acute abnormalities.  IMPRESSION:  There is near total atelectasis of the left lung likely secondary to  a large pleural effusion. A small amount of pleural fluid in the  minor fissure on the right is suspected. There is  mediastinal shift  towards the right as well. CT scanning of the chest is recommended  now.    Relevant past medical, surgical, family and social history reviewed and updated as indicated.  Allergies and medications reviewed and updated. Current Outpatient Prescriptions on File Prior to Visit  Medication Sig  . aspirin EC 81 MG tablet Take 81 mg by mouth daily.  Marland Kitchen. latanoprost (XALATAN) 0.005 % ophthalmic solution Place 1 drop into the right eye at bedtime.   Marland Kitchen. lisinopril-hydrochlorothiazide (PRINZIDE,ZESTORETIC) 10-12.5 MG per tablet TAKE ONE TABLET BY MOUTH EVERY DAY  . timolol (TIMOPTIC) 0.5 % ophthalmic solution Place 1 drop into both eyes 2 (two) times daily.     No current facility-administered medications on file prior to visit.    Review of Systems Per HPI unless specifically indicated above    Objective:    BP 130/78  Pulse 72  Temp(Src) 97.6 F (36.4 C) (Oral)  Wt 194 lb (87.998 kg)  SpO2 96%  Physical Exam  Nursing note and vitals reviewed. Constitutional: He appears well-developed and well-nourished. No distress.  HENT:  Head: Normocephalic and atraumatic.  Mouth/Throat: Oropharynx is clear and moist. No oropharyngeal exudate.  Eyes: Conjunctivae and EOM are normal. Pupils are equal, round, and reactive to light. No scleral icterus.  Cardiovascular: Normal rate, regular rhythm, normal heart sounds and intact distal pulses.   No murmur heard. Pulmonary/Chest: No  respiratory distress. He has decreased breath sounds in the left upper field, the left middle field and the left lower field. He has no wheezes. He has no rhonchi. He has rales.  Complete decrease of left lung fields  Abdominal: Soft. Bowel sounds are normal. He exhibits no distension and no mass. There is no tenderness. There is no rebound and no guarding.  Genitourinary: Rectum normal and prostate normal. Rectal exam shows no external hemorrhoid, no internal hemorrhoid, no fissure, no mass, no tenderness  and anal tone normal. Prostate is not enlarged (10-15gm) and not tender.  Musculoskeletal: He exhibits no edema.   Results for orders placed in visit on 08/24/13  POCT URINALYSIS DIPSTICK      Result Value Ref Range   Color, UA Yellow     Clarity, UA Clear     Glucose, UA Neg     Bilirubin, UA Neg     Ketones, UA Neg     Spec Grav, UA 1.015     Blood, UA Neg     pH, UA 6.5     Protein, UA Neg     Urobilinogen, UA negative     Nitrite, UA Neg     Leukocytes, UA Negative        Assessment & Plan:   Problem List Items Addressed This Visit   Nocturia - Primary     Ongoing issue. With h/o BPH in past, but no significant enlargement noted on exam today.   Did not respond to flomax.  Unsure response to finasteride.  Will do trial of rapaflo. Discussed no drinking fluids 2 hours prior to bedtime. Not consistent with prostatitis as UA normal and prostate non tender, not boggy today.  UCx sent. Na normal last check late last year.    Relevant Orders      Urine culture   Pleural effusion, left     Discussed ddx with patient.  Pt denies current sxs from pleural effusion.  Declines further workup at this time given age and I agree. Aware ddx includes infection, cancer.     Other Visit Diagnoses   Frequency        Relevant Orders       POCT Urinalysis Dipstick (Completed)        Follow up plan: Return if symptoms worsen or fail to improve.

## 2013-08-25 ENCOUNTER — Other Ambulatory Visit: Payer: Self-pay

## 2013-08-25 LAB — URINE CULTURE
Colony Count: NO GROWTH
ORGANISM ID, BACTERIA: NO GROWTH

## 2013-08-25 MED ORDER — SILODOSIN 4 MG PO CAPS: 4.0000 mg | ORAL_CAPSULE | Freq: Every day | ORAL | Status: DC

## 2013-08-25 NOTE — Telephone Encounter (Signed)
Mrs Sedonia SmallRoberson request Rapaflo to [facility]; advised refill done and I called [facility] spoke with Raquel and cancelled Rapaflo.

## 2013-10-09 ENCOUNTER — Encounter: Payer: Self-pay | Admitting: Family Medicine

## 2013-10-09 ENCOUNTER — Ambulatory Visit (INDEPENDENT_AMBULATORY_CARE_PROVIDER_SITE_OTHER): Payer: Medicare PPO | Admitting: Family Medicine

## 2013-10-09 ENCOUNTER — Telehealth: Payer: Self-pay | Admitting: Family Medicine

## 2013-10-09 VITALS — BP 130/84 | HR 84 | Temp 97.9°F | Wt 206.0 lb

## 2013-10-09 DIAGNOSIS — J90 Pleural effusion, not elsewhere classified: Secondary | ICD-10-CM

## 2013-10-09 DIAGNOSIS — R21 Rash and other nonspecific skin eruption: Secondary | ICD-10-CM

## 2013-10-09 DIAGNOSIS — R32 Unspecified urinary incontinence: Secondary | ICD-10-CM

## 2013-10-09 DIAGNOSIS — R609 Edema, unspecified: Secondary | ICD-10-CM

## 2013-10-09 DIAGNOSIS — R3915 Urgency of urination: Secondary | ICD-10-CM

## 2013-10-09 DIAGNOSIS — R600 Localized edema: Secondary | ICD-10-CM

## 2013-10-09 DIAGNOSIS — I10 Essential (primary) hypertension: Secondary | ICD-10-CM

## 2013-10-09 LAB — POCT URINALYSIS DIPSTICK
BILIRUBIN UA: NEGATIVE
Blood, UA: NEGATIVE
Glucose, UA: NEGATIVE
KETONES UA: NEGATIVE
Leukocytes, UA: NEGATIVE
Nitrite, UA: NEGATIVE
PH UA: 7
Protein, UA: NEGATIVE
SPEC GRAV UA: 1.015
Urobilinogen, UA: 1

## 2013-10-09 LAB — CBC WITH DIFFERENTIAL/PLATELET
BASOS ABS: 0 10*3/uL (ref 0.0–0.1)
Basophils Relative: 0.3 % (ref 0.0–3.0)
Eosinophils Absolute: 0.2 10*3/uL (ref 0.0–0.7)
Eosinophils Relative: 1.6 % (ref 0.0–5.0)
HCT: 43.7 % (ref 39.0–52.0)
Hemoglobin: 14 g/dL (ref 13.0–17.0)
LYMPHS ABS: 2.2 10*3/uL (ref 0.7–4.0)
Lymphocytes Relative: 19.7 % (ref 12.0–46.0)
MCHC: 32.1 g/dL (ref 30.0–36.0)
MCV: 91.3 fl (ref 78.0–100.0)
Monocytes Absolute: 1.3 10*3/uL — ABNORMAL HIGH (ref 0.1–1.0)
Monocytes Relative: 11.1 % (ref 3.0–12.0)
NEUTROS PCT: 67.3 % (ref 43.0–77.0)
Neutro Abs: 7.6 10*3/uL (ref 1.4–7.7)
PLATELETS: 205 10*3/uL (ref 150.0–400.0)
RBC: 4.78 Mil/uL (ref 4.22–5.81)
RDW: 14.6 % (ref 11.5–15.5)
WBC: 11.4 10*3/uL — ABNORMAL HIGH (ref 4.0–10.5)

## 2013-10-09 LAB — BRAIN NATRIURETIC PEPTIDE: PRO B NATRI PEPTIDE: 39 pg/mL (ref 0.0–100.0)

## 2013-10-09 LAB — COMPREHENSIVE METABOLIC PANEL
ALBUMIN: 4.1 g/dL (ref 3.5–5.2)
ALT: 13 U/L (ref 0–53)
AST: 16 U/L (ref 0–37)
Alkaline Phosphatase: 86 U/L (ref 39–117)
BUN: 19 mg/dL (ref 6–23)
CALCIUM: 9.4 mg/dL (ref 8.4–10.5)
CHLORIDE: 96 meq/L (ref 96–112)
CO2: 33 meq/L — AB (ref 19–32)
Creatinine, Ser: 1.2 mg/dL (ref 0.4–1.5)
GFR: 61.84 mL/min (ref >60.00)
Glucose, Bld: 135 mg/dL — ABNORMAL HIGH (ref 70–99)
Potassium: 4.2 mEq/L (ref 3.5–5.1)
SODIUM: 136 meq/L (ref 135–145)
TOTAL PROTEIN: 7 g/dL (ref 6.0–8.3)
Total Bilirubin: 0.9 mg/dL (ref 0.2–1.2)

## 2013-10-09 LAB — SEDIMENTATION RATE: SED RATE: 11 mm/h (ref 0–22)

## 2013-10-09 MED ORDER — FINASTERIDE 5 MG PO TABS: 5.0000 mg | ORAL_TABLET | Freq: Every day | ORAL | Status: DC

## 2013-10-09 NOTE — Assessment & Plan Note (Signed)
Reviewed with patient and son this diagnosis as well as ddx of this.  Pt continues to decline further evaluation including CT and thoracentesis.

## 2013-10-09 NOTE — Progress Notes (Signed)
Pre visit review using our clinic review tool, if applicable. No additional management support is needed unless otherwise documented below in the visit note. 

## 2013-10-09 NOTE — Patient Instructions (Signed)
Urine looked ok today. Blood work today. Check with pharmacist about cheaper alternative to rapaflo (flomax didn't help previously). Start finasteride 1 pill daily for next 3 months (trial) to see if any improvement. Keep legs elevated, avoid salt/sodium, drink good water.

## 2013-10-09 NOTE — Assessment & Plan Note (Signed)
Given new finding of pedal edema along with prior known L pleural effusion, will start with blood work today - pt and son agree. Will check BNP, CMP, CBC, LDH and ESR. UA today normal - no macroproteinuria noted. Encouraged elevation of legs, avoidance of salt, and increased water intake.

## 2013-10-09 NOTE — Telephone Encounter (Signed)
Relevant patient education mailed to patient.  

## 2013-10-09 NOTE — Assessment & Plan Note (Signed)
Chronic, stable. Continue combo pill. 

## 2013-10-09 NOTE — Progress Notes (Signed)
 BP 130/84  Pulse 84  Temp(Src) 97.9 F (36.6 C) (Oral)  Wt 206 lb (93.441 kg)  SpO2 99%   CC: "I think i've got a kidney infection"  Subjective:    Patient ID: Ronald Ball, male    DOB: 03/14/1914, 80 y.o.   MRN: [id number]  HPI: Jospeh W Goostree is a 80 y.o. male presenting on 10/09/2013 for Urinary Incontinence and Leg Swelling   Presents with son Dennis.  Longstanding h/o urinary trouble - urgency, nocturia.  UA normal on prior checks, DRE normal, treated with flomax then rapaflo.  Also tried and failed finasteride.  rapaflo helped with frequency but worsened urgency - now having urinary accidents.  Taking rapaflo QOD.  Doesn't wear depens.  Yesterday also with lower back pain. Denies stress incontinence sxs.  + urge incontinence sxs.   Yesterday legs significantly swelling, today some better but still swollen.  Also yesterday noticed red spots on legs. No fevers/chills, abd pain. H/o benign prostate biopsy 2000.  Recently found to have large L pleural effusion, declined further evaluation. Denies chest pain.  Denies dyspnea.  Occasional cough.  Relevant past medical, surgical, family and social history reviewed and updated as indicated.  Allergies and medications reviewed and updated. Current Outpatient Prescriptions on File Prior to Visit  Medication Sig  . aspirin EC 81 MG tablet Take 81 mg by mouth daily.  . latanoprost (XALATAN) 0.005 % ophthalmic solution Place 1 drop into the right eye at bedtime.   . lisinopril-hydrochlorothiazide (PRINZIDE,ZESTORETIC) 10-12.5 MG per tablet TAKE ONE TABLET BY MOUTH EVERY DAY  . Podiatric Products (GOLD BOND FOOT) CREA Apply topically 2 (two) times daily as needed.  . silodosin (RAPAFLO) 4 MG CAPS capsule Take 1 capsule (4 mg total) by mouth daily with breakfast.  . timolol (TIMOPTIC) 0.5 % ophthalmic solution Place 1 drop into both eyes 2 (two) times daily.     No current facility-administered medications on file prior to  visit.    Review of Systems Per HPI unless specifically indicated above    Objective:    BP 130/84  Pulse 84  Temp(Src) 97.9 F (36.6 C) (Oral)  Wt 206 lb (93.441 kg)  SpO2 99%  Physical Exam  Nursing note and vitals reviewed. Constitutional: He appears well-developed and well-nourished. No distress.  Nontoxic, able to get on exam table slowly but without assistance   HENT:  Mouth/Throat: Oropharynx is clear and moist. No oropharyngeal exudate.  Cardiovascular: Normal rate, regular rhythm, normal heart sounds and intact distal pulses.   No murmur heard. Pulmonary/Chest: Effort normal. No respiratory distress. He has decreased breath sounds in the left upper field, the left middle field and the left lower field. He has no wheezes. He has rhonchi (scattered). He has no rales.  Decreased L lung field  Abdominal: Soft. Normal appearance and bowel sounds are normal. He exhibits no distension and no mass. There is no hepatosplenomegaly. There is no tenderness. There is no rigidity, no rebound, no guarding, no CVA tenderness and negative Murphy's sign.  Gallbladder palpated with deep inspiration  Musculoskeletal: He exhibits edema (1+ bilat pitting pedal edema).  Skin: Skin is warm and dry. Rash noted.  Erythematous nontender nonpruritic nonblanching maculopapular rash on bilateral lower legs to sock line  Psychiatric: He has a normal mood and affect.   Results for orders placed in visit on 10/09/13  POCT URINALYSIS DIPSTICK      Result Value Ref Range   Color, UA Yellow       Clarity, UA Clear     Glucose, UA Negative     Bilirubin, UA Negative     Ketones, UA Negative     Spec Grav, UA 1.015     Blood, UA Negative     pH, UA 7.0     Protein, UA Negative     Urobilinogen, UA 1.0     Nitrite, UA Negative     Leukocytes, UA Negative        Assessment & Plan:   Problem List Items Addressed This Visit   HYPERTENSION     Chronic, stable. Continue combo pill.    Urinary  urgency     Longstanding issue.  Anticipate combination of BPH with LUTS and possible overactive bladder. Continue rapaflo QOD.  Did not respond to flomax.  Will restart finasteride - recommended 3 mo trial. I did also ask him/son to check with pharmacy for more affordable alpha blocker options - states rapaflo is >100$ per month.    Pleural effusion, left     Reviewed with patient and son this diagnosis as well as ddx of this.  Pt continues to decline further evaluation including CT and thoracentesis.    Relevant Orders      Comprehensive metabolic panel      CBC with Differential      Lactate Dehydrogenase      Sedimentation rate   Pedal edema - Primary     Given new finding of pedal edema along with prior known L pleural effusion, will start with blood work today - pt and son agree. Will check BNP, CMP, CBC, LDH and ESR. UA today normal - no macroproteinuria noted. Encouraged elevation of legs, avoidance of salt, and increased water intake.    Relevant Orders      Comprehensive metabolic panel      CBC with Differential      Brain natriuretic peptide      Lactate Dehydrogenase      Sedimentation rate   Skin rash     Unclear etiology - ?related to recent rapid swelling from pedal edema.  If not better may need to do trial off rapaflo (?related)     Other Visit Diagnoses   Incontinence of urine        Relevant Medications       PROSCAR 5 MG PO TABS    Other Relevant Orders       POCT Urinalysis Dipstick (Completed)        Follow up plan: Return if symptoms worsen or fail to improve.   

## 2013-10-09 NOTE — Assessment & Plan Note (Signed)
Unclear etiology - ?related to recent rapid swelling from pedal edema.  If not better may need to do trial off rapaflo (?related)

## 2013-10-09 NOTE — Assessment & Plan Note (Signed)
Longstanding issue.  Anticipate combination of BPH with LUTS and possible overactive bladder. Continue rapaflo QOD.  Did not respond to flomax.  Will restart finasteride - recommended 3 mo trial. I did also ask him/son to check with pharmacy for more affordable alpha blocker options - states rapaflo is >100$ per month.

## 2013-10-10 LAB — LACTATE DEHYDROGENASE: LDH: 159 U/L (ref 94–250)

## 2013-10-24 ENCOUNTER — Encounter: Payer: Self-pay | Admitting: Family Medicine

## 2013-10-24 ENCOUNTER — Ambulatory Visit (INDEPENDENT_AMBULATORY_CARE_PROVIDER_SITE_OTHER): Payer: Medicare PPO | Admitting: Family Medicine

## 2013-10-24 VITALS — BP 124/68 | HR 74 | Temp 97.9°F | Ht 70.0 in | Wt 204.0 lb

## 2013-10-24 DIAGNOSIS — J90 Pleural effusion, not elsewhere classified: Secondary | ICD-10-CM

## 2013-10-24 DIAGNOSIS — R600 Localized edema: Secondary | ICD-10-CM

## 2013-10-24 DIAGNOSIS — R609 Edema, unspecified: Secondary | ICD-10-CM

## 2013-10-24 NOTE — Assessment & Plan Note (Signed)
Not interested in further eval

## 2013-10-24 NOTE — Progress Notes (Signed)
   Subjective:    Patient ID: Ronald Ball, male    DOB: [birth year], 80 y.o.   MRN: [id number]  HPI  80 year old male pt of Dr. Leo Grosser with history of peripheral edema, pleural effusion.  He saw Dr. Darnell Level on 5/11 for pedal edmea x several weeks, left pleural effusion and urinary urgency.  Recently found to have large L pleural effusion, declined further evaluation including thoracentesis and CT. Denies chest pain. Denies dyspnea. Occasional cough. BNP, CMP, cbc, LDH, ESR obtained and were nml (Cr 1.2 ans AST, ALT nml), BNP 39, sed rate and LDH nml as well. Encouraged elevation of legs, avoidance of salt, and increased water intake.  He is on  Losartan HCTZ for BP  BP Readings from Last 3 Encounters:  10/24/13 124/68  10/09/13 130/84  08/24/13 130/78   Wt Readings from Last 3 Encounters:  10/24/13 204 lb (92.534 kg)  10/09/13 206 lb (93.441 kg)  08/24/13 194 lb (87.998 kg)      Today he returns to clinic with worsening edema in B legs. He has been elevating above his legs. His legs are now red B up to mid calf.  No pain in legs, no numbness. No new chest pain and no SOB. No fever.      Review of Systems  Constitutional: Negative for fever and fatigue.  HENT: Negative for facial swelling.   Eyes: Negative for pain.  Respiratory: Negative for shortness of breath.   Cardiovascular: Negative for chest pain.  Gastrointestinal: Negative for abdominal pain.       Objective:   Physical Exam  Constitutional: Vital signs are normal. He appears well-developed and well-nourished.  HENT:  Head: Normocephalic.  Right Ear: Hearing normal.  Left Ear: Hearing normal.  Nose: Nose normal.  Mouth/Throat: Oropharynx is clear and moist and mucous membranes are normal.  Neck: Trachea normal. Carotid bruit is not present. No mass and no thyromegaly present.  Cardiovascular: Normal rate, regular rhythm and normal pulses.  Exam reveals no gallop, no distant heart sounds and no friction  rub.   No murmur heard.  2 plus pitting edema. B erythema in legs.  Pulmonary/Chest: Effort normal. No respiratory distress. He has decreased breath sounds in the left middle field and the left lower field. He has no rhonchi. He has no rales.  Skin: Skin is warm, dry and intact. No rash noted.  Psychiatric: He has a normal mood and affect. His speech is normal and behavior is normal. Thought content normal.          Assessment & Plan:

## 2013-10-24 NOTE — Patient Instructions (Signed)
Will treat with elevation of legs and compression hose.  Follow up with PCP in next 1-2 weeks.  Call sooner if shortness of breath or chest pain or swelling worsening.

## 2013-10-24 NOTE — Progress Notes (Signed)
Pre visit review using our clinic review tool, if applicable. No additional management support is needed unless otherwise documented below in the visit note. 

## 2013-10-24 NOTE — Assessment & Plan Note (Addendum)
Recent lab work up [employer].  Pt without pain, no large weight gain or BP elevation  Given age and other issues with urinary urgency... Will hold off on change from HCTZ to furosemide. No clear infection B, rash likely due to swelling.  Will treat with elevation of legs and compression hose.  Follow up with PCP in next 1-2 weeks.

## 2013-11-14 ENCOUNTER — Encounter: Payer: Self-pay | Admitting: Family Medicine

## 2013-11-14 ENCOUNTER — Ambulatory Visit (INDEPENDENT_AMBULATORY_CARE_PROVIDER_SITE_OTHER): Payer: Medicare PPO | Admitting: Family Medicine

## 2013-11-14 VITALS — BP 130/78 | HR 75 | Temp 97.7°F | Wt 204.5 lb

## 2013-11-14 DIAGNOSIS — R609 Edema, unspecified: Secondary | ICD-10-CM

## 2013-11-14 DIAGNOSIS — R600 Localized edema: Secondary | ICD-10-CM

## 2013-11-14 MED ORDER — POTASSIUM CHLORIDE CRYS ER 20 MEQ PO TBCR: 20.0000 meq | EXTENDED_RELEASE_TABLET | Freq: Every day | ORAL | Status: DC

## 2013-11-14 MED ORDER — FUROSEMIDE 20 MG PO TABS: 20.0000 mg | ORAL_TABLET | Freq: Every day | ORAL | Status: DC

## 2013-11-14 NOTE — Progress Notes (Signed)
Pre visit review using our clinic review tool, if applicable. No additional management support is needed unless otherwise documented below in the visit note. 

## 2013-11-14 NOTE — Patient Instructions (Addendum)
Stop proscar. Start furosemide 20 mg daily and potassium daily until next appt. Get compression hose. Follow up with Dr. Reece AgarG on Thursday.

## 2013-11-14 NOTE — Progress Notes (Signed)
HPI   80 year old male pt of Dr. Leo Grosser with history of peripheral edema, pleural effusion.   He saw Dr. Darnell Level on 5/11 for pedal edmea x several weeks, left pleural effusion and urinary urgency.  Recently found to have large L pleural effusion, declined further evaluation including thoracentesis and CT. Denies chest pain. Denies dyspnea. Occasional cough. BNP, CMP, cbc, LDH, ESR obtained and were nml (Cr 1.2 ans AST, ALT nml), BNP 39, sed rate and LDH nml as well. Encouraged elevation of legs, avoidance of salt, and increased water intake.  He is on  Losartan HCTZ for BP    Seen on 5/26 for worsening edema in B legs. He has been elevating above his legs.   No pain in legs, no numbness. No new chest pain and no SOB. No fever.  Today he comes in for follow up. He states that he think proscar is causing his swelling. He had improvement with nocturia with this.  He stopped proscar a week ago. No pain in legs, no numbness. No new chest pain and  now mild SOB. No fever.  He has been elevating legs.  he never got compression hose yet.  He is very bothered with swelling.  BP Readings from Last 3 Encounters:  11/14/13 130/78  10/24/13 124/68  10/09/13 130/84                   Review of Systems  Constitutional: Negative for fever and fatigue.  HENT: Negative for facial swelling.   Eyes: Negative for pain.  Respiratory: Negative for shortness of breath.   Cardiovascular: Negative for chest pain.  Gastrointestinal: Negative for abdominal pain.          Objective:     Physical Exam  Constitutional: Vital signs are normal. He appears well-developed and well-nourished.  HENT:   Head: Normocephalic.  Right Ear: Hearing normal.  Left Ear: Hearing normal.   Nose: Nose normal.   Mouth/Throat: Oropharynx is clear and moist and mucous membranes are normal.  Neck: Trachea normal. Carotid bruit is not present. No mass and no thyromegaly present.  Cardiovascular: Normal  rate, regular rhythm and normal pulses.  Exam reveals no gallop, no distant heart sounds and no friction rub.    No murmur heard. 2 plus pitting edema. B erythema in legs.  Pulmonary/Chest: Effort normal. No respiratory distress. He has decreased breath sounds in the left middle field and the left lower field. He has no rhonchi. He has no rales.  Skin: Skin is warm, dry and intact. No rash noted.  Psychiatric: He has a normal mood and affect. His speech is normal and behavior is normal. Thought content normal.

## 2013-11-16 ENCOUNTER — Ambulatory Visit (INDEPENDENT_AMBULATORY_CARE_PROVIDER_SITE_OTHER): Payer: Medicare PPO | Admitting: Family Medicine

## 2013-11-16 ENCOUNTER — Encounter: Payer: Self-pay | Admitting: Family Medicine

## 2013-11-16 VITALS — BP 128/68 | HR 86 | Temp 98.1°F | Wt 201.5 lb

## 2013-11-16 DIAGNOSIS — R609 Edema, unspecified: Secondary | ICD-10-CM

## 2013-11-16 DIAGNOSIS — R600 Localized edema: Secondary | ICD-10-CM

## 2013-11-16 DIAGNOSIS — N400 Enlarged prostate without lower urinary tract symptoms: Secondary | ICD-10-CM

## 2013-11-16 DIAGNOSIS — J90 Pleural effusion, not elsewhere classified: Secondary | ICD-10-CM

## 2013-11-16 NOTE — Progress Notes (Signed)
BP 128/68  Pulse 86  Temp(Src) 98.1 F (36.7 C) (Oral)  Wt 201 lb 8 oz (91.4 kg)  SpO2 92%   CC: 2d f/u  Subjective:    Patient ID: Ronald Ball Postema, male    DOB: 08/01/1913, 80 y.o.   MRN: [id number]  HPI: Ronald Ball Deerman is a 80 y.o. male presenting on 11/16/2013 for Follow-up   Mr. Sedonia SmallRoberson presents today with wife. See recent notes for details. Seen by Dr. Ermalene SearingBedsole 2d ago with continued pedal edema and papular rash, started on lasix with potassium. This has helped with leg swelling. ?proscar related - stopped this med after 15 days.   Also off siladosin - states it caused urinary retention.  Rash slowly improving with improving swelling. Never filled compression stocking. Has not had a chance to pick up. Unsure will be able to tolerate this as just tight socks cause discomfort.  Has been sitting on porch outside. Drinking plenty of water. Lab Results  Component Value Date   CREATININE 1.2 10/09/2013    Asks about form to have mail delivered to their house rather than walking all the way out to the mailbox. Dr. B prepared this for him earlier this week.  Wt Readings from Last 3 Encounters:  11/16/13 201 lb 8 oz (91.4 kg)  11/14/13 204 lb 8 oz (92.761 kg)  10/24/13 204 lb (92.534 kg)    Relevant past medical, surgical, family and social history reviewed and updated as indicated.  Allergies and medications reviewed and updated. Current Outpatient Prescriptions on File Prior to Visit  Medication Sig  . aspirin EC 81 MG tablet Take 81 mg by mouth daily.  . furosemide (LASIX) 20 MG tablet Take 1 tablet (20 mg total) by mouth daily.  Marland Kitchen. latanoprost (XALATAN) 0.005 % ophthalmic solution Place 1 drop into the right eye at bedtime.   Marland Kitchen. lisinopril-hydrochlorothiazide (PRINZIDE,ZESTORETIC) 10-12.5 MG per tablet TAKE ONE TABLET BY MOUTH EVERY DAY  . Podiatric Products (GOLD BOND FOOT) CREA Apply topically 2 (two) times daily as needed.  . potassium chloride SA  (K-DUR,KLOR-CON) 20 MEQ tablet Take 1 tablet (20 mEq total) by mouth daily.  . timolol (TIMOPTIC) 0.5 % ophthalmic solution Place 1 drop into both eyes 2 (two) times daily.     No current facility-administered medications on file prior to visit.    Review of Systems Per HPI unless specifically indicated above    Objective:    BP 128/68  Pulse 86  Temp(Src) 98.1 F (36.7 C) (Oral)  Wt 201 lb 8 oz (91.4 kg)  SpO2 92%  Physical Exam  Nursing note and vitals reviewed. Constitutional: He appears well-developed and well-nourished. No distress.  Cardiovascular: Normal rate, regular rhythm, normal heart sounds and intact distal pulses.   No murmur heard. Pulmonary/Chest: Effort normal. No respiratory distress. He has decreased breath sounds (throughout entire L lung fields). He has no wheezes. He has no rales.  Musculoskeletal: He exhibits edema (1+ pedal edema).  With erythema of BLE and resolving papular rash, no calor or tenderness   Results for orders placed in visit on 10/09/13  COMPREHENSIVE METABOLIC PANEL      Result Value Ref Range   Sodium 136  135 - 145 mEq/L   Potassium 4.2  3.5 - 5.1 mEq/L   Chloride 96  96 - 112 mEq/L   CO2 33 (*) 19 - 32 mEq/L   Glucose, Bld 135 (*) 70 - 99 mg/dL   BUN 19  6 - 23 mg/dL  Creatinine, Ser 1.2  0.4 - 1.5 mg/dL   Total Bilirubin 0.9  0.2 - 1.2 mg/dL   Alkaline Phosphatase 86  39 - 117 U/L   AST 16  0 - 37 U/L   ALT 13  0 - 53 U/L   Total Protein 7.0  6.0 - 8.3 g/dL   Albumin 4.1  3.5 - 5.2 g/dL   Calcium 9.4  8.4 - 16.110.5 mg/dL   GFR 09.6061.84  >45.40>60.00 mL/min  CBC WITH DIFFERENTIAL      Result Value Ref Range   WBC 11.4 (*) 4.0 - 10.5 K/uL   RBC 4.78  4.22 - 5.81 Mil/uL   Hemoglobin 14.0  13.0 - 17.0 g/dL   HCT 98.143.7  19.139.0 - 47.852.0 %   MCV 91.3  78.0 - 100.0 fl   MCHC 32.1  30.0 - 36.0 g/dL   RDW 29.514.6  62.111.5 - 30.815.5 %   Platelets 205.0  150.0 - 400.0 K/uL   Neutrophils Relative % 67.3  43.0 - 77.0 %   Lymphocytes Relative 19.7  12.0 -  46.0 %   Monocytes Relative 11.1  3.0 - 12.0 %   Eosinophils Relative 1.6  0.0 - 5.0 %   Basophils Relative 0.3  0.0 - 3.0 %   Neutro Abs 7.6  1.4 - 7.7 K/uL   Lymphs Abs 2.2  0.7 - 4.0 K/uL   Monocytes Absolute 1.3 (*) 0.1 - 1.0 K/uL   Eosinophils Absolute 0.2  0.0 - 0.7 K/uL   Basophils Absolute 0.0  0.0 - 0.1 K/uL  BRAIN NATRIURETIC PEPTIDE      Result Value Ref Range   Pro B Natriuretic peptide (BNP) 39.0  0.0 - 100.0 pg/mL  LACTATE DEHYDROGENASE      Result Value Ref Range   LDH 159  94 - 250 U/L  SEDIMENTATION RATE      Result Value Ref Range   Sed Rate 11  0 - 22 mm/hr  POCT URINALYSIS DIPSTICK      Result Value Ref Range   Color, UA Yellow     Clarity, UA Clear     Glucose, UA Negative     Bilirubin, UA Negative     Ketones, UA Negative     Spec Grav, UA 1.015     Blood, UA Negative     pH, UA 7.0     Protein, UA Negative     Urobilinogen, UA 1.0     Nitrite, UA Negative     Leukocytes, UA Negative        Assessment & Plan:   Problem List Items Addressed This Visit   Pleural effusion, left     Remains asxs - denies dyspnea, chest pain, or cough. Again discussed option of diagnostic and therapeutic thoracentesis, will defer for now.    Pedal edema - Primary     Seems to be improving with lasix use. Discussed continued use of 10-20mg  lasix daily for next 3-5 days, then may space out to QD PRN leg swelling.  Discussed use of potassium 10-3620mEq when takes lasix. Ok to hold compression stockings for now.        Follow up plan: Return in about 1 month (around 12/16/2013), or if symptoms worsen or fail to improve, for follow up visit.

## 2013-11-16 NOTE — Progress Notes (Signed)
Pre visit review using our clinic review tool, if applicable. No additional management support is needed unless otherwise documented below in the visit note. 

## 2013-11-16 NOTE — Patient Instructions (Addendum)
Let's continue lasix 1/2-1 tablet daily as needed. Continue potassium whenever you take lasix tablet. If you're getting short winded or worsening cough, let us know - we could evaluate the fluid in the left lung. Good to see you today.

## 2013-11-17 NOTE — Assessment & Plan Note (Signed)
Seems to be improving with lasix use. Discussed continued use of 10-20mg  lasix daily for next 3-5 days, then may space out to QD PRN leg swelling.  Discussed use of potassium 10-6720mEq when takes lasix. Ok to hold compression stockings for now.

## 2013-11-17 NOTE — Assessment & Plan Note (Signed)
Off a-blocker, off 5-a-reductase inhibitor - stay off meds for now.

## 2013-11-17 NOTE — Assessment & Plan Note (Signed)
Remains asxs - denies dyspnea, chest pain, or cough. Again discussed option of diagnostic and therapeutic thoracentesis, will defer for now.

## 2013-11-22 NOTE — Assessment & Plan Note (Signed)
Stop proscar. Start furosemide 20 mg daily and potassium daily until next appt. Get compression hose.

## 2013-11-29 DIAGNOSIS — K5900 Constipation, unspecified: Secondary | ICD-10-CM

## 2013-11-29 DIAGNOSIS — K5909 Other constipation: Secondary | ICD-10-CM | POA: Insufficient documentation

## 2013-11-29 HISTORY — DX: Constipation, unspecified: K59.00

## 2013-12-08 ENCOUNTER — Emergency Department (HOSPITAL_COMMUNITY)
Admission: EM | Admit: 2013-12-08 | Discharge: 2013-12-08 | Disposition: A | Discharge status: Home / Self Care | Payer: Medicare PPO | Attending: Emergency Medicine | Admitting: Emergency Medicine

## 2013-12-08 ENCOUNTER — Emergency Department (HOSPITAL_COMMUNITY): Payer: Medicare PPO

## 2013-12-08 ENCOUNTER — Encounter (HOSPITAL_COMMUNITY): Payer: Self-pay | Admitting: Emergency Medicine

## 2013-12-08 DIAGNOSIS — Z87448 Personal history of other diseases of urinary system: Secondary | ICD-10-CM | POA: Insufficient documentation

## 2013-12-08 DIAGNOSIS — R296 Repeated falls: Secondary | ICD-10-CM | POA: Insufficient documentation

## 2013-12-08 DIAGNOSIS — G8929 Other chronic pain: Secondary | ICD-10-CM | POA: Insufficient documentation

## 2013-12-08 DIAGNOSIS — Z79899 Other long term (current) drug therapy: Secondary | ICD-10-CM | POA: Insufficient documentation

## 2013-12-08 DIAGNOSIS — H409 Unspecified glaucoma: Secondary | ICD-10-CM | POA: Insufficient documentation

## 2013-12-08 DIAGNOSIS — S79929A Unspecified injury of unspecified thigh, initial encounter: Secondary | ICD-10-CM

## 2013-12-08 DIAGNOSIS — Y9389 Activity, other specified: Secondary | ICD-10-CM | POA: Insufficient documentation

## 2013-12-08 DIAGNOSIS — Z7982 Long term (current) use of aspirin: Secondary | ICD-10-CM | POA: Insufficient documentation

## 2013-12-08 DIAGNOSIS — I10 Essential (primary) hypertension: Secondary | ICD-10-CM | POA: Insufficient documentation

## 2013-12-08 DIAGNOSIS — E119 Type 2 diabetes mellitus without complications: Secondary | ICD-10-CM | POA: Insufficient documentation

## 2013-12-08 DIAGNOSIS — Y929 Unspecified place or not applicable: Secondary | ICD-10-CM | POA: Insufficient documentation

## 2013-12-08 DIAGNOSIS — Z9889 Other specified postprocedural states: Secondary | ICD-10-CM | POA: Insufficient documentation

## 2013-12-08 DIAGNOSIS — K5900 Constipation, unspecified: Secondary | ICD-10-CM

## 2013-12-08 DIAGNOSIS — S79919A Unspecified injury of unspecified hip, initial encounter: Secondary | ICD-10-CM | POA: Insufficient documentation

## 2013-12-08 LAB — COMPREHENSIVE METABOLIC PANEL
ALT: 15 U/L (ref 0–53)
ANION GAP: 13 (ref 5–15)
AST: 21 U/L (ref 0–37)
Albumin: 3.8 g/dL (ref 3.5–5.2)
Alkaline Phosphatase: 106 U/L (ref 39–117)
BUN: 21 mg/dL (ref 6–23)
CO2: 29 mEq/L (ref 19–32)
Calcium: 9.3 mg/dL (ref 8.4–10.5)
Chloride: 95 mEq/L — ABNORMAL LOW (ref 96–112)
Creatinine, Ser: 1.03 mg/dL (ref 0.50–1.35)
GFR calc Af Amer: 68 mL/min — ABNORMAL LOW (ref >90)
GFR calc non Af Amer: 59 mL/min — ABNORMAL LOW (ref >90)
Glucose, Bld: 146 mg/dL — ABNORMAL HIGH (ref 70–99)
Potassium: 4.3 mEq/L (ref 3.7–5.3)
Sodium: 137 mEq/L (ref 137–147)
TOTAL PROTEIN: 7.1 g/dL (ref 6.0–8.3)
Total Bilirubin: 0.8 mg/dL (ref 0.3–1.2)

## 2013-12-08 LAB — CBC WITH DIFFERENTIAL/PLATELET
BASOS PCT: 0 % (ref 0–1)
Basophils Absolute: 0 10*3/uL (ref 0.0–0.1)
EOS ABS: 0 10*3/uL (ref 0.0–0.7)
Eosinophils Relative: 0 % (ref 0–5)
HEMATOCRIT: 42 % (ref 39.0–52.0)
Hemoglobin: 13.6 g/dL (ref 13.0–17.0)
Lymphocytes Relative: 13 % (ref 12–46)
Lymphs Abs: 1.8 10*3/uL (ref 0.7–4.0)
MCH: 29.7 pg (ref 26.0–34.0)
MCHC: 32.4 g/dL (ref 30.0–36.0)
MCV: 91.7 fL (ref 78.0–100.0)
MONO ABS: 1.7 10*3/uL — AB (ref 0.1–1.0)
MONOS PCT: 12 % (ref 3–12)
NEUTROS PCT: 75 % (ref 43–77)
Neutro Abs: 10.9 10*3/uL — ABNORMAL HIGH (ref 1.7–7.7)
Platelets: 203 10*3/uL (ref 150–400)
RBC: 4.58 MIL/uL (ref 4.22–5.81)
RDW: 13 % (ref 11.5–15.5)
WBC: 14.5 10*3/uL — ABNORMAL HIGH (ref 4.0–10.5)

## 2013-12-08 MED ORDER — POLYETHYLENE GLYCOL 3350 17 GM/SCOOP PO POWD: ORAL | Status: DC

## 2013-12-08 NOTE — ED Notes (Signed)
Physician at bedside.

## 2013-12-08 NOTE — ED Notes (Signed)
Pt fell on Monday nite and now with pain to right hip since fall.  Pt also with distended abdomen and no bowel movement since last Saturday.  Pt states not passing gas?  Pt is HOH

## 2013-12-08 NOTE — ED Provider Notes (Signed)
CSN: [id number]     Arrival date & time 12/08/13  1447 History   First MD Initiated Contact with Patient 12/08/13 1757     Chief Complaint  Patient presents with  . Fall  . Hip Pain  . Constipation     (Consider location/radiation/quality/duration/timing/severity/associated sxs/prior Treatment) HPI Comments: Patient is a 80 year old male with history of DM, HTN.  He present with complaints of abdominal distension and constipation.  He states he has not had a bowel movement in one week.  He did fall one ago and injured his right hip.  He has been ambulatory but has had some discomfort.  He denies taking any pain medications for this that may have contributed to his constipation.  No relief with laxatives and enemas.  Patient is a 80 y.o. male presenting with constipation. The history is provided by the patient.  Constipation Severity:  Moderate Time since last bowel movement:  1 week Timing:  Constant Progression:  Worsening Chronicity:  New Context: not dehydration   Relieved by:  Nothing Worsened by:  Nothing tried Ineffective treatments:  Enemas and laxatives   Past Medical History  Diagnosis Date  . BPH (benign prostatic hyperplasia)   . Diverticulosis of colon   . History of barium enema 07/1991    Diverticuli  . History of CT scan of abdomen 08/14/05    abd/pelvis with and without R adrenal adenoma-stable  . Diabetes mellitus   . Hyperlipidemia   . Glaucoma   . Hypertension   . Chronic back pain    Past Surgical History  Procedure Laterality Date  . Prostate biopsy  07/13/98    benign, cytoscopy wnl  . Shoulder surgery Right 04/03    [facility]  . Cataract extraction Left 04/01  . Cataract extraction Right 01/04    Family History  Problem Relation Age of Onset  . Cancer Mother     stomach  and colon  . Cancer Brother     possible lung cancer   History  Substance Use Topics  . Smoking status: Never Smoker   . Smokeless tobacco: Never Used  . Alcohol  Use: No    Review of Systems  Gastrointestinal: Positive for constipation.  All other systems reviewed and are negative.     Allergies  Atenolol; Esomeprazole magnesium; and Ofloxacin  Home Medications   Prior to Admission medications   Medication Sig Start Date End Date Taking? Authorizing Provider  aspirin EC 81 MG tablet Take 81 mg by mouth daily.   Yes Historical Provider, MD  furosemide (LASIX) 20 MG tablet Take 20 mg by mouth daily.   Yes Historical Provider, MD  latanoprost (XALATAN) 0.005 % ophthalmic solution Place 1 drop into the right eye at bedtime.    Yes Historical Provider, MD  lisinopril-hydrochlorothiazide (PRINZIDE,ZESTORETIC) 10-12.5 MG per tablet Take 1 tablet by mouth daily.   Yes Historical Provider, MD  potassium chloride SA (K-DUR,KLOR-CON) 20 MEQ tablet Take 20 mEq by mouth 2 (two) times daily.   Yes Historical Provider, MD  timolol (TIMOPTIC) 0.5 % ophthalmic solution Place 1 drop into both eyes 2 (two) times daily.     Yes Historical Provider, MD   BP 182/85  Pulse 78  Temp(Src) 97.8 F (36.6 C) (Oral)  Resp 19  SpO2 94% Physical Exam  Nursing note and vitals reviewed. Constitutional: He is oriented to person, place, and time. He appears well-developed and well-nourished. No distress.  HENT:  Head: Normocephalic and atraumatic.  Mouth/Throat: Oropharynx is  clear and moist.  Neck: Normal range of motion. Neck supple.  Cardiovascular: Normal rate, regular rhythm and normal heart sounds.   No murmur heard. Pulmonary/Chest: Effort normal and breath sounds normal. No respiratory distress. He has no wheezes.  Abdominal: Soft. Bowel sounds are normal. He exhibits distension. There is no tenderness. There is no rebound.  Genitourinary: Rectum normal.  There are no masses with DRE.  Slight amount of brown stool present, but no fecal impaction.  Musculoskeletal: Normal range of motion. He exhibits no edema.  Neurological: He is alert and oriented to  person, place, and time.  Skin: Skin is warm and dry. He is not diaphoretic.    ED Course  Procedures (including critical care time) Labs Review Labs Reviewed - No data to display  Imaging Review No results found.   EKG Interpretation None      MDM   Final diagnoses:  None    Patient presents with constipation, no bm for one week.  By rectal exam, he is not impacted.  There is significant stool on the abd series, but no definite sbo.  Will treat with miralax, prn follow up.    He also has a completely opacified left hemithorax from a pleural effusion.  This has been known since January and the patient desires no further intervention for this.    Geoffery Lyons, MD 12/08/13 [phone]

## 2013-12-08 NOTE — Discharge Instructions (Signed)
Mix 255 g of miralax in 64 ounces of gatorade and consume over several hours.    Return to the ER for high fever, severe pain, bloody stool, or other new and concerning symptoms.   Constipation Constipation is when a person has fewer than three bowel movements a week, has difficulty having a bowel movement, or has stools that are dry, hard, or larger than normal. As people grow older, constipation is more common. If you try to fix constipation with medicines that make you have a bowel movement (laxatives), the problem may get worse. Long-term laxative use may cause the muscles of the colon to become weak. A low-fiber diet, not taking in enough fluids, and taking certain medicines may make constipation worse.  CAUSES   Certain medicines, such as antidepressants, pain medicine, iron supplements, antacids, and water pills.   Certain diseases, such as diabetes, irritable bowel syndrome (IBS), thyroid disease, or depression.   Not drinking enough water.   Not eating enough fiber-rich foods.   Stress or travel.   Lack of physical activity or exercise.   Ignoring the urge to have a bowel movement.   Using laxatives too much.  SIGNS AND SYMPTOMS   Having fewer than three bowel movements a week.   Straining to have a bowel movement.   Having stools that are hard, dry, or larger than normal.   Feeling full or bloated.   Pain in the lower abdomen.   Not feeling relief after having a bowel movement.  DIAGNOSIS  Your health care provider will take a medical history and perform a physical exam. Further testing may be done for severe constipation. Some tests may include:  A barium enema X-ray to examine your rectum, colon, and, sometimes, your small intestine.   A sigmoidoscopy to examine your lower colon.   A colonoscopy to examine your entire colon. TREATMENT  Treatment will depend on the severity of your constipation and what is causing it. Some dietary treatments  include drinking more fluids and eating more fiber-rich foods. Lifestyle treatments may include regular exercise. If these diet and lifestyle recommendations do not help, your health care provider may recommend taking over-the-counter laxative medicines to help you have bowel movements. Prescription medicines may be prescribed if over-the-counter medicines do not work.  HOME CARE INSTRUCTIONS   Eat foods that have a lot of fiber, such as fruits, vegetables, whole grains, and beans.  Limit foods high in fat and processed sugars, such as french fries, hamburgers, cookies, candies, and soda.   A fiber supplement may be added to your diet if you cannot get enough fiber from foods.   Drink enough fluids to keep your urine clear or pale yellow.   Exercise regularly or as directed by your health care provider.   Go to the restroom when you have the urge to go. Do not hold it.   Only take over-the-counter or prescription medicines as directed by your health care provider. Do not take other medicines for constipation without talking to your health care provider first.  SEEK IMMEDIATE MEDICAL CARE IF:   You have bright red blood in your stool.   Your constipation lasts for more than 4 days or gets worse.   You have abdominal or rectal pain.   You have thin, pencil-like stools.   You have unexplained weight loss. MAKE SURE YOU:   Understand these instructions.  Will watch your condition.  Will get help right away if you are not doing well or get worse.  Document Released: 02/14/2004 Document Revised: 05/23/2013 Document Reviewed: 02/27/2013 [facility] Patient Information 2015 [place], [place]. This information is not intended to replace advice given to you by your health care provider. Make sure you discuss any questions you have with your health care provider.

## 2013-12-10 ENCOUNTER — Encounter: Payer: Self-pay | Admitting: Family Medicine

## 2013-12-12 ENCOUNTER — Telehealth: Payer: Self-pay | Admitting: Family Medicine

## 2013-12-12 NOTE — Telephone Encounter (Signed)
Patient Information:  [facility]  Phone: [phone]  Patient: Ronald Ball, [place]  Gender: Male  DOB: 10/21/1913  Age: 80 Years  PCP: Eustaquio BoydenGutierrez, [facility](Family Practice)  Office Follow Up:  Does the office need to follow up with this patient?: No  Instructions For The Office: N/A  RN Note:  Patient had a bowel movement while wife was speaking with triager. She will call back if patient has any further issues having a bowel movement.  Symptoms  Reason For Call & Symptoms: Constipation - last bowel movement 12/02/13. Reports abdomen is swollen.  Reviewed Health History In EMR: Yes  Reviewed Medications In EMR: Yes  Reviewed Allergies In EMR: Yes  Reviewed Surgeries / Procedures: Yes  Date of Onset of Symptoms: 12/08/2013  Treatments Tried: Polyethylene Gycol, Milk of Magnesia, Citroma, enema  Treatments Tried Worked: No  Guideline(s) Used:  Constipation  Disposition Per Guideline:   See Today in Office  Reason For Disposition Reached:   Abdomen is more swollen than usual  Advice Given:  N/A  Patient Refused Recommendation:  Patient Refused Care Advice  Patient had a bowel movement while triager was talking with spouse.

## 2013-12-15 ENCOUNTER — Telehealth: Payer: Self-pay

## 2013-12-15 NOTE — Telephone Encounter (Signed)
Dr [name] said needs to be evaluated;Mrs Ronald SmallRoberson said she will call her son and then call ambulance to take pt to [facility].

## 2013-12-15 NOTE — Telephone Encounter (Signed)
Mrs Sedonia SmallRoberson said pt continuing to have right hip pain since sliding out of bed and sat on floor 2 weeks ago. [facility] hip xrayed on 12/08/13; pt said ED did not tell pt any report on hip xray. Rt hip pain is no worse but not any better. Pt can walk from bed to bathroom but this morning pt could not walk to kitchen. Mrs Sedonia SmallRoberson said pt is not able to be seen; pt offered 2 appt today but could not come in.pt wants to know what to do. Pt has been taking OTC extra strength acetaminophen 1 tab in AM and 1 tab in PM. Pt has tried taking 2 tabs and that does not help any more than taking 1 tab. Pain level now is 10.

## 2013-12-18 ENCOUNTER — Ambulatory Visit: Payer: Medicare PPO | Admitting: Family Medicine

## 2013-12-21 NOTE — Telephone Encounter (Signed)
Can we call for update on pt, and [facility]

## 2013-12-22 NOTE — Telephone Encounter (Signed)
Patient's wife notified and verbalized understanding. Appt scheduled for Monday with the understanding that if pain worsens over the weekend to go to [facility]. He declined Tramadol.

## 2013-12-22 NOTE — Telephone Encounter (Signed)
Recommend 2 tylenol twice daily and come in for evaluation [facility]. If he'd like we can phone in stronger pain med to get him into office for evaluation - if desired, may phone in tramadol 50mg  bid prn mod pain #30, but watch for sedation on this med.

## 2013-12-22 NOTE — Telephone Encounter (Signed)
Spoke with patient's wife. She said he didn't go to the hospital or [facility]. She [facility] and he didn't want to go back to the ER and wait. She said he is still in a great deal of pain and the tylenol doesn't help too much. He is only taking it 1 BID. He has finally had a BM, but that didn't help things either. She said he can get around the house, but can't manage to get his leg up high enough to get in the car to get anywhere. The pain is in his lower back and hip.

## 2013-12-25 ENCOUNTER — Encounter: Payer: Self-pay | Admitting: Family Medicine

## 2013-12-25 ENCOUNTER — Ambulatory Visit (INDEPENDENT_AMBULATORY_CARE_PROVIDER_SITE_OTHER): Payer: Medicare PPO | Admitting: Family Medicine

## 2013-12-25 ENCOUNTER — Ambulatory Visit (INDEPENDENT_AMBULATORY_CARE_PROVIDER_SITE_OTHER)
Admission: RE | Admit: 2013-12-25 | Discharge: 2013-12-25 | Disposition: A | Discharge status: Home / Self Care | Payer: Medicare PPO | Source: Ambulatory Visit | Attending: Family Medicine | Admitting: Family Medicine

## 2013-12-25 VITALS — BP 110/70 | HR 76 | Temp 97.8°F | Wt 188.5 lb

## 2013-12-25 DIAGNOSIS — M545 Low back pain, unspecified: Secondary | ICD-10-CM

## 2013-12-25 DIAGNOSIS — R5381 Other malaise: Secondary | ICD-10-CM

## 2013-12-25 DIAGNOSIS — G8929 Other chronic pain: Secondary | ICD-10-CM

## 2013-12-25 DIAGNOSIS — R531 Weakness: Secondary | ICD-10-CM

## 2013-12-25 DIAGNOSIS — M549 Dorsalgia, unspecified: Secondary | ICD-10-CM

## 2013-12-25 DIAGNOSIS — R5383 Other fatigue: Secondary | ICD-10-CM

## 2013-12-25 DIAGNOSIS — K5900 Constipation, unspecified: Secondary | ICD-10-CM

## 2013-12-25 MED ORDER — CLOTRIMAZOLE 1 % EX CREA: 1.0000 "application " | TOPICAL_CREAM | Freq: Two times a day (BID) | CUTANEOUS | Status: DC

## 2013-12-25 NOTE — Assessment & Plan Note (Addendum)
Ongoing for last several months, increasing fall risk - and has had 1 fall in last month (slipped). Homebound status due to gen weakness along with chronic back pain and recent increased fall risk. I have recommended a home health physical therapy program for fall prevention and general strengthening. Pt/fam decline for now, state they will notify me if weakness persists. Will RTC if weight loss persists.

## 2013-12-25 NOTE — Progress Notes (Signed)
Pre visit review using our clinic review tool, if applicable. No additional management support is needed unless otherwise documented below in the visit note. 

## 2013-12-25 NOTE — Progress Notes (Signed)
BP 110/70  Pulse 76  Temp(Src) 97.8 F (36.6 C) (Oral)  Wt 188 lb 8 oz (85.503 kg)   CC: hip pain, constipation  Subjective:    Patient ID: Ronald Ball, male    DOB: 11/02/1913, 80 y.o.   MRN: [id number]  HPI: Ronald Ball is a 80 y.o. male presenting on 12/25/2013 for Back Pain   Ongoing issue with chronic constipation. S/p ER visit treated with high dose miralax and milk of magnesia. ER records reviewed. Had 2 stools today, denies trouble with constipation currently.  R hip/lower back pain ongoing for 1-2 months - taking 1 tylenol 2-3 times a day which has helped more recently. Started after he slid out of bed. Denies fevers/chills, bowel/bladder accidents, shooting pain down legs, numbness or weakness of legs.  Has had several falls in last month - feet slip out from underneath him.  H/o chronic LBP, has been told has arthritis and breakdown of vertebral discs, s/p ESI in past. MRI scan of 03/15/06 revealed severe central canal stenosis at L4-5. He had extensive facet hypertrophy bilaterally. I do not have access to this.  Wt Readings from Last 3 Encounters:  12/25/13 188 lb 8 oz (85.503 kg)  11/16/13 201 lb 8 oz (91.4 kg)  11/14/13 204 lb 8 oz (92.761 kg)   Body mass index is 27.05 kg/(m^2).  Relevant past medical, surgical, family and social history reviewed and updated as indicated.  Allergies and medications reviewed and updated. Current Outpatient Prescriptions on File Prior to Visit  Medication Sig  . aspirin EC 81 MG tablet Take 81 mg by mouth daily.  . furosemide (LASIX) 20 MG tablet Take 20 mg by mouth daily.  Marland Kitchen. latanoprost (XALATAN) 0.005 % ophthalmic solution Place 1 drop into the right eye at bedtime.   Marland Kitchen. lisinopril-hydrochlorothiazide (PRINZIDE,ZESTORETIC) 10-12.5 MG per tablet Take 1 tablet by mouth daily.  . timolol (TIMOPTIC) 0.5 % ophthalmic solution Place 1 drop into both eyes 2 (two) times daily.    . polyethylene glycol powder  (GLYCOLAX/MIRALAX) powder Mix 255 g container in 64 ounces of gatorade and drink over several hours.    Repeat once as necessary.  . potassium chloride SA (K-DUR,KLOR-CON) 20 MEQ tablet Take 20 mEq by mouth 2 (two) times daily.   No current facility-administered medications on file prior to visit.    Review of Systems Per HPI unless specifically indicated above    Objective:    BP 110/70  Pulse 76  Temp(Src) 97.8 F (36.6 C) (Oral)  Wt 188 lb 8 oz (85.503 kg)  Physical Exam  Nursing note and vitals reviewed. Constitutional: He appears well-developed and well-nourished. No distress.  Cardiovascular: Normal rate, regular rhythm, normal heart sounds and intact distal pulses.   No murmur heard. Musculoskeletal: He exhibits no edema.  Mild discomfort to palpation midline lumbar spine. No paraspinous mm tenderness. Tender at R SIJ and R GTB No pain at sciatic notch No pain with int/ext rotation at hips  Skin: Abrasion noted.  Abrasions with granulation tissue and mild surrounding erythema bilateral anterior knees L great toe with subcm ulcer with clean base      Assessment & Plan:   Problem List Items Addressed This Visit   Right LBP - Primary     Anticipate acute flare of chronic LBP with known marked DDD and central canal stenosis  Encouraged he continue scheduled tylenol 500mg  tid, with option to increase to 1000mg  tid prn. Discussed stronger pain meds such as tramadol,  pt hesitant for this given possible constipation side effect. No red flags today. Not consistent with hip pathology today. Lumbar xray - marked multilevel DDD with some scoliosis, will await radiology eval.    General weakness     Ongoing for last several months, increasing fall risk - and has had 1 fall in last month (slipped). Homebound status due to gen weakness along with chronic back pain and recent increased fall risk. I have recommended a home health physical therapy program for fall prevention and  general strengthening. Pt/fam decline for now, state they will notify me if weakness persists. Will RTC if weight loss persists.    Constipation     Not currently needing miralax or MOM regularly, but has at home.    Chronic back pain   Relevant Orders      DG Lumbar Spine Complete       Follow up plan: Return if symptoms worsen or fail to improve, for as needed.

## 2013-12-25 NOTE — Assessment & Plan Note (Addendum)
Not currently needing miralax or MOM regularly, but has at home.

## 2013-12-25 NOTE — Patient Instructions (Addendum)
Continue scheduled tylenol 500mg  three times daily with option to increase to 1000mg  three time daily if needed. Continue polysporin ointment on wounds with daily dressing changes. Watch for spreading redness or not healing as expected. Try lotrimin for toe ulcer. Let me know if not healing as expected. Return to see me if weight loss persists.

## 2013-12-25 NOTE — Assessment & Plan Note (Addendum)
Anticipate acute flare of chronic LBP with known marked DDD and central canal stenosis  Encouraged he continue scheduled tylenol 500mg  tid, with option to increase to 1000mg  tid prn. Discussed stronger pain meds such as tramadol, pt hesitant for this given possible constipation side effect. No red flags today. Not consistent with hip pathology today. Lumbar xray - marked multilevel DDD with some scoliosis, will await radiology eval.

## 2014-01-11 ENCOUNTER — Encounter: Payer: Self-pay | Admitting: Family Medicine

## 2014-01-11 ENCOUNTER — Ambulatory Visit (INDEPENDENT_AMBULATORY_CARE_PROVIDER_SITE_OTHER): Payer: Medicare PPO | Admitting: Family Medicine

## 2014-01-11 VITALS — BP 110/60 | HR 64 | Temp 97.8°F | Wt 186.5 lb

## 2014-01-11 DIAGNOSIS — S32009A Unspecified fracture of unspecified lumbar vertebra, initial encounter for closed fracture: Secondary | ICD-10-CM

## 2014-01-11 DIAGNOSIS — M549 Dorsalgia, unspecified: Secondary | ICD-10-CM

## 2014-01-11 DIAGNOSIS — M545 Low back pain, unspecified: Secondary | ICD-10-CM

## 2014-01-11 DIAGNOSIS — G8929 Other chronic pain: Secondary | ICD-10-CM

## 2014-01-11 DIAGNOSIS — S32019A Unspecified fracture of first lumbar vertebra, initial encounter for closed fracture: Secondary | ICD-10-CM

## 2014-01-11 DIAGNOSIS — K5900 Constipation, unspecified: Secondary | ICD-10-CM

## 2014-01-11 HISTORY — DX: Unspecified fracture of first lumbar vertebra, initial encounter for closed fracture: S32.019A

## 2014-01-11 MED ORDER — TRAMADOL HCL 50 MG PO TABS: 25.0000 mg | ORAL_TABLET | Freq: Two times a day (BID) | ORAL | Status: DC | PRN

## 2014-01-11 NOTE — Assessment & Plan Note (Signed)
See above

## 2014-01-11 NOTE — Progress Notes (Signed)
BP 110/60  Pulse 64  Temp(Src) 97.8 F (36.6 C) (Oral)  Wt 186 lb 8 oz (84.596 kg)   CC: worsening back pain  Subjective:    Patient ID: Ronald Ball, male    DOB: 08/14/1913, 80 y.o.   MRN: [id number]  HPI: Ronald RollsWilliam W Ball is a 80 y.o. male presenting on 01/11/2014 for Back Pain   R hip/lower back pain ongoing for last several months - taking 1 tylenol 3-4 times a day which is not helping as much recently. Started after he slid out of bed. Denies fevers/chills, bowel/bladder accidents, shooting pain down legs, numbness or weakness of legs. More trouble controlling bowels.  H/o chronic LBP, has been told has arthritis and breakdown of vertebral discs, s/p ESI in past (2007).  MRI scan of 03/15/06 revealed severe central canal stenosis at L4-5. He had extensive facet hypertrophy bilaterally. I do not have access to this MRI.  Continued constipation issues despite prn miralax use.  Last visit: LUMBAR SPINE - COMPLETE 4+ VIEW  COMPARISON: [facility] 12/08/2013. Lumbar MRI 03/27/2008  FINDINGS:  Suboptimal image quality due to large patient size. Difficulty with  positioning and underpenetration.  Mild compression fracture L1 of indeterminate age but not present in  2009.  Mild dextroscoliosis. Multilevel disc degeneration and spondylosis  throughout the lumbar spine. No mass lesion.  IMPRESSION:  Suboptimal image quality  Mild compression fracture L1 of indeterminate age.  Electronically Signed  By: Marlan Palauharles Clark M.D.  On: 12/25/2013 14:58  Relevant past medical, surgical, family and social history reviewed and updated as indicated.  Allergies and medications reviewed and updated. Current Outpatient Prescriptions on File Prior to Visit  Medication Sig  . aspirin EC 81 MG tablet Take 81 mg by mouth daily.  . clotrimazole (LOTRIMIN AF) 1 % cream Apply 1 application topically 2 (two) times daily.  Marland Kitchen. latanoprost (XALATAN) 0.005 % ophthalmic solution Place 1 drop into the  right eye at bedtime.   Marland Kitchen. lisinopril-hydrochlorothiazide (PRINZIDE,ZESTORETIC) 10-12.5 MG per tablet Take 1 tablet by mouth daily.  . timolol (TIMOPTIC) 0.5 % ophthalmic solution Place 1 drop into both eyes 2 (two) times daily.    . furosemide (LASIX) 20 MG tablet Take 20 mg by mouth daily.  . polyethylene glycol powder (GLYCOLAX/MIRALAX) powder Mix 255 g container in 64 ounces of gatorade and drink over several hours.    Repeat once as necessary.  . potassium chloride SA (K-DUR,KLOR-CON) 20 MEQ tablet Take 20 mEq by mouth 2 (two) times daily.   No current facility-administered medications on file prior to visit.    Review of Systems Per HPI unless specifically indicated above    Objective:    BP 110/60  Pulse 64  Temp(Src) 97.8 F (36.6 C) (Oral)  Wt 186 lb 8 oz (84.596 kg)  Physical Exam  Nursing note and vitals reviewed. Constitutional: He appears well-developed and well-nourished. No distress.  Musculoskeletal: He exhibits no edema.  Tender to palpation midline upper lumbar region  Tender paraspinously as well       Assessment & Plan:   Problem List Items Addressed This Visit   Chronic back pain     See above.    Relevant Medications      traMADol (ULTRAM) tablet 50 mg   Chronic constipation     Reviewed need for regular bowel regimen. rec start miralax 17gm in 8oz fluid daily, with additional MOM prn persistent constipation.    Right LBP   Relevant Medications  traMADol (ULTRAM) tablet 50 mg   Other Relevant Orders      MR Lumbar Spine Wo Contrast   L1 vertebral fracture - Primary     Recently found on lumbar films. Anticipate suffered after one of recent falls. Persistent pain despite regular tylenol use. Will add on tramadol, discussed concerns with constipation side effect. See above. Will also obtain MRI to further eval compression fracture as well as r/o compression in known spinal stenosis. If persistent pain despite above, would consider kyphoplasty  (pt currently hesitant for this).    Relevant Orders      MR Lumbar Spine Wo Contrast       Follow up plan: Return if symptoms worsen or fail to improve.

## 2014-01-11 NOTE — Patient Instructions (Signed)
Continue tylenol 500mg  3-4 times daily. Add on tramadol for pain 1/2 - 1 tablet twice daily. Continue miralax daily. May use milk of magnesia as needed for persistent constipation despite miralax. Pass by Marion's office today for scheduling MRI.

## 2014-01-11 NOTE — Assessment & Plan Note (Signed)
Reviewed need for regular bowel regimen. rec start miralax 17gm in 8oz fluid daily, with additional MOM prn persistent constipation.

## 2014-01-11 NOTE — Progress Notes (Signed)
Pre visit review using our clinic review tool, if applicable. No additional management support is needed unless otherwise documented below in the visit note. 

## 2014-01-11 NOTE — Assessment & Plan Note (Addendum)
Recently found on lumbar films. Anticipate suffered after one of recent falls. Persistent pain despite regular tylenol use. Will add on tramadol, discussed concerns with constipation side effect. See above. Will also obtain MRI to further eval compression fracture as well as r/o compression in known spinal stenosis. If persistent pain despite above, would consider kyphoplasty (pt currently hesitant for this).

## 2014-01-15 ENCOUNTER — Ambulatory Visit
Admission: RE | Admit: 2014-01-15 | Discharge: 2014-01-15 | Disposition: A | Discharge status: Home / Self Care | Payer: Medicare PPO | Source: Ambulatory Visit | Attending: Family Medicine | Admitting: Family Medicine

## 2014-01-15 DIAGNOSIS — S32019A Unspecified fracture of first lumbar vertebra, initial encounter for closed fracture: Secondary | ICD-10-CM

## 2014-01-15 DIAGNOSIS — M545 Low back pain, unspecified: Secondary | ICD-10-CM

## 2014-01-18 ENCOUNTER — Other Ambulatory Visit: Payer: Self-pay | Admitting: Family Medicine

## 2014-01-18 ENCOUNTER — Other Ambulatory Visit: Payer: Medicare PPO

## 2014-01-18 DIAGNOSIS — S32019A Unspecified fracture of first lumbar vertebra, initial encounter for closed fracture: Secondary | ICD-10-CM

## 2014-01-18 DIAGNOSIS — M48061 Spinal stenosis, lumbar region without neurogenic claudication: Secondary | ICD-10-CM | POA: Insufficient documentation

## 2014-01-18 HISTORY — DX: Spinal stenosis, lumbar region without neurogenic claudication: M48.061

## 2014-01-18 MED ORDER — BISACODYL 10 MG RE SUPP: 10.0000 mg | RECTAL | Status: DC | PRN

## 2014-01-19 ENCOUNTER — Telehealth: Payer: Self-pay

## 2014-01-19 NOTE — Telephone Encounter (Signed)
Patient's wife notified and Rx placed up front for pick up. Map and contact info for [facility] included.

## 2014-01-19 NOTE — Telephone Encounter (Signed)
Ronald Ball with [facility], Dr Rush FarmerSterns office left v/m; pt has appt to see Dr Rush FarmerSterns on 01/24/14 but Dr Rush FarmerSterns said pt needs back brace prior to appt. Suggest to send [facility] to [facility] for back brace. Please advise.

## 2014-01-19 NOTE — Telephone Encounter (Signed)
Back brace script written for patient. plz notify family back doctor recommends back brace prior to being seen and have them pick up Rx. Placed in Kim's box. NSG recommends [facility].

## 2014-01-30 DIAGNOSIS — E119 Type 2 diabetes mellitus without complications: Secondary | ICD-10-CM

## 2014-01-30 DIAGNOSIS — S72142A Displaced intertrochanteric fracture of left femur, initial encounter for closed fracture: Secondary | ICD-10-CM

## 2014-01-30 HISTORY — PX: INTRAMEDULLARY (IM) NAIL INTERTROCHANTERIC: SHX5875

## 2014-01-30 HISTORY — DX: Displaced intertrochanteric fracture of left femur, initial encounter for closed fracture: S72.142A

## 2014-01-30 HISTORY — DX: Type 2 diabetes mellitus without complications: E11.9

## 2014-02-16 ENCOUNTER — Encounter (HOSPITAL_COMMUNITY): Payer: Self-pay | Admitting: Emergency Medicine

## 2014-02-16 ENCOUNTER — Emergency Department (HOSPITAL_COMMUNITY): Payer: Medicare PPO

## 2014-02-16 ENCOUNTER — Inpatient Hospital Stay (HOSPITAL_COMMUNITY)
Admission: EM | Admit: 2014-02-16 | Discharge: 2014-02-21 | DRG: 480 | Disposition: A | Discharge status: Skilled Nursing Facility | Payer: Medicare PPO | Attending: Internal Medicine | Admitting: Internal Medicine

## 2014-02-16 DIAGNOSIS — Z79899 Other long term (current) drug therapy: Secondary | ICD-10-CM | POA: Diagnosis not present

## 2014-02-16 DIAGNOSIS — S72143A Displaced intertrochanteric fracture of unspecified femur, initial encounter for closed fracture: Secondary | ICD-10-CM | POA: Diagnosis present

## 2014-02-16 DIAGNOSIS — Z9849 Cataract extraction status, unspecified eye: Secondary | ICD-10-CM | POA: Diagnosis not present

## 2014-02-16 DIAGNOSIS — I471 Supraventricular tachycardia, unspecified: Secondary | ICD-10-CM | POA: Diagnosis not present

## 2014-02-16 DIAGNOSIS — Z87448 Personal history of other diseases of urinary system: Secondary | ICD-10-CM

## 2014-02-16 DIAGNOSIS — Z7982 Long term (current) use of aspirin: Secondary | ICD-10-CM | POA: Diagnosis not present

## 2014-02-16 DIAGNOSIS — D72829 Elevated white blood cell count, unspecified: Secondary | ICD-10-CM | POA: Diagnosis present

## 2014-02-16 DIAGNOSIS — K5909 Other constipation: Secondary | ICD-10-CM

## 2014-02-16 DIAGNOSIS — I498 Other specified cardiac arrhythmias: Secondary | ICD-10-CM | POA: Diagnosis present

## 2014-02-16 DIAGNOSIS — W19XXXA Unspecified fall, initial encounter: Secondary | ICD-10-CM

## 2014-02-16 DIAGNOSIS — G8929 Other chronic pain: Secondary | ICD-10-CM

## 2014-02-16 DIAGNOSIS — Y92009 Unspecified place in unspecified non-institutional (private) residence as the place of occurrence of the external cause: Secondary | ICD-10-CM

## 2014-02-16 DIAGNOSIS — I4891 Unspecified atrial fibrillation: Secondary | ICD-10-CM | POA: Diagnosis present

## 2014-02-16 DIAGNOSIS — J9600 Acute respiratory failure, unspecified whether with hypoxia or hypercapnia: Secondary | ICD-10-CM | POA: Diagnosis not present

## 2014-02-16 DIAGNOSIS — H409 Unspecified glaucoma: Secondary | ICD-10-CM | POA: Diagnosis present

## 2014-02-16 DIAGNOSIS — W010XXA Fall on same level from slipping, tripping and stumbling without subsequent striking against object, initial encounter: Secondary | ICD-10-CM | POA: Diagnosis present

## 2014-02-16 DIAGNOSIS — E41 Nutritional marasmus: Secondary | ICD-10-CM | POA: Diagnosis present

## 2014-02-16 DIAGNOSIS — J90 Pleural effusion, not elsewhere classified: Secondary | ICD-10-CM

## 2014-02-16 DIAGNOSIS — J9601 Acute respiratory failure with hypoxia: Secondary | ICD-10-CM

## 2014-02-16 DIAGNOSIS — Z888 Allergy status to other drugs, medicaments and biological substances status: Secondary | ICD-10-CM | POA: Diagnosis not present

## 2014-02-16 DIAGNOSIS — K5730 Diverticulosis of large intestine without perforation or abscess without bleeding: Secondary | ICD-10-CM

## 2014-02-16 DIAGNOSIS — E119 Type 2 diabetes mellitus without complications: Secondary | ICD-10-CM

## 2014-02-16 DIAGNOSIS — Z800 Family history of malignant neoplasm of digestive organs: Secondary | ICD-10-CM

## 2014-02-16 DIAGNOSIS — Z881 Allergy status to other antibiotic agents status: Secondary | ICD-10-CM

## 2014-02-16 DIAGNOSIS — I519 Heart disease, unspecified: Secondary | ICD-10-CM | POA: Diagnosis not present

## 2014-02-16 DIAGNOSIS — R21 Rash and other nonspecific skin eruption: Secondary | ICD-10-CM

## 2014-02-16 DIAGNOSIS — I4719 Other supraventricular tachycardia: Secondary | ICD-10-CM

## 2014-02-16 DIAGNOSIS — M549 Dorsalgia, unspecified: Secondary | ICD-10-CM | POA: Diagnosis present

## 2014-02-16 DIAGNOSIS — I10 Essential (primary) hypertension: Secondary | ICD-10-CM

## 2014-02-16 DIAGNOSIS — W19XXXD Unspecified fall, subsequent encounter: Secondary | ICD-10-CM

## 2014-02-16 DIAGNOSIS — M1990 Unspecified osteoarthritis, unspecified site: Secondary | ICD-10-CM | POA: Diagnosis present

## 2014-02-16 DIAGNOSIS — K449 Diaphragmatic hernia without obstruction or gangrene: Secondary | ICD-10-CM

## 2014-02-16 DIAGNOSIS — R600 Localized edema: Secondary | ICD-10-CM

## 2014-02-16 DIAGNOSIS — Z5189 Encounter for other specified aftercare: Secondary | ICD-10-CM

## 2014-02-16 DIAGNOSIS — S72009A Fracture of unspecified part of neck of unspecified femur, initial encounter for closed fracture: Secondary | ICD-10-CM | POA: Diagnosis present

## 2014-02-16 DIAGNOSIS — Z6825 Body mass index (BMI) 25.0-25.9, adult: Secondary | ICD-10-CM | POA: Diagnosis not present

## 2014-02-16 DIAGNOSIS — Z66 Do not resuscitate: Secondary | ICD-10-CM | POA: Diagnosis present

## 2014-02-16 DIAGNOSIS — R0902 Hypoxemia: Secondary | ICD-10-CM

## 2014-02-16 DIAGNOSIS — E785 Hyperlipidemia, unspecified: Secondary | ICD-10-CM | POA: Diagnosis present

## 2014-02-16 DIAGNOSIS — M48061 Spinal stenosis, lumbar region without neurogenic claudication: Secondary | ICD-10-CM

## 2014-02-16 DIAGNOSIS — S72002K Fracture of unspecified part of neck of left femur, subsequent encounter for closed fracture with nonunion: Secondary | ICD-10-CM

## 2014-02-16 DIAGNOSIS — M25559 Pain in unspecified hip: Secondary | ICD-10-CM | POA: Diagnosis present

## 2014-02-16 DIAGNOSIS — R42 Dizziness and giddiness: Secondary | ICD-10-CM

## 2014-02-16 DIAGNOSIS — R531 Weakness: Secondary | ICD-10-CM

## 2014-02-16 DIAGNOSIS — R3915 Urgency of urination: Secondary | ICD-10-CM

## 2014-02-16 DIAGNOSIS — E7800 Pure hypercholesterolemia, unspecified: Secondary | ICD-10-CM

## 2014-02-16 DIAGNOSIS — M129 Arthropathy, unspecified: Secondary | ICD-10-CM

## 2014-02-16 DIAGNOSIS — I480 Paroxysmal atrial fibrillation: Secondary | ICD-10-CM

## 2014-02-16 DIAGNOSIS — N400 Enlarged prostate without lower urinary tract symptoms: Secondary | ICD-10-CM

## 2014-02-16 DIAGNOSIS — J301 Allergic rhinitis due to pollen: Secondary | ICD-10-CM

## 2014-02-16 DIAGNOSIS — J95811 Postprocedural pneumothorax: Secondary | ICD-10-CM | POA: Diagnosis not present

## 2014-02-16 DIAGNOSIS — E559 Vitamin D deficiency, unspecified: Secondary | ICD-10-CM

## 2014-02-16 LAB — CBC WITH DIFFERENTIAL/PLATELET
Basophils Absolute: 0 10*3/uL (ref 0.0–0.1)
Basophils Relative: 0 % (ref 0–1)
Eosinophils Absolute: 0.1 10*3/uL (ref 0.0–0.7)
Eosinophils Relative: 1 % (ref 0–5)
HCT: 43.5 % (ref 39.0–52.0)
Hemoglobin: 14.1 g/dL (ref 13.0–17.0)
Lymphocytes Relative: 12 % (ref 12–46)
Lymphs Abs: 1.4 10*3/uL (ref 0.7–4.0)
MCH: 29.3 pg (ref 26.0–34.0)
MCHC: 32.4 g/dL (ref 30.0–36.0)
MCV: 90.2 fL (ref 78.0–100.0)
Monocytes Absolute: 1.3 10*3/uL — ABNORMAL HIGH (ref 0.1–1.0)
Monocytes Relative: 10 % (ref 3–12)
Neutro Abs: 9.5 10*3/uL — ABNORMAL HIGH (ref 1.7–7.7)
Neutrophils Relative %: 77 % (ref 43–77)
Platelets: 187 10*3/uL (ref 150–400)
RBC: 4.82 MIL/uL (ref 4.22–5.81)
RDW: 13.8 % (ref 11.5–15.5)
WBC: 12.2 10*3/uL — ABNORMAL HIGH (ref 4.0–10.5)

## 2014-02-16 LAB — BASIC METABOLIC PANEL
Anion gap: 12 (ref 5–15)
BUN: 22 mg/dL (ref 6–23)
CO2: 30 mEq/L (ref 19–32)
Calcium: 9.4 mg/dL (ref 8.4–10.5)
Chloride: 93 mEq/L — ABNORMAL LOW (ref 96–112)
Creatinine, Ser: 1.05 mg/dL (ref 0.50–1.35)
GFR calc Af Amer: 66 mL/min — ABNORMAL LOW (ref >90)
GFR calc non Af Amer: 57 mL/min — ABNORMAL LOW (ref >90)
Glucose, Bld: 135 mg/dL — ABNORMAL HIGH (ref 70–99)
Potassium: 4.1 mEq/L (ref 3.7–5.3)
Sodium: 135 mEq/L — ABNORMAL LOW (ref 137–147)

## 2014-02-16 MED ORDER — MORPHINE SULFATE 4 MG/ML IJ SOLN
4.0000 mg | Freq: Once | INTRAMUSCULAR | Status: AC
Start: 2014-02-16 — End: 2014-02-16
  Administered 2014-02-16: 4 mg via INTRAVENOUS
  Filled 2014-02-16: qty 1

## 2014-02-16 MED ORDER — HYDROCODONE-ACETAMINOPHEN 5-325 MG PO TABS: 1.0000 | ORAL_TABLET | Freq: Four times a day (QID) | ORAL | Status: DC | PRN

## 2014-02-16 MED ORDER — METHOCARBAMOL 500 MG PO TABS: 500.0000 mg | ORAL_TABLET | Freq: Four times a day (QID) | ORAL | Status: DC | PRN

## 2014-02-16 MED ORDER — METHOCARBAMOL 1000 MG/10ML IJ SOLN
500.0000 mg | Freq: Four times a day (QID) | INTRAVENOUS | Status: DC | PRN
  Filled 2014-02-16: qty 5

## 2014-02-16 MED ORDER — MORPHINE SULFATE 2 MG/ML IJ SOLN
0.5000 mg | INTRAMUSCULAR | Status: DC | PRN
  Administered 2014-02-17 (×2): 0.5 mg via INTRAVENOUS
  Filled 2014-02-16 (×2): qty 1

## 2014-02-16 MED ORDER — TIMOLOL MALEATE 0.5 % OP SOLN
1.0000 [drp] | Freq: Two times a day (BID) | OPHTHALMIC | Status: DC
  Administered 2014-02-17 – 2014-02-21 (×9): 1 [drp] via OPHTHALMIC
  Filled 2014-02-16: qty 5

## 2014-02-16 MED ORDER — LATANOPROST 0.005 % OP SOLN
1.0000 [drp] | Freq: Every day | OPHTHALMIC | Status: DC
  Administered 2014-02-17 – 2014-02-20 (×4): 1 [drp] via OPHTHALMIC
  Filled 2014-02-16: qty 2.5

## 2014-02-16 MED ORDER — SODIUM CHLORIDE 0.9 % IV SOLN
INTRAVENOUS | Status: AC
  Administered 2014-02-16: 23:00:00 via INTRAVENOUS

## 2014-02-16 NOTE — Progress Notes (Signed)
  CARE MANAGEMENT ED NOTE 02/16/2014  Patient:  Ronald Ball, Ronald Ball   Account Number:  [id number]  Date Initiated:  02/16/2014  Documentation initiated by:  Radford Pax  Subjective/Objective Assessment:   Patient presents to ED post fall with injury to left hip.     Subjective/Objective Assessment Detail:   Left hip xray:  Findings most consistent with left intertrochanteric femur fracture     Action/Plan:   Action/Plan Detail:   Anticipated DC Date:       Status Recommendation to Physician:   Result of Recommendation:    Other ED Services  Consult Working Plan    DC Planning Services  CM consult  Other    Choice offered to / List presented to:  C-3 Spouse          Status of service:  Completed, signed off  ED Comments:   ED Comments Detail:  EDCM spoke to patient and his family at bedside.  Patient's wife Ronald Ball at bedside.  Patient is very hhard of hearing. Patient lives at home with his wife.  Patient has a walker, cane, crutches, wheelchair and shower chair at home. Patient reports up untill now, he was able to perform his own ADL's without difficulty.  Patient does nto have any home health services currently.  EDCM provided patient with a list of home health agencies in [facility].  EDCM explained to patient and his family that with home health, the patient may receive a visiting RN, PT, OT, aide and Child psychotherapist.  Patient and patient's family thankful for services.  No further EDCM needs at this time.

## 2014-02-16 NOTE — ED Provider Notes (Signed)
Medical screening examination/treatment/procedure(s) were conducted as a shared visit with non-physician practitioner(s) and myself.  I personally evaluated the patient during the encounter.   EKG Interpretation None      80 yo male presenting after a mechanical fall. Denies head trauma. Complains only of severe pain in left hip, worse when he moves his hip better when it is still.  On exam, well appearing, nontoxic, not distressed, normal respiratory effort, normal perfusion, head atraumatic, neck nontender with good range of motion, left elbow with skin care but minimal tenderness and full range of motion without pain, left hip tender to palpation without obvious deformity, painful range of motion.  Plan hip xrays.    admit  Clinical Impression: 1. Intertrochanteric fracture   2. Chronic back pain   3. Fall at home, subsequent encounter   4. Hypoxia   5. Pleural effusion on left       Candyce Churn III, MD 02/16/14 [phone]

## 2014-02-16 NOTE — H&P (Signed)
PCP:   Eustaquio Boyden, MD   Chief Complaint:  fall  HPI: 80 yo male has had some back issues lately and is wearing a back brace.  Today when he went to take the brace off, it fell to his feet and he tripped and fell on it hurting his left hip.  No loc.  In normal state of health prior to this accident.  Lives at home with his wife.  Also of note, pt has had a large left pleural effusion dating back to [place] of this year which he has not wanted worked up.  Today cxr was checked as he was hypoxic, and it shows a significant persistent left pleural effusion.  Review of Systems:  Positive and negative as per HPI otherwise all other systems are negative  Past Medical History: Past Medical History  Diagnosis Date  . BPH (benign prostatic hyperplasia)   . Diverticulosis of colon   . History of barium enema 07/1991    Diverticuli  . History of CT scan of abdomen 08/14/05    abd/pelvis with and without R adrenal adenoma-stable  . Hyperlipidemia   . Glaucoma   . Hypertension   . Chronic back pain 2007    MRI scan of 03/15/06 revealed severe central canal stenosis at L4-5. He had extensive facet hypertrophy bilaterally. s/p ESI  . Constipation 11/2013    ER visit with miralax treatment  . Diabetes mellitus     02/16/14 - family denies   Past Surgical History  Procedure Laterality Date  . Prostate biopsy  07/13/98    benign, cytoscopy wnl  . Shoulder surgery Right 04/03    [facility]  . Cataract extraction Left 04/01  . Cataract extraction Right 01/04     Medications: Prior to Admission medications   Medication Sig Start Date End Date Taking? Authorizing Provider  acetaminophen (TYLENOL) 500 MG tablet Take 500-1,000 mg by mouth daily as needed for mild pain.   Yes Historical Provider, MD  aspirin EC 81 MG tablet Take 81 mg by mouth daily.   Yes Historical Provider, MD  Carboxymethylcellulose Sodium (REFRESH PLUS OP) Apply 1 drop to eye 3 (three) times daily as needed (dry eyes).  Both eyes   Yes Historical Provider, MD  latanoprost (XALATAN) 0.005 % ophthalmic solution Place 1 drop into the right eye at bedtime.    Yes Historical Provider, MD  lisinopril-hydrochlorothiazide (PRINZIDE,ZESTORETIC) 10-12.5 MG per tablet Take 1 tablet by mouth daily.   Yes Historical Provider, MD  magnesium hydroxide (MILK OF MAGNESIA) 400 MG/5ML suspension Take 45 mLs by mouth daily as needed for mild constipation.   Yes Historical Provider, MD  timolol (TIMOPTIC) 0.5 % ophthalmic solution Place 1 drop into both eyes 2 (two) times daily.     Yes Historical Provider, MD    Allergies:   Allergies  Allergen Reactions  . Atenolol     REACTION: ? Side effect  . Esomeprazole Magnesium     REACTION: abd swelling  . Ofloxacin     REACTION: itching    Social History:  reports that he has never smoked. He has never used smokeless tobacco. He reports that he does not drink alcohol or use illicit drugs.  Family History: Family History  Problem Relation Age of Onset  . Cancer Mother     stomach  and colon  . Cancer Brother     possible lung cancer    Physical Exam: Filed Vitals:   02/16/14 1721 02/16/14 1724 02/16/14 1954  BP:  141/69  136/71  Pulse:   77  Temp: 98.6 F (37 C)  97.6 F (36.4 C)  TempSrc:   Oral  Resp: 20  18  SpO2: 87% 92% 97%   General appearance: alert, cooperative and no distress Head: Normocephalic, without obvious abnormality, atraumatic Eyes: negative Nose: Nares normal. Septum midline. Mucosa normal. No drainage or sinus tenderness. Neck: no JVD and supple, symmetrical, trachea midline Lungs: diminished breath sounds LLL and LUL Heart: regular rate and rhythm, S1, S2 normal, no murmur, click, rub or gallop Abdomen: soft, non-tender; bowel sounds normal; no masses,  no organomegaly Extremities: extremities normal, atraumatic, no cyanosis or edema Pulses: 2+ and symmetric Skin: Skin color, texture, turgor normal. No rashes or lesions Neurologic:  Grossly normal  Labs on Admission:   Recent Labs  02/16/14 1804  NA 135*  K 4.1  CL 93*  CO2 30  GLUCOSE 135*  BUN 22  CREATININE 1.05  CALCIUM 9.4    Recent Labs  02/16/14 1804  WBC 12.2*  NEUTROABS 9.5*  HGB 14.1  HCT 43.5  MCV 90.2  PLT 187   Radiological Exams on Admission: Dg Hip Complete Left  02/16/2014   CLINICAL DATA:  Fall, left hip pain  EXAM: LEFT HIP - COMPLETE 2+ VIEW  COMPARISON:  02/13/2013  FINDINGS: Hips are located.  No pelvic fracture sacral fracture.  There is a curvilinear lucency extending from the greater trochanter towards the lesser trochanter of the left hip. This persists on 2 images. The hip is internally rotated on both images which limits evaluation.  IMPRESSION: Findings most consistent with left intertrochanteric femur fracture.   Electronically Signed   By: Genevive Bi M.D.   On: 02/16/2014 19:10    Assessment/Plan  80 yo male with mechanical fall and left hip fracture with also large left pleural effusion for the last 6 months  Principal Problem:   Hip fracture, left-  Hip pathway.  Keep npo overnight.  Ortho has been called and will see in am.  Pt will need this effusion addressed in am before surgery in order to optimize his recovery and perioperative experience.  Active Problems:   DIABETES MELLITUS, TYPE II   HYPERTENSION   Chronic back pain   Fall at home   Intertrochanteric fracture   Pleural effusion on left-  Will need to arrange at least thoracentesis prior to surgery in am.  Present for 6 months, no fevers at home.  He has been tolerating this well chronically at home.  And has refused w/u thus far.  cxr was added on and due to his findings, his bed was changed from med surg to tele monitoring.   Hypoxia  Admit to tele.  DNR.  He wants surgery only if it will help which i believe will, once his large effusion is addressed.  Tnya Ades A 02/16/2014, 8:58 PM

## 2014-02-16 NOTE — ED Notes (Signed)
Pt returned from xray

## 2014-02-16 NOTE — ED Provider Notes (Signed)
CSN: [id number]     Arrival date & time 02/16/14  1714 History   First MD Initiated Contact with Patient 02/16/14 1719     Chief Complaint  Patient presents with  . Fall  . Hip Injury     (Consider location/radiation/quality/duration/timing/severity/associated sxs/prior Treatment) HPI 80 year old male with history of osteoarthritis and back pain presents by EMS after a fall at home today. States he has been wearing a back brace for the chronic back pain, and while taking the brace off today it slipped down his legs and trapped his feet. He lost his balance and fell onto the left hip and elbow, and reports left hip pain since. Pain is localized to left lateral hip, worse with movement. Have not attempted to ambulate. Denies any loss of sensation in left lower extremity, knee or ankle pain. Also endorses mild left elbow pain and ecchymosis since the fall, wound care by EMS at the site. Denies any head trauma during the fall or any headache, changes in vision, weakness or loss of sensation, chest pain, SOB, or lightheadedness.  Past Medical History  Diagnosis Date  . BPH (benign prostatic hyperplasia)   . Diverticulosis of colon   . History of barium enema 07/1991    Diverticuli  . History of CT scan of abdomen 08/14/05    abd/pelvis with and without R adrenal adenoma-stable  . Diabetes mellitus   . Hyperlipidemia   . Glaucoma   . Hypertension   . Chronic back pain 2007    MRI scan of 03/15/06 revealed severe central canal stenosis at L4-5. He had extensive facet hypertrophy bilaterally. s/p ESI  . Constipation 11/2013    ER visit with miralax treatment   Past Surgical History  Procedure Laterality Date  . Prostate biopsy  07/13/98    benign, cytoscopy wnl  . Shoulder surgery Right 04/03    [facility]  . Cataract extraction Left 04/01  . Cataract extraction Right 01/04    Family History  Problem Relation Age of Onset  . Cancer Mother     stomach  and colon  . Cancer Brother    possible lung cancer   History  Substance Use Topics  . Smoking status: Never Smoker   . Smokeless tobacco: Never Used  . Alcohol Use: No    Review of Systems All other systems negative except as documented in the HPI. All pertinent positives and negatives as reviewed in the HPI.   Allergies  Atenolol; Esomeprazole magnesium; and Ofloxacin  Home Medications   Prior to Admission medications   Medication Sig Start Date End Date Taking? Authorizing Provider  acetaminophen (TYLENOL) 500 MG tablet Take 500-1,000 mg by mouth daily as needed for mild pain.   Yes Historical Provider, MD  aspirin EC 81 MG tablet Take 81 mg by mouth daily.   Yes Historical Provider, MD  Carboxymethylcellulose Sodium (REFRESH PLUS OP) Apply 1 drop to eye 3 (three) times daily as needed (dry eyes). Both eyes   Yes Historical Provider, MD  latanoprost (XALATAN) 0.005 % ophthalmic solution Place 1 drop into the right eye at bedtime.    Yes Historical Provider, MD  lisinopril-hydrochlorothiazide (PRINZIDE,ZESTORETIC) 10-12.5 MG per tablet Take 1 tablet by mouth daily.   Yes Historical Provider, MD  magnesium hydroxide (MILK OF MAGNESIA) 400 MG/5ML suspension Take 45 mLs by mouth daily as needed for mild constipation.   Yes Historical Provider, MD  timolol (TIMOPTIC) 0.5 % ophthalmic solution Place 1 drop into both eyes 2 (two)  times daily.     Yes Historical Provider, MD   BP 141/69  Temp(Src) 98.6 F (37 C)  Resp 20  SpO2 92% Physical Exam  Nursing note and vitals reviewed. Constitutional: He is oriented to person, place, and time. He appears well-developed and well-nourished. No distress.  HENT:  Head: Normocephalic and atraumatic.  Eyes: EOM are normal. Pupils are equal, round, and reactive to light.  Neck: Neck supple.  Cardiovascular: Normal rate, regular rhythm and normal heart sounds.  Exam reveals no gallop and no friction rub.   No murmur heard. Pulses:      Radial pulses are 2+ on the right  side, and 2+ on the left side.       Dorsalis pedis pulses are 2+ on the right side, and 2+ on the left side.       Posterior tibial pulses are 2+ on the right side, and 2+ on the left side.  Pulmonary/Chest: Effort normal and breath sounds normal. No respiratory distress.  Musculoskeletal:       Left elbow: He exhibits laceration. He exhibits normal range of motion, no swelling and no deformity.       Left hip: He exhibits decreased range of motion and tenderness. He exhibits no swelling, no deformity and no laceration.       Arms:      Legs: Neurological: He is alert and oriented to person, place, and time. No cranial nerve deficit or sensory deficit.  Intact sensation upper and lower extremities bilaterally.   Skin: Skin is warm and dry.    ED Course  Procedures (including critical care time) Labs Review Labs Reviewed  CBC WITH DIFFERENTIAL  BASIC METABOLIC PANEL  URINALYSIS, ROUTINE W REFLEX MICROSCOPIC    Imaging Review Dg Chest 1 View  02/17/2014   CLINICAL DATA:  Status post left thoracentesis.  EXAM: CHEST - 1 VIEW  COMPARISON:  02/16/2014  FINDINGS: Cardiac silhouette remains largely obscured. There is persistent complete opacification of the left hemithorax. Rightward mediastinal shift may have minimally improved, although direct comparison is limited by patient rotation on the prior study. Pulmonary vascular congestion in the right lung has mildly improved. No right lung consolidation is seen. No pneumothorax is identified.  IMPRESSION: Persistent complete opacification of the left hemithorax, consistent with large pleural effusion. Rightward mediastinal shift may have slightly improved following thoracentesis. No pneumothorax identified.   Electronically Signed   By: Sebastian Ache   On: 02/17/2014 12:17   Dg Chest 1 View  02/16/2014   CLINICAL DATA:  Fall.  Hip fracture.  EXAM: CHEST - 1 VIEW  COMPARISON:  12/08/2013  FINDINGS: Complete opacification of the left hemi thorax is  without change. This is likely a large effusion. There is no evidence of volume loss. Mediastinal structures are mildly displaced to the right supporting large pleural effusion.  Increased vascularity is noted in the right lung accentuated by the supine technique. No right lung consolidation or overt edema.  Bony thorax is extensively demineralized.  IMPRESSION: No significant change from the prior study. Large left pleural effusion opacifies the left hemi thorax. No convincing pulmonary edema or infiltrate in the right lung.   Electronically Signed   By: Amie Portland M.D.   On: 02/16/2014 20:44   Dg Hip Complete Left  02/16/2014   CLINICAL DATA:  Fall, left hip pain  EXAM: LEFT HIP - COMPLETE 2+ VIEW  COMPARISON:  02/13/2013  FINDINGS: Hips are located.  No pelvic fracture sacral fracture.  There is a curvilinear lucency extending from the greater trochanter towards the lesser trochanter of the left hip. This persists on 2 images. The hip is internally rotated on both images which limits evaluation.  IMPRESSION: Findings most consistent with left intertrochanteric femur fracture.   Electronically Signed   By: Genevive Bi M.D.   On: 02/16/2014 19:10   Dg Hip Operative Left  02/17/2014   CLINICAL DATA:  ORIF of intertrochanteric left femoral neck fracture.  EXAM: OPERATIVE LEFT HIP 1 VIEW  COMPARISON:  Preoperative left hip x-rays yesterday.  FINDINGS: Single spot image from the C-arm fluoroscopic device, AP image of the left hip is submitted for interpretation post operatively. Patient has undergone ORIF of the old intertrochanteric left femoral neck fracture with placement of and answer medullary nail into the femur and a compression screw across fracture. Alignment appears anatomic.  IMPRESSION: Anatomic alignment in the AP projection post ORIF of the intertrochanteric left femoral neck fracture.   Electronically Signed   By: Hulan Saas M.D.   On: 02/17/2014 14:11   US Thoracentesis Asp Pleural  Space W/img Guide  02/17/2014   CLINICAL DATA:  Large left pleural effusion  EXAM: ULTRASOUND GUIDED left THORACENTESIS  COMPARISON:  None  PROCEDURE: An ultrasound guided thoracentesis was thoroughly discussed with the patient and questions answered. The benefits, risks, alternatives and complications were also discussed. The patient understands and wishes to proceed with the procedure. Written consent was obtained.  Ultrasound was performed to localize and mark an adequate pocket of fluid in the left chest. The area was then prepped and draped in the normal sterile fashion. 1% Lidocaine was used for local anesthesia. Under ultrasound guidance a 19 gauge Yueh catheter was introduced. Thoracentesis was performed. The catheter was removed and a dressing applied.  Complications:  None  FINDINGS: A total of approximately 1.3 L of yellow fluid was removed. A fluid sample wassent for laboratory analysis.  IMPRESSION: Successful ultrasound guided left thoracentesis yielding 1.3 L of pleural fluid.  Read by:  Robet Leu PAC-IR   Electronically Signed   By: Simonne Come M.D.   On: 02/17/2014 13:28     EKG Interpretation   Date/Time:  Friday February 16 2014 17:23:16 EDT Ventricular Rate:  80 PR Interval:    QRS Duration: 97 QT Interval:  418 QTC Calculation: 482 R Axis:   57 Text Interpretation:  Sinus rhythm Low voltage, extremity and precordial  leads Borderline prolonged QT interval Baseline wander in lead(s) V5 No  old tracing to compare Confirmed by [facility]  MD, TREY ([id number]) on 02/16/2014  11:34:30 PM      The patient will be admitted to the hospital for further eval and care of his hip injury and L sided pulmonary issues.     Carlyle Dolly, PA-C 02/18/14 1749

## 2014-02-16 NOTE — ED Notes (Signed)
Patient transported to X-ray 

## 2014-02-16 NOTE — Progress Notes (Signed)
Clinical Social Work Department BRIEF PSYCHOSOCIAL ASSESSMENT 02/16/2014  Patient:  Ronald Ball, Ronald Ball     Account Number:  [id number]     Admit date:  02/16/2014  Clinical Social Worker:  Luretha Rued  Date/Time:  02/16/2014 07:30 PM  Referred by:  CSW  Date Referred:  02/16/2014  Other Referral:   Interview type:  Patient Other interview type:   Wife and son at bedside    PSYCHOSOCIAL DATA Living Status:  WIFE Admitted from facility:   Level of care:   Primary support name:  Tiffany Kocher Primary support relationship to patient:  SPOUSE Degree of support available:   high level of support    CURRENT CONCERNS  Other Concerns:    SOCIAL WORK ASSESSMENT / PLAN CSW met with patient and family at bedside.  Patient presents as alert, oriented x3, calm, and cooperative. Patient requested CSW to speak with wife about any plans because he did not have his hearing aides at this time.  He reports when they are in his ear while is laying in the bed they make a loud noise so he took them out.  The wife and son are requesting some assistance with possible rehab after discharge from the hospital.  They report that the children of the patient will assist at home once her returns.  CSW provided the family with [facility] resource list and encourged them to invesgate some facilities.  The reports that the patient has a living will and she is the sole person on it.   Assessment/plan status:  Psychosocial Support/Ongoing Assessment of Needs Other assessment/ plan:   Information/referral to community resources:   SNF    PATIENT'S/FAMILY'S RESPONSE TO PLAN OF CARE: Patient and Family expressed their appreciation of the support from the social work department.      Chesley Noon, MSW, [place], 02/16/2014 Evening Clinical Social Worker [phone]

## 2014-02-16 NOTE — ED Notes (Signed)
Per EMS pt coming from home with c/o fall and left hip pain. Per EMS pt sts he tripped and fell landing on his left hip;pt denies hitting his head, no LOC, EMS reports outward rotation of left leg.

## 2014-02-17 ENCOUNTER — Inpatient Hospital Stay (HOSPITAL_COMMUNITY): Payer: Medicare PPO

## 2014-02-17 ENCOUNTER — Inpatient Hospital Stay (HOSPITAL_COMMUNITY): Payer: Medicare PPO | Admitting: Anesthesiology

## 2014-02-17 ENCOUNTER — Encounter (HOSPITAL_COMMUNITY)
Admission: EM | Disposition: A | Discharge status: Skilled Nursing Facility | Payer: Self-pay | Source: Home / Self Care | Attending: Internal Medicine

## 2014-02-17 ENCOUNTER — Encounter (HOSPITAL_COMMUNITY): Payer: Medicare PPO | Admitting: Anesthesiology

## 2014-02-17 DIAGNOSIS — M48061 Spinal stenosis, lumbar region without neurogenic claudication: Secondary | ICD-10-CM

## 2014-02-17 DIAGNOSIS — E119 Type 2 diabetes mellitus without complications: Secondary | ICD-10-CM

## 2014-02-17 HISTORY — PX: INTRAMEDULLARY (IM) NAIL INTERTROCHANTERIC: SHX5875

## 2014-02-17 LAB — CBC
HEMATOCRIT: 43.9 % (ref 39.0–52.0)
Hemoglobin: 14 g/dL (ref 13.0–17.0)
MCH: 28.8 pg (ref 26.0–34.0)
MCHC: 31.9 g/dL (ref 30.0–36.0)
MCV: 90.3 fL (ref 78.0–100.0)
Platelets: 179 10*3/uL (ref 150–400)
RBC: 4.86 MIL/uL (ref 4.22–5.81)
RDW: 13.8 % (ref 11.5–15.5)
WBC: 15.1 10*3/uL — ABNORMAL HIGH (ref 4.0–10.5)

## 2014-02-17 LAB — URINALYSIS, ROUTINE W REFLEX MICROSCOPIC
BILIRUBIN URINE: NEGATIVE
Glucose, UA: NEGATIVE mg/dL
Hgb urine dipstick: NEGATIVE
Ketones, ur: 15 mg/dL — AB
Leukocytes, UA: NEGATIVE
NITRITE: NEGATIVE
PH: 6.5 (ref 5.0–8.0)
Protein, ur: 30 mg/dL — AB
SPECIFIC GRAVITY, URINE: 1.019 (ref 1.005–1.030)
Urobilinogen, UA: 0.2 mg/dL (ref 0.0–1.0)

## 2014-02-17 LAB — GLUCOSE, SEROUS FLUID: Glucose, Fluid: 29 mg/dL

## 2014-02-17 LAB — SURGICAL PCR SCREEN
MRSA, PCR: NEGATIVE
Staphylococcus aureus: NEGATIVE

## 2014-02-17 LAB — BASIC METABOLIC PANEL
Anion gap: 13 (ref 5–15)
BUN: 20 mg/dL (ref 6–23)
CALCIUM: 9.2 mg/dL (ref 8.4–10.5)
CO2: 29 mEq/L (ref 19–32)
Chloride: 95 mEq/L — ABNORMAL LOW (ref 96–112)
Creatinine, Ser: 0.92 mg/dL (ref 0.50–1.35)
GFR, EST AFRICAN AMERICAN: 79 mL/min — AB (ref >90)
GFR, EST NON AFRICAN AMERICAN: 68 mL/min — AB (ref >90)
Glucose, Bld: 138 mg/dL — ABNORMAL HIGH (ref 70–99)
POTASSIUM: 4.4 meq/L (ref 3.7–5.3)
SODIUM: 137 meq/L (ref 137–147)

## 2014-02-17 LAB — GLUCOSE, CAPILLARY
GLUCOSE-CAPILLARY: 105 mg/dL — AB (ref 70–99)
GLUCOSE-CAPILLARY: 117 mg/dL — AB (ref 70–99)
Glucose-Capillary: 112 mg/dL — ABNORMAL HIGH (ref 70–99)
Glucose-Capillary: 118 mg/dL — ABNORMAL HIGH (ref 70–99)
Glucose-Capillary: 131 mg/dL — ABNORMAL HIGH (ref 70–99)
Glucose-Capillary: 157 mg/dL — ABNORMAL HIGH (ref 70–99)

## 2014-02-17 LAB — HEPATIC FUNCTION PANEL
ALK PHOS: 92 U/L (ref 39–117)
ALT: 9 U/L (ref 0–53)
AST: 14 U/L (ref 0–37)
Albumin: 3.3 g/dL — ABNORMAL LOW (ref 3.5–5.2)
BILIRUBIN INDIRECT: 0.9 mg/dL (ref 0.3–0.9)
Bilirubin, Direct: 0.2 mg/dL (ref 0.0–0.3)
TOTAL PROTEIN: 6.6 g/dL (ref 6.0–8.3)
Total Bilirubin: 1.1 mg/dL (ref 0.3–1.2)

## 2014-02-17 LAB — LACTATE DEHYDROGENASE: LDH: 159 U/L (ref 94–250)

## 2014-02-17 LAB — URINE MICROSCOPIC-ADD ON

## 2014-02-17 LAB — BODY FLUID CELL COUNT WITH DIFFERENTIAL: WBC FLUID: 12 uL (ref 0–1000)

## 2014-02-17 LAB — LACTATE DEHYDROGENASE, PLEURAL OR PERITONEAL FLUID: LD FL: 287 U/L — AB (ref 3–23)

## 2014-02-17 LAB — PROTEIN, BODY FLUID: Total protein, fluid: 4.2 g/dL

## 2014-02-17 SURGERY — FIXATION, FRACTURE, INTERTROCHANTERIC, WITH INTRAMEDULLARY ROD
Anesthesia: General | Site: Hip | Laterality: Left

## 2014-02-17 MED ORDER — PROPOFOL 10 MG/ML IV BOLUS
INTRAVENOUS | Status: DC | PRN
  Administered 2014-02-17: 90 mg via INTRAVENOUS

## 2014-02-17 MED ORDER — PHENOL 1.4 % MT LIQD
1.0000 | OROMUCOSAL | Status: DC | PRN
  Filled 2014-02-17: qty 177

## 2014-02-17 MED ORDER — ONDANSETRON HCL 4 MG PO TABS: 4.0000 mg | ORAL_TABLET | Freq: Four times a day (QID) | ORAL | Status: DC | PRN

## 2014-02-17 MED ORDER — CHLORHEXIDINE GLUCONATE 4 % EX LIQD
60.0000 mL | Freq: Once | CUTANEOUS | Status: AC
  Administered 2014-02-17: 4 via TOPICAL
  Filled 2014-02-17: qty 60

## 2014-02-17 MED ORDER — LACTATED RINGERS IV SOLN: INTRAVENOUS | Status: DC

## 2014-02-17 MED ORDER — INSULIN ASPART 100 UNIT/ML ~~LOC~~ SOLN
0.0000 [IU] | SUBCUTANEOUS | Status: DC
  Administered 2014-02-17 – 2014-02-19 (×7): 1 [IU] via SUBCUTANEOUS
  Administered 2014-02-19: 7 [IU] via SUBCUTANEOUS

## 2014-02-17 MED ORDER — 0.9 % SODIUM CHLORIDE (POUR BTL) OPTIME
TOPICAL | Status: DC | PRN
  Administered 2014-02-17: 1000 mL

## 2014-02-17 MED ORDER — CEFAZOLIN SODIUM-DEXTROSE 2-3 GM-% IV SOLR
2.0000 g | INTRAVENOUS | Status: AC
  Administered 2014-02-17: 2 g via INTRAVENOUS

## 2014-02-17 MED ORDER — PROPOFOL 10 MG/ML IV BOLUS
INTRAVENOUS | Status: AC
  Filled 2014-02-17: qty 20

## 2014-02-17 MED ORDER — ASPIRIN EC 325 MG PO TBEC
325.0000 mg | DELAYED_RELEASE_TABLET | Freq: Every day | ORAL | Status: DC
  Administered 2014-02-18 – 2014-02-21 (×4): 325 mg via ORAL
  Filled 2014-02-17 (×6): qty 1

## 2014-02-17 MED ORDER — ONDANSETRON 4 MG PO TBDP
4.0000 mg | ORAL_TABLET | Freq: Three times a day (TID) | ORAL | Status: DC | PRN
  Filled 2014-02-17: qty 1

## 2014-02-17 MED ORDER — SODIUM CHLORIDE 0.9 % IV SOLN
INTRAVENOUS | Status: DC
  Administered 2014-02-17: 17:00:00 via INTRAVENOUS

## 2014-02-17 MED ORDER — MENTHOL 3 MG MT LOZG
1.0000 | LOZENGE | OROMUCOSAL | Status: DC | PRN
  Filled 2014-02-17: qty 9

## 2014-02-17 MED ORDER — LACTATED RINGERS IV SOLN
INTRAVENOUS | Status: DC | PRN
  Administered 2014-02-17: 13:00:00 via INTRAVENOUS

## 2014-02-17 MED ORDER — CETYLPYRIDINIUM CHLORIDE 0.05 % MT LIQD: 7.0000 mL | Freq: Two times a day (BID) | OROMUCOSAL | Status: DC

## 2014-02-17 MED ORDER — ACETAMINOPHEN 650 MG RE SUPP: 650.0000 mg | Freq: Four times a day (QID) | RECTAL | Status: DC | PRN

## 2014-02-17 MED ORDER — METOCLOPRAMIDE HCL 10 MG PO TABS: 5.0000 mg | ORAL_TABLET | Freq: Three times a day (TID) | ORAL | Status: DC | PRN

## 2014-02-17 MED ORDER — ACETAMINOPHEN 325 MG PO TABS: 650.0000 mg | ORAL_TABLET | Freq: Four times a day (QID) | ORAL | Status: DC | PRN

## 2014-02-17 MED ORDER — ONDANSETRON HCL 4 MG/2ML IJ SOLN: 4.0000 mg | Freq: Three times a day (TID) | INTRAMUSCULAR | Status: DC | PRN

## 2014-02-17 MED ORDER — FENTANYL CITRATE 0.05 MG/ML IJ SOLN
INTRAMUSCULAR | Status: AC
  Filled 2014-02-17: qty 5

## 2014-02-17 MED ORDER — ONDANSETRON HCL 4 MG/2ML IJ SOLN
4.0000 mg | Freq: Four times a day (QID) | INTRAMUSCULAR | Status: DC | PRN
Start: 2014-02-17 — End: 2014-02-21

## 2014-02-17 MED ORDER — LIDOCAINE HCL (PF) 2 % IJ SOLN
INTRAMUSCULAR | Status: DC | PRN
  Administered 2014-02-17: 50 mg via INTRADERMAL

## 2014-02-17 MED ORDER — HYDROMORPHONE HCL 1 MG/ML IJ SOLN: 0.2500 mg | INTRAMUSCULAR | Status: DC | PRN

## 2014-02-17 MED ORDER — CETYLPYRIDINIUM CHLORIDE 0.05 % MT LIQD
7.0000 mL | Freq: Two times a day (BID) | OROMUCOSAL | Status: DC
  Administered 2014-02-17 – 2014-02-21 (×8): 7 mL via OROMUCOSAL

## 2014-02-17 MED ORDER — LIDOCAINE HCL (CARDIAC) 20 MG/ML IV SOLN
INTRAVENOUS | Status: AC
  Filled 2014-02-17: qty 5

## 2014-02-17 MED ORDER — ONDANSETRON HCL 4 MG/2ML IJ SOLN
INTRAMUSCULAR | Status: AC
  Filled 2014-02-17: qty 2

## 2014-02-17 MED ORDER — CEFAZOLIN SODIUM-DEXTROSE 2-3 GM-% IV SOLR
INTRAVENOUS | Status: AC
  Filled 2014-02-17: qty 50

## 2014-02-17 MED ORDER — HYDROCODONE-ACETAMINOPHEN 5-325 MG PO TABS
1.0000 | ORAL_TABLET | Freq: Four times a day (QID) | ORAL | Status: DC | PRN
  Administered 2014-02-17 – 2014-02-18 (×2): 1 via ORAL
  Administered 2014-02-18 – 2014-02-19 (×2): 2 via ORAL
  Administered 2014-02-19 – 2014-02-20 (×2): 1 via ORAL
  Filled 2014-02-17 (×4): qty 1
  Filled 2014-02-17 (×2): qty 2

## 2014-02-17 MED ORDER — ONDANSETRON HCL 4 MG/2ML IJ SOLN
INTRAMUSCULAR | Status: DC | PRN
  Administered 2014-02-17: 4 mg via INTRAVENOUS

## 2014-02-17 MED ORDER — FENTANYL CITRATE 0.05 MG/ML IJ SOLN
INTRAMUSCULAR | Status: DC | PRN
  Administered 2014-02-17: 100 ug via INTRAVENOUS
  Administered 2014-02-17: 50 ug via INTRAVENOUS

## 2014-02-17 MED ORDER — PHENYLEPHRINE 40 MCG/ML (10ML) SYRINGE FOR IV PUSH (FOR BLOOD PRESSURE SUPPORT)
PREFILLED_SYRINGE | INTRAVENOUS | Status: AC
  Filled 2014-02-17: qty 10

## 2014-02-17 MED ORDER — SUCCINYLCHOLINE CHLORIDE 20 MG/ML IJ SOLN
INTRAMUSCULAR | Status: DC | PRN
  Administered 2014-02-17: 100 mg via INTRAVENOUS

## 2014-02-17 MED ORDER — METOCLOPRAMIDE HCL 5 MG/ML IJ SOLN: 5.0000 mg | Freq: Three times a day (TID) | INTRAMUSCULAR | Status: DC | PRN

## 2014-02-17 MED ORDER — PHENYLEPHRINE HCL 10 MG/ML IJ SOLN
INTRAMUSCULAR | Status: DC | PRN
  Administered 2014-02-17 (×3): 80 ug via INTRAVENOUS

## 2014-02-17 MED ORDER — CEFAZOLIN SODIUM-DEXTROSE 2-3 GM-% IV SOLR
2.0000 g | Freq: Four times a day (QID) | INTRAVENOUS | Status: AC
  Administered 2014-02-17 – 2014-02-18 (×2): 2 g via INTRAVENOUS
  Filled 2014-02-17 (×2): qty 50

## 2014-02-17 MED ORDER — ACETAMINOPHEN 500 MG PO TABS: 500.0000 mg | ORAL_TABLET | Freq: Four times a day (QID) | ORAL | Status: DC | PRN

## 2014-02-17 SURGICAL SUPPLY — 24 items
BIT DRILL CALIBRATED 4.2 (BIT) IMPLANT
BIT DRILL CANN 16 HIP (BIT) ×1 IMPLANT
BIT DRILL CANN 16MM HIP (BIT) ×1
BIT DRILL TAPERED 10 (BIT) ×1 IMPLANT
BIT DRILL TAPERED 10MM (BIT) ×1
BLADE TFNA HELICAL 100 STRL (Anchor) ×2 IMPLANT
BNDG GAUZE ELAST 4 BULKY (GAUZE/BANDAGES/DRESSINGS) ×2 IMPLANT
DRAPE STERI IOBAN 125X83 (DRAPES) ×2 IMPLANT
DRILL BIT CALIBRATED 4.2 (BIT) ×3
DRSG MEPILEX BORDER 4X4 (GAUZE/BANDAGES/DRESSINGS) ×4 IMPLANT
GLOVE SURG ORTHO 9.0 STRL STRW (GLOVE) ×2 IMPLANT
GOWN STRL REUS W/ TWL LRG LVL3 (GOWN DISPOSABLE) IMPLANT
GOWN STRL REUS W/ TWL XL LVL3 (GOWN DISPOSABLE) IMPLANT
GOWN STRL REUS W/TWL LRG LVL3 (GOWN DISPOSABLE) ×3
GOWN STRL REUS W/TWL XL LVL3 (GOWN DISPOSABLE) ×3
GUIDEWIRE 3.2X400 (WIRE) ×4 IMPLANT
IMPL DEG TI CANN 11MM/130 (Orthopedic Implant) IMPLANT
IMPLANT DEG TI CANN 11MM/130 (Orthopedic Implant) ×3 IMPLANT
KIT BASIN OR (CUSTOM PROCEDURE TRAY) ×2 IMPLANT
PACK GENERAL/GYN (CUSTOM PROCEDURE TRAY) ×2 IMPLANT
POSITIONER SURGICAL ARM (MISCELLANEOUS) ×4 IMPLANT
SCREW LOCK TI 5.0X34 F/IM NAIL (Screw) ×2 IMPLANT
TOWEL OR 17X26 10 PK STRL BLUE (TOWEL DISPOSABLE) ×2 IMPLANT
TOWEL OR NON WOVEN STRL DISP B (DISPOSABLE) ×2 IMPLANT

## 2014-02-17 NOTE — Progress Notes (Signed)
Pt post thoracentesis, had 1.3L removed. O2 sat 90-94%on 2lnc. Awake, A&Ox4. Per Dr Tat pt clear for surgery. To OR at this time.

## 2014-02-17 NOTE — Procedures (Signed)
  US guided L thora 1.3 liters yellow fluid Sent for labs per MD tolerated well  cxr pending

## 2014-02-17 NOTE — Anesthesia Preprocedure Evaluation (Addendum)
Anesthesia Evaluation  Patient identified by MRN, date of birth, ID band Patient awake    Reviewed: Allergy & Precautions, H&P , NPO status , Patient's Chart, lab work & pertinent test results  Airway Mallampati: II TM Distance: >3 FB Neck ROM: full    Dental  (+) Poor Dentition, Dental Advisory Given   Pulmonary neg pulmonary ROS,  Pleural effusion left breath sounds clear to auscultation  Pulmonary exam normal       Cardiovascular Exercise Tolerance: Good hypertension, Pt. on medications negative cardio ROS  Rhythm:regular Rate:Normal  borderline prolonged QT   Neuro/Psych Severe lumbar stenosis. L1 vertebral fracture. Glaucoma. vertigo negative neurological ROS  negative psych ROS   GI/Hepatic negative GI ROS, Neg liver ROS,   Endo/Other  negative endocrine ROSdiabetesAdrenal adenoma  Renal/GU negative Renal ROS  negative genitourinary   Musculoskeletal   Abdominal   Peds  Hematology negative hematology ROS (+)   Anesthesia Other Findings   Reproductive/Obstetrics negative OB ROS                          Anesthesia Physical Anesthesia Plan  ASA: III  Anesthesia Plan: General   Post-op Pain Management:    Induction: Intravenous  Airway Management Planned: Oral ETT  Additional Equipment:   Intra-op Plan:   Post-operative Plan: Extubation in OR  Informed Consent: I have reviewed the patients History and Physical, chart, labs and discussed the procedure including the risks, benefits and alternatives for the proposed anesthesia with the patient or authorized representative who has indicated his/her understanding and acceptance.   Dental Advisory Given  Plan Discussed with: CRNA and Surgeon  Anesthesia Plan Comments:         Anesthesia Quick Evaluation

## 2014-02-17 NOTE — Op Note (Signed)
02/16/2014 - 02/17/2014  2:27 PM  PATIENT:  Ronald Ball    PRE-OPERATIVE DIAGNOSIS:  LEFT INTERTROCHANTERIC FRACTURE  POST-OPERATIVE DIAGNOSIS:  Same  PROCEDURE:  INTRAMEDULLARY (IM) NAIL INTERTROCHANTRIC  SURGEON:  Nadara Mustard, MD  PHYSICIAN ASSISTANT:None ANESTHESIA:   General  PREOPERATIVE INDICATIONS:  Ronald Ball is a  80 y.o. male with a diagnosis of LEFT INTERTROCHANTERIC FRACTURE who failed conservative measures and elected for surgical management.    The risks benefits and alternatives were discussed with the patient preoperatively including but not limited to the risks of infection, bleeding, nerve injury, cardiopulmonary complications, the need for revision surgery, among others, and the patient was willing to proceed.  OPERATIVE IMPLANTS: Synthes trochanteric nail. 11 x 170 mm x 130. 34 mm interlocking screw. 105 mm barrel.  OPERATIVE FINDINGS: Stable construct  OPERATIVE PROCEDURE: Patient is a 80 year old gentleman who had a mechanical fall patient had no other injuries other than a few abrasions. Patient was evaluated and felt to be medically stable for surgical intervention. Family and patient were discussed the risks and benefits of surgery they wish to proceed at this time.  Patient was brought to the operating room and underwent a general anesthetic. After adequate levels of anesthesia were obtained patient's left lower extremity was prepped using DuraPrep draped into a sterile field using the shower curtain. Patient was in boot traction and the left dorsal lithotomy position on the right. A timeout was called. An incision was made just proximal to the greater trochanter. A guidewire was inserted down the shaft and C-arm fluoroscopy verified alignment of the guidewire. This was overreamed and the implant was inserted. Using the guidewire this was inserted center center in the femoral head this measured 95 mm and a 95 mm screw was inserted. This was  locked proximally. Distally the guide was used to insert a 34 mm locking screw. C-arm fluoroscopy verified alignment of both AP and lateral planes. The wounds were irrigated with normal saline. The subcutaneous is closed using 0 Vicryl the skin was closed using staples. A placed dressings were applied. Patient was extubated taken to the PACU in stable condition.

## 2014-02-17 NOTE — Progress Notes (Signed)
Pt transported from ED to 4th floor via stretcher and moved over to bed with 4 assist. VSS at this time. Pt c/o pain only when moving LLE. Wife, son at bedside. No distress noted. Will continue to monitor pt and carry out POC. Ronald Ball

## 2014-02-17 NOTE — Consult Note (Signed)
Reason for Consult: Left intertrochanteric hip fracture Referring Physician: Dr. Domenick Bookbinder is an 80 y.o. male.  HPI: Patient is a 80 year old gentleman who had a mechanical fall onto his left hip. Patient denies any other injuries.  Past Medical History  Diagnosis Date  . BPH (benign prostatic hyperplasia)   . Diverticulosis of colon   . History of barium enema 07/1991    Diverticuli  . History of CT scan of abdomen 08/14/05    abd/pelvis with and without R adrenal adenoma-stable  . Hyperlipidemia   . Glaucoma   . Hypertension   . Chronic back pain 2007    MRI scan of 03/15/06 revealed severe central canal stenosis at L4-5. He had extensive facet hypertrophy bilaterally. s/p ESI  . Constipation 11/2013    ER visit with miralax treatment  . Diabetes mellitus     02/16/14 - family denies    Past Surgical History  Procedure Laterality Date  . Prostate biopsy  07/13/98    benign, cytoscopy wnl  . Shoulder surgery Right 04/03    [facility]  . Cataract extraction Left 04/01  . Cataract extraction Right 01/04     Family History  Problem Relation Age of Onset  . Cancer Mother     stomach  and colon  . Cancer Brother     possible lung cancer    Social History:  reports that he has never smoked. He has never used smokeless tobacco. He reports that he does not drink alcohol or use illicit drugs.  Allergies:  Allergies  Allergen Reactions  . Atenolol     REACTION: ? Side effect  . Esomeprazole Magnesium     REACTION: abd swelling  . Ofloxacin     REACTION: itching    Medications: I have reviewed the patient's current medications.  Results for orders placed during the hospital encounter of 02/16/14 (from the past 48 hour(s))  CBC WITH DIFFERENTIAL     Status: Abnormal   Collection Time    02/16/14  6:04 PM      Result Value Ref Range   WBC 12.2 (*) 4.0 - 10.5 K/uL   RBC 4.82  4.22 - 5.81 MIL/uL   Hemoglobin 14.1  13.0 - 17.0 g/dL   HCT 43.5  39.0 - 52.0  %   MCV 90.2  78.0 - 100.0 fL   MCH 29.3  26.0 - 34.0 pg   MCHC 32.4  30.0 - 36.0 g/dL   RDW 13.8  11.5 - 15.5 %   Platelets 187  150 - 400 K/uL   Neutrophils Relative % 77  43 - 77 %   Neutro Abs 9.5 (*) 1.7 - 7.7 K/uL   Lymphocytes Relative 12  12 - 46 %   Lymphs Abs 1.4  0.7 - 4.0 K/uL   Monocytes Relative 10  3 - 12 %   Monocytes Absolute 1.3 (*) 0.1 - 1.0 K/uL   Eosinophils Relative 1  0 - 5 %   Eosinophils Absolute 0.1  0.0 - 0.7 K/uL   Basophils Relative 0  0 - 1 %   Basophils Absolute 0.0  0.0 - 0.1 K/uL  BASIC METABOLIC PANEL     Status: Abnormal   Collection Time    02/16/14  6:04 PM      Result Value Ref Range   Sodium 135 (*) 137 - 147 mEq/L   Potassium 4.1  3.7 - 5.3 mEq/L   Chloride 93 (*) 96 - 112 mEq/L  CO2 30  19 - 32 mEq/L   Glucose, Bld 135 (*) 70 - 99 mg/dL   BUN 22  6 - 23 mg/dL   Creatinine, Ser 1.05  0.50 - 1.35 mg/dL   Calcium 9.4  8.4 - 10.5 mg/dL   GFR calc non Af Amer 57 (*) >90 mL/min   GFR calc Af Amer 66 (*) >90 mL/min   Comment: (NOTE)     The eGFR has been calculated using the CKD EPI equation.     This calculation has not been validated in all clinical situations.     eGFR's persistently <90 mL/min signify possible Chronic Kidney     Disease.   Anion gap 12  5 - 15  URINALYSIS, ROUTINE W REFLEX MICROSCOPIC     Status: Abnormal   Collection Time    02/17/14 12:22 AM      Result Value Ref Range   Color, Urine YELLOW  YELLOW   APPearance CLEAR  CLEAR   Specific Gravity, Urine 1.019  1.005 - 1.030   pH 6.5  5.0 - 8.0   Glucose, UA NEGATIVE  NEGATIVE mg/dL   Hgb urine dipstick NEGATIVE  NEGATIVE   Bilirubin Urine NEGATIVE  NEGATIVE   Ketones, ur 15 (*) NEGATIVE mg/dL   Protein, ur 30 (*) NEGATIVE mg/dL   Urobilinogen, UA 0.2  0.0 - 1.0 mg/dL   Nitrite NEGATIVE  NEGATIVE   Leukocytes, UA NEGATIVE  NEGATIVE  URINE MICROSCOPIC-ADD ON     Status: None   Collection Time    02/17/14 12:22 AM      Result Value Ref Range   RBC / HPF 0-2  <3  RBC/hpf   Urine-Other MUCOUS PRESENT    GLUCOSE, CAPILLARY     Status: Abnormal   Collection Time    02/17/14 12:50 AM      Result Value Ref Range   Glucose-Capillary 157 (*) 70 - 99 mg/dL  CBC     Status: Abnormal   Collection Time    02/17/14  5:29 AM      Result Value Ref Range   WBC 15.1 (*) 4.0 - 10.5 K/uL   RBC 4.86  4.22 - 5.81 MIL/uL   Hemoglobin 14.0  13.0 - 17.0 g/dL   HCT 43.9  39.0 - 52.0 %   MCV 90.3  78.0 - 100.0 fL   MCH 28.8  26.0 - 34.0 pg   MCHC 31.9  30.0 - 36.0 g/dL   RDW 13.8  11.5 - 15.5 %   Platelets 179  150 - 400 K/uL  BASIC METABOLIC PANEL     Status: Abnormal   Collection Time    02/17/14  5:29 AM      Result Value Ref Range   Sodium 137  137 - 147 mEq/L   Potassium 4.4  3.7 - 5.3 mEq/L   Chloride 95 (*) 96 - 112 mEq/L   CO2 29  19 - 32 mEq/L   Glucose, Bld 138 (*) 70 - 99 mg/dL   BUN 20  6 - 23 mg/dL   Creatinine, Ser 0.92  0.50 - 1.35 mg/dL   Calcium 9.2  8.4 - 10.5 mg/dL   GFR calc non Af Amer 68 (*) >90 mL/min   GFR calc Af Amer 79 (*) >90 mL/min   Comment: (NOTE)     The eGFR has been calculated using the CKD EPI equation.     This calculation has not been validated in all clinical situations.  eGFR's persistently <90 mL/min signify possible Chronic Kidney     Disease.   Anion gap 13  5 - 15    Dg Chest 1 View  02/16/2014   CLINICAL DATA:  Fall.  Hip fracture.  EXAM: CHEST - 1 VIEW  COMPARISON:  12/08/2013  FINDINGS: Complete opacification of the left hemi thorax is without change. This is likely a large effusion. There is no evidence of volume loss. Mediastinal structures are mildly displaced to the right supporting large pleural effusion.  Increased vascularity is noted in the right lung accentuated by the supine technique. No right lung consolidation or overt edema.  Bony thorax is extensively demineralized.  IMPRESSION: No significant change from the prior study. Large left pleural effusion opacifies the left hemi thorax. No convincing  pulmonary edema or infiltrate in the right lung.   Electronically Signed   By: Lajean Manes M.D.   On: 02/16/2014 20:44   Dg Hip Complete Left  02/16/2014   CLINICAL DATA:  Fall, left hip pain  EXAM: LEFT HIP - COMPLETE 2+ VIEW  COMPARISON:  02/13/2013  FINDINGS: Hips are located.  No pelvic fracture sacral fracture.  There is a curvilinear lucency extending from the greater trochanter towards the lesser trochanter of the left hip. This persists on 2 images. The hip is internally rotated on both images which limits evaluation.  IMPRESSION: Findings most consistent with left intertrochanteric femur fracture.   Electronically Signed   By: Suzy Bouchard M.D.   On: 02/16/2014 19:10    Review of Systems  All other systems reviewed and are negative.  Blood pressure 133/72, pulse 83, temperature 97.7 F (36.5 C), temperature source Oral, resp. rate 18, height _0  (1.803 m), weight 82.101 kg (181 lb), SpO2 97.00%. Physical Exam On examination patient's left lower extremity is shortened and externally rotated. Radiographs shows a intertrochanteric hip fracture of the left hip. Assessment/Plan: Assessment: Intertrochanteric left hip fracture.  Plan: Will plan for intramedullary nail fixation of the left hip. Surgery was  discussed with the patient's wife and his family with risks of infection neurovascular injury failure of fixation DVT need for additional surgery. Patient and family state they understand and wish to proceed with surgery at this time. Patient will require discharge to skilled nursing.  Tavi Gaughran V 02/17/2014, 7:28 AM

## 2014-02-17 NOTE — Progress Notes (Signed)
PROGRESS NOTE  Ronald Ball ZOX:[id number] DOB: 30-May-1914 DOA: 02/16/2014 PCP: Eustaquio Boyden, MD  Interim summary 80 year old male with a history of hypertension, osteoarthritis, lumbar compression fracture, presented after a mechanical fall trying to pick up his back brace. This resulted in a left intertrochanteric femur fracture. During the initial evaluation in the emergency department, the patient was noted to have complete opacification of his left hemithorax consistent with a large left pleural effusion. He was noted to have oxygen saturation of 87% on RA and he was placed on 2L [place].   This is been present since January 2015, but the patient had been refusing further workup. The patient denies any fevers, chills, chest pain, shortness breath, dizziness, nausea, vomiting. He denies any hemoptysis. The patient has never smoked. He denies any previous history of CHF, stroke or MI. At home, the patient normally gets around with a rolling walker.  Dr. Lajoyce Corners saw the pt and plans for ORIF.  IR was consulted for thoracocentesis Assessment/Plan: Acute respiratory failure -Secondary to pleural effusion -Supplemental oxygen Pleural effusion -Plan thoracocentesis -Send pleural fluid for cell count, culture, LDH, protein, cytology, ANA, glucose -echo -serum LDH and protein Left intertrochanteric femur fracture -Appreciate orthopedic surgery -PT/OT after surgery -DVT prophylaxis per ortho Hypertension -Stable off of lisinopril/HCTZ -Continue to monitor Diabetes mellitus type 2  -10/08/2011 hemoglobin A1c 6.8  -Given the patient's age and hemoglobin A1c, I would advocate for more liberal control of CBGs  Leukocytosis  -Likely stress demargination  -Afebrile and hemodynamically stable   Family Communication:   Wife and sons updated at beside Disposition Plan:   Likely snf when medically stable       Procedures/Studies: Dg Chest 1 View  02/16/2014   CLINICAL DATA:   Fall.  Hip fracture.  EXAM: CHEST - 1 VIEW  COMPARISON:  12/08/2013  FINDINGS: Complete opacification of the left hemi thorax is without change. This is likely a large effusion. There is no evidence of volume loss. Mediastinal structures are mildly displaced to the right supporting large pleural effusion.  Increased vascularity is noted in the right lung accentuated by the supine technique. No right lung consolidation or overt edema.  Bony thorax is extensively demineralized.  IMPRESSION: No significant change from the prior study. Large left pleural effusion opacifies the left hemi thorax. No convincing pulmonary edema or infiltrate in the right lung.   Electronically Signed   By: Amie Portland M.D.   On: 02/16/2014 20:44   Dg Hip Complete Left  02/16/2014   CLINICAL DATA:  Fall, left hip pain  EXAM: LEFT HIP - COMPLETE 2+ VIEW  COMPARISON:  02/13/2013  FINDINGS: Hips are located.  No pelvic fracture sacral fracture.  There is a curvilinear lucency extending from the greater trochanter towards the lesser trochanter of the left hip. This persists on 2 images. The hip is internally rotated on both images which limits evaluation.  IMPRESSION: Findings most consistent with left intertrochanteric femur fracture.   Electronically Signed   By: Genevive Bi M.D.   On: 02/16/2014 19:10         Subjective: Patient denies fevers, chills, headache, chest pain, dyspnea, nausea, vomiting, diarrhea, abdominal pain, dysuria, hematuria   Objective: Filed Vitals:   02/17/14 0000 02/17/14 0400 02/17/14 0422 02/17/14 0800  BP:   133/72   Pulse:   83   Temp:   97.7 F (36.5 C)   TempSrc:   Oral   Resp:  Height:      Weight:      SpO2: 97% 97% 97% 92%    Intake/Output Summary (Last 24 hours) at 02/17/14 1002 Last data filed at 02/17/14 0600  Gross per 24 hour  Intake  557.5 ml  Output    350 ml  Net  207.5 ml   Weight change:  Exam:   General:  Pt is alert, follows commands  appropriately, not in acute distress  HEENT: No icterus, No thrush, [place]/AT  Cardiovascular: RRR, S1/S2, no rubs, no gallops  Respiratory: Bibasilar crackles. Left diminished breath sounds. No wheezing. Good movement.   Abdomen: Soft/+BS, non tender, non distended, no guarding  Extremities: 1+LE edema, No lymphangitis, No petechiae, No rashes, no synovitis  Data Reviewed: Basic Metabolic Panel:  Recent Labs Lab 02/16/14 1804 02/17/14 0529  NA 135* 137  K 4.1 4.4  CL 93* 95*  CO2 30 29  GLUCOSE 135* 138*  BUN 22 20  CREATININE 1.05 0.92  CALCIUM 9.4 9.2   Liver Function Tests: No results found for this basename: AST, ALT, ALKPHOS, BILITOT, PROT, ALBUMIN,  in the last 168 hours No results found for this basename: LIPASE, AMYLASE,  in the last 168 hours No results found for this basename: AMMONIA,  in the last 168 hours CBC:  Recent Labs Lab 02/16/14 1804 02/17/14 0529  WBC 12.2* 15.1*  NEUTROABS 9.5*  --   HGB 14.1 14.0  HCT 43.5 43.9  MCV 90.2 90.3  PLT 187 179   Cardiac Enzymes: No results found for this basename: CKTOTAL, CKMB, CKMBINDEX, TROPONINI,  in the last 168 hours BNP: No components found with this basename: POCBNP,  CBG:  Recent Labs Lab 02/17/14 0050 02/17/14 0738  GLUCAP 157* 112*    No results found for this or any previous visit (from the past 240 hour(s)).   Scheduled Meds: . antiseptic oral rinse  7 mL Mouth Rinse q12n4p  .  ceFAZolin (ANCEF) IV  2 g Intravenous On Call to OR  . chlorhexidine  60 mL Topical Once  . insulin aspart  0-9 Units Subcutaneous 6 times per day  . latanoprost  1 drop Right Eye QHS  . timolol  1 drop Both Eyes BID   Continuous Infusions: . sodium chloride 75 mL/hr at 02/16/14 2234     Jaylnn Ullery, DO  [facility] Pager [phone]  If 7PM-7AM, please contact night-coverage [email or web address] Password [email or web address] 02/17/2014, 10:02 AM   LOS: 1 day

## 2014-02-17 NOTE — Transfer of Care (Signed)
Immediate Anesthesia Transfer of Care Note  Patient: Ronald Ball  Procedure(s) Performed: Procedure(s): INTRAMEDULLARY (IM) NAIL INTERTROCHANTRIC (Left)  Patient Location: PACU  Anesthesia Type:General  Level of Consciousness: awake, sedated and responds to stimulation  Airway & Oxygen Therapy: Patient Spontanous Breathing and Patient connected to face mask oxygen  Post-op Assessment: Report given to PACU RN and Post -op Vital signs reviewed and stable  Post vital signs: Reviewed and stable  Complications: No apparent anesthesia complications

## 2014-02-17 NOTE — Anesthesia Postprocedure Evaluation (Signed)
  Anesthesia Post-op Note  Patient: Ronald Ball  Procedure(s) Performed: Procedure(s) (LRB): INTRAMEDULLARY (IM) NAIL INTERTROCHANTRIC (Left)  Patient Location: PACU  Anesthesia Type: General  Level of Consciousness: awake and alert   Airway and Oxygen Therapy: Patient Spontanous Breathing  Post-op Pain: mild  Post-op Assessment: Post-op Vital signs reviewed, Patient's Cardiovascular Status Stable, Respiratory Function Stable, Patent Airway and No signs of Nausea or vomiting  Last Vitals:  Filed Vitals:   02/17/14 1500  BP: 131/74  Pulse: 72  Temp: 36.6 C  Resp: 14    Post-op Vital Signs: stable   Complications: No apparent anesthesia complications

## 2014-02-17 NOTE — Progress Notes (Signed)
Pt returned to room from OR by this Clinical research associate and Quincess, NT. Pt drowsy, arousable on calling, Ox4. Denies pain/nausea/discomfort. Dsgs to left hip c/d/i w/ ice in place. Very sleepy, VSS. Will monitor closely. Assessment as charted

## 2014-02-18 ENCOUNTER — Encounter: Payer: Self-pay | Admitting: Family Medicine

## 2014-02-18 DIAGNOSIS — J9600 Acute respiratory failure, unspecified whether with hypoxia or hypercapnia: Secondary | ICD-10-CM

## 2014-02-18 DIAGNOSIS — J90 Pleural effusion, not elsewhere classified: Secondary | ICD-10-CM

## 2014-02-18 DIAGNOSIS — S72143A Displaced intertrochanteric fracture of unspecified femur, initial encounter for closed fracture: Principal | ICD-10-CM

## 2014-02-18 DIAGNOSIS — J9601 Acute respiratory failure with hypoxia: Secondary | ICD-10-CM | POA: Diagnosis present

## 2014-02-18 DIAGNOSIS — R0602 Shortness of breath: Secondary | ICD-10-CM

## 2014-02-18 DIAGNOSIS — IMO0002 Reserved for concepts with insufficient information to code with codable children: Secondary | ICD-10-CM

## 2014-02-18 LAB — GLUCOSE, CAPILLARY
GLUCOSE-CAPILLARY: 129 mg/dL — AB (ref 70–99)
GLUCOSE-CAPILLARY: 129 mg/dL — AB (ref 70–99)
Glucose-Capillary: 137 mg/dL — ABNORMAL HIGH (ref 70–99)
Glucose-Capillary: 139 mg/dL — ABNORMAL HIGH (ref 70–99)
Glucose-Capillary: 148 mg/dL — ABNORMAL HIGH (ref 70–99)

## 2014-02-18 LAB — CBC
HEMATOCRIT: 38.2 % — AB (ref 39.0–52.0)
HEMOGLOBIN: 12.5 g/dL — AB (ref 13.0–17.0)
MCH: 28.9 pg (ref 26.0–34.0)
MCHC: 32.7 g/dL (ref 30.0–36.0)
MCV: 88.2 fL (ref 78.0–100.0)
Platelets: 179 10*3/uL (ref 150–400)
RBC: 4.33 MIL/uL (ref 4.22–5.81)
RDW: 13.7 % (ref 11.5–15.5)
WBC: 15.7 10*3/uL — ABNORMAL HIGH (ref 4.0–10.5)

## 2014-02-18 LAB — BASIC METABOLIC PANEL
Anion gap: 12 (ref 5–15)
BUN: 20 mg/dL (ref 6–23)
CALCIUM: 8.7 mg/dL (ref 8.4–10.5)
CO2: 27 mEq/L (ref 19–32)
Chloride: 93 mEq/L — ABNORMAL LOW (ref 96–112)
Creatinine, Ser: 0.93 mg/dL (ref 0.50–1.35)
GFR calc Af Amer: 79 mL/min — ABNORMAL LOW (ref >90)
GFR, EST NON AFRICAN AMERICAN: 68 mL/min — AB (ref >90)
Glucose, Bld: 130 mg/dL — ABNORMAL HIGH (ref 70–99)
Potassium: 4.3 mEq/L (ref 3.7–5.3)
Sodium: 132 mEq/L — ABNORMAL LOW (ref 137–147)

## 2014-02-18 LAB — HEMOGLOBIN A1C
Hgb A1c MFr Bld: 7.3 % — ABNORMAL HIGH (ref <5.7)
Mean Plasma Glucose: 163 mg/dL — ABNORMAL HIGH (ref <117)

## 2014-02-18 NOTE — Progress Notes (Signed)
Echocardiogram 2D Echocardiogram has been performed.  Ronald Ball 02/18/2014, 8:51 AM

## 2014-02-18 NOTE — ED Provider Notes (Signed)
Medical screening examination/treatment/procedure(s) were conducted as a shared visit with non-physician practitioner(s) and myself.  I personally evaluated the patient during the encounter.   EKG Interpretation   Date/Time:  Friday February 16 2014 17:23:16 EDT Ventricular Rate:  80 PR Interval:    QRS Duration: 97 QT Interval:  418 QTC Calculation: 482 R Axis:   57 Text Interpretation:  Sinus rhythm Low voltage, extremity and precordial  leads Borderline prolonged QT interval Baseline wander in lead(s) V5 No  old tracing to compare Confirmed by [facility]  MD, TREY [phone]) on 02/16/2014  11:34:30 PM        Jonny Ruiz Young Berry III, MD 02/18/14 1750

## 2014-02-18 NOTE — Consult Note (Signed)
Name: Ronald Ball MRN: [id number] DOB: [birth year]    ADMISSION DATE:  02/16/2014 CONSULTATION DATE:  02/18/2014  REFERRING MD :  TAT  CHIEF COMPLAINT:  Pleural effusion  BRIEF PATIENT DESCRIPTION:  80 yo male with hip fracture after falling.  He has hx of Lt pleural effusion since at least January 2015 >> pt declined evaluation previously.    SIGNIFICANT EVENTS: 9/18 Admit, ortho consulted 9/19 Lt hip nailing  STUDIES:  9/19 Lt thoracentesis by IR >> 1.3 liters yellow fluid, glucose 29, LDH 287, protein 4.2, WBC 12  HISTORY OF PRESENT ILLNESS:   80 yo male was found to have large Lt pleural effusion on CXR in January 2015.  On multiple occasions as outpt he declined any further assessment of this >> Per note from 06/13/13"What happens happens at my age, I didn't plan to live this long anyway".    He fell at home and had hip fx.  Decision was made to have thoracentesis done prior to surgery to optimize his pulmonary status prior to hip surgery, which he had done on 9/19.  His pleural fluid is consistent with an exudate.  He still had large amount of fluid, and PCCM consulted to assess.  His only respiratory complaint at present is related to nose irritation from having nasal cannula in.  He feels sleepy after getting pain medication.  He denies cough, chest congestion, wheeze, or chest pain.  His breathing feels the same as always.   PAST MEDICAL HISTORY :  Past Medical History  Diagnosis Date  . BPH (benign prostatic hyperplasia)   . Diverticulosis of colon   . History of barium enema 07/1991    Diverticuli  . History of CT scan of abdomen 08/14/05    abd/pelvis with and without R adrenal adenoma-stable  . Hyperlipidemia   . Glaucoma   . Hypertension   . Chronic back pain 2007    MRI scan of 03/15/06 revealed severe central canal stenosis at L4-5. He had extensive facet hypertrophy bilaterally. s/p ESI  . Constipation 11/2013    ER visit with miralax treatment  .  Diabetes mellitus     02/16/14 - family denies   Past Surgical History  Procedure Laterality Date  . Prostate biopsy  07/13/98    benign, cytoscopy wnl  . Shoulder surgery Right 04/03    [facility]  . Cataract extraction Left 04/01  . Cataract extraction Right 01/04    Prior to Admission medications   Medication Sig Start Date End Date Taking? Authorizing Provider  acetaminophen (TYLENOL) 500 MG tablet Take 500-1,000 mg by mouth daily as needed for mild pain.   Yes Historical Provider, MD  aspirin EC 81 MG tablet Take 81 mg by mouth daily.   Yes Historical Provider, MD  Carboxymethylcellulose Sodium (REFRESH PLUS OP) Apply 1 drop to eye 3 (three) times daily as needed (dry eyes). Both eyes   Yes Historical Provider, MD  latanoprost (XALATAN) 0.005 % ophthalmic solution Place 1 drop into the right eye at bedtime.    Yes Historical Provider, MD  lisinopril-hydrochlorothiazide (PRINZIDE,ZESTORETIC) 10-12.5 MG per tablet Take 1 tablet by mouth daily.   Yes Historical Provider, MD  magnesium hydroxide (MILK OF MAGNESIA) 400 MG/5ML suspension Take 45 mLs by mouth daily as needed for mild constipation.   Yes Historical Provider, MD  timolol (TIMOPTIC) 0.5 % ophthalmic solution Place 1 drop into both eyes 2 (two) times daily.     Yes Historical Provider, MD  acetaminophen (TYLENOL) 500  MG tablet Take 1 tablet (500 mg total) by mouth every 6 (six) hours as needed for mild pain. 02/17/14   Nadara Mustard, MD   Allergies  Allergen Reactions  . Atenolol     REACTION: ? Side effect  . Esomeprazole Magnesium     REACTION: abd swelling  . Ofloxacin     REACTION: itching    FAMILY HISTORY:  Family History  Problem Relation Age of Onset  . Cancer Mother     stomach  and colon  . Cancer Brother     possible lung cancer   SOCIAL HISTORY:  reports that he has never smoked. He has never used smokeless tobacco. He reports that he does not drink alcohol or use illicit drugs.  REVIEW OF SYSTEMS:     Negative except above.  SUBJECTIVE:   VITAL SIGNS: Temp:  [97.5 F (36.4 C)-99.6 F (37.6 C)] 97.6 F (36.4 C) (09/20 0447) Pulse Rate:  [71-91] 91 (09/20 0447) Resp:  [11-20] 18 (09/20 0800) BP: (100-154)/(53-87) 104/53 mmHg (09/20 0447) SpO2:  [88 %-100 %] 92 % (09/20 0800)  PHYSICAL EXAMINATION: General: no distress, sitting in chair Neuro:  Sleepy, follows commands, normal strength HEENT:  Decreased hearing acuity >> wears hearing aides Cardiovascular:  regular Lungs:  Decreased BS on Lt, no wheeze Abdomen:  Soft, non tender Musculoskeletal:  No edema Skin:  No rashes   Recent Labs Lab 02/16/14 1804 02/17/14 0529 02/18/14 0527  NA 135* 137 132*  K 4.1 4.4 4.3  CL 93* 95* 93*  CO2 BUN CREATININE 1.05 0.92 0.93  GLUCOSE 135* 138* 130*    Recent Labs Lab 02/16/14 1804 02/17/14 0529 02/18/14 0527  HGB 14.1 14.0 12.5*  HCT 43.5 43.9 38.2*  WBC 12.2* 15.1* 15.7*  PLT 187 179 179   Dg Chest 1 View  02/17/2014   CLINICAL DATA:  Status post left thoracentesis.  EXAM: CHEST - 1 VIEW  COMPARISON:  02/16/2014  FINDINGS: Cardiac silhouette remains largely obscured. There is persistent complete opacification of the left hemithorax. Rightward mediastinal shift may have minimally improved, although direct comparison is limited by patient rotation on the prior study. Pulmonary vascular congestion in the right lung has mildly improved. No right lung consolidation is seen. No pneumothorax is identified.  IMPRESSION: Persistent complete opacification of the left hemithorax, consistent with large pleural effusion. Rightward mediastinal shift may have slightly improved following thoracentesis. No pneumothorax identified.   Electronically Signed   By: Sebastian Ache   On: 02/17/2014 12:17   Dg Chest 1 View  02/16/2014   CLINICAL DATA:  Fall.  Hip fracture.  EXAM: CHEST - 1 VIEW  COMPARISON:  12/08/2013  FINDINGS: Complete opacification of the left hemi thorax is  without change. This is likely a large effusion. There is no evidence of volume loss. Mediastinal structures are mildly displaced to the right supporting large pleural effusion.  Increased vascularity is noted in the right lung accentuated by the supine technique. No right lung consolidation or overt edema.  Bony thorax is extensively demineralized.  IMPRESSION: No significant change from the prior study. Large left pleural effusion opacifies the left hemi thorax. No convincing pulmonary edema or infiltrate in the right lung.   Electronically Signed   By: Amie Portland M.D.   On: 02/16/2014 20:44   Dg Hip Complete Left  02/16/2014   CLINICAL DATA:  Fall, left hip pain  EXAM: LEFT HIP - COMPLETE 2+ VIEW  COMPARISON:  02/13/2013  FINDINGS: Hips are located.  No pelvic fracture sacral fracture.  There is a curvilinear lucency extending from the greater trochanter towards the lesser trochanter of the left hip. This persists on 2 images. The hip is internally rotated on both images which limits evaluation.  IMPRESSION: Findings most consistent with left intertrochanteric femur fracture.   Electronically Signed   By: Genevive Bi M.D.   On: 02/16/2014 19:10   Dg Hip Operative Left  02/17/2014   CLINICAL DATA:  ORIF of intertrochanteric left femoral neck fracture.  EXAM: OPERATIVE LEFT HIP 1 VIEW  COMPARISON:  Preoperative left hip x-rays yesterday.  FINDINGS: Single spot image from the C-arm fluoroscopic device, AP image of the left hip is submitted for interpretation post operatively. Patient has undergone ORIF of the old intertrochanteric left femoral neck fracture with placement of and answer medullary nail into the femur and a compression screw across fracture. Alignment appears anatomic.  IMPRESSION: Anatomic alignment in the AP projection post ORIF of the intertrochanteric left femoral neck fracture.   Electronically Signed   By: Hulan Saas M.D.   On: 02/17/2014 14:11   US Thoracentesis Asp Pleural  Space W/img Guide  02/17/2014   CLINICAL DATA:  Large left pleural effusion  EXAM: ULTRASOUND GUIDED left THORACENTESIS  COMPARISON:  None  PROCEDURE: An ultrasound guided thoracentesis was thoroughly discussed with the patient and questions answered. The benefits, risks, alternatives and complications were also discussed. The patient understands and wishes to proceed with the procedure. Written consent was obtained.  Ultrasound was performed to localize and mark an adequate pocket of fluid in the left chest. The area was then prepped and draped in the normal sterile fashion. 1% Lidocaine was used for local anesthesia. Under ultrasound guidance a 19 gauge Yueh catheter was introduced. Thoracentesis was performed. The catheter was removed and a dressing applied.  Complications:  None  FINDINGS: A total of approximately 1.3 L of yellow fluid was removed. A fluid sample wassent for laboratory analysis.  IMPRESSION: Successful ultrasound guided left thoracentesis yielding 1.3 L of pleural fluid.  Read by:  Robet Leu PAC-IR   Electronically Signed   By: Simonne Come M.D.   On: 02/17/2014 13:28    ASSESSMENT / PLAN:  Chronic Lt exudate pleural effusion.  He has declined intervention as an outpt on multiple occasions. Plan: F/u pleural fluid cx, cytology, ANA Discussed option of having pleurx catheter placed >> he would like to discuss this option further with his family before proceeding F/u CXR 9/21 Oxygen as needed to keep SpO2 > 92% Bronchial hygiene, mobilize as tolerated to avoid development of atelectasis Limit pain medications as tolerated to avoid hypoventilation  Summary: He has chronic Lt pleural effusion.  He is debating whether he would want any additional intervention at this time.  Effusion does not appear to be causing symptoms at present.  He would like to d/w family about his options before proceeding with any other interventions.  Discussed plan with pt's wife and son at  bedside.  Coralyn Helling, MD [facility] Pulmonary/Critical Care 02/18/2014, 11:45 AM Pager:  [phone] After 3pm call: [phone]

## 2014-02-18 NOTE — Progress Notes (Signed)
PROGRESS NOTE  Ronald Ball UXL:[id number] DOB: Nov 29, 1913 DOA: 02/16/2014 PCP: Eustaquio Boyden, MD  Interim summary  80 year old male with a history of hypertension, osteoarthritis, lumbar compression fracture, presented after a mechanical fall trying to pick up his back brace. This resulted in a left intertrochanteric femur fracture. During the initial evaluation in the emergency department, the patient was noted to have complete opacification of his left hemithorax consistent with a large left pleural effusion. He was noted to have oxygen saturation of 87% on RA and he was placed on 2L [place]. This is been present since January 2015, but the patient had been refusing further workup. The patient denies any fevers, chills, chest pain, shortness breath, dizziness, nausea, vomiting. He denies any hemoptysis. The patient has never smoked. He denies any previous history of CHF, stroke or MI. At home, the patient normally gets around with a rolling walker. Dr. Lajoyce Corners placed L-IM nail on 02/17/14. IR was consulted for thoracocentesis--1.3L was removed, but pt with residual large pleural effusion  Assessment/Plan:  Acute respiratory failure  -Secondary to pleural effusion  -Supplemental oxygen--remains stable on 3L Pleural effusion  -02/17/14--IR thoracocentesis--1.3L  -Pleural fluid appears exudative -I have consulted pulmonary--spoke with Dr. Craige Cotta -Send pleural fluid for cell count--WBC 12, glucose 29 -Pleural fluid culture--negative to date;  Cytology pending -pleural fluid ANA--pending -QuantiFERON -still with large left effusion--may need additional drainage -echo--results pending Left intertrochanteric femur fracture  -Appreciate orthopedic surgery  -PT/OT after surgery  -DVT prophylaxis per ortho  Hypertension  -Stable off of lisinopril/HCTZ--BP soft  -Continue to monitor  Diabetes mellitus type 2  -02/17/2014 hemoglobin A1c 7.3  -Given the patient's age and hemoglobin A1c, I  would advocate for more liberal control of CBGs  -novolog sliding scale Leukocytosis  -Likely stress demargination  -Afebrile and hemodynamically stable  Family Communication: Wife and sons updated at beside  Disposition Plan: Likely snf when medically stable        Procedures/Studies: Dg Chest 1 View  02/17/2014   CLINICAL DATA:  Status post left thoracentesis.  EXAM: CHEST - 1 VIEW  COMPARISON:  02/16/2014  FINDINGS: Cardiac silhouette remains largely obscured. There is persistent complete opacification of the left hemithorax. Rightward mediastinal shift may have minimally improved, although direct comparison is limited by patient rotation on the prior study. Pulmonary vascular congestion in the right lung has mildly improved. No right lung consolidation is seen. No pneumothorax is identified.  IMPRESSION: Persistent complete opacification of the left hemithorax, consistent with large pleural effusion. Rightward mediastinal shift may have slightly improved following thoracentesis. No pneumothorax identified.   Electronically Signed   By: Sebastian Ache   On: 02/17/2014 12:17   Dg Chest 1 View  02/16/2014   CLINICAL DATA:  Fall.  Hip fracture.  EXAM: CHEST - 1 VIEW  COMPARISON:  12/08/2013  FINDINGS: Complete opacification of the left hemi thorax is without change. This is likely a large effusion. There is no evidence of volume loss. Mediastinal structures are mildly displaced to the right supporting large pleural effusion.  Increased vascularity is noted in the right lung accentuated by the supine technique. No right lung consolidation or overt edema.  Bony thorax is extensively demineralized.  IMPRESSION: No significant change from the prior study. Large left pleural effusion opacifies the left hemi thorax. No convincing pulmonary edema or infiltrate in the right lung.   Electronically Signed   By: Amie Portland M.D.   On: 02/16/2014 20:44  Dg Hip Complete Left  02/16/2014   CLINICAL DATA:   Fall, left hip pain  EXAM: LEFT HIP - COMPLETE 2+ VIEW  COMPARISON:  02/13/2013  FINDINGS: Hips are located.  No pelvic fracture sacral fracture.  There is a curvilinear lucency extending from the greater trochanter towards the lesser trochanter of the left hip. This persists on 2 images. The hip is internally rotated on both images which limits evaluation.  IMPRESSION: Findings most consistent with left intertrochanteric femur fracture.   Electronically Signed   By: Genevive Bi M.D.   On: 02/16/2014 19:10   Dg Hip Operative Left  02/17/2014   CLINICAL DATA:  ORIF of intertrochanteric left femoral neck fracture.  EXAM: OPERATIVE LEFT HIP 1 VIEW  COMPARISON:  Preoperative left hip x-rays yesterday.  FINDINGS: Single spot image from the C-arm fluoroscopic device, AP image of the left hip is submitted for interpretation post operatively. Patient has undergone ORIF of the old intertrochanteric left femoral neck fracture with placement of and answer medullary nail into the femur and a compression screw across fracture. Alignment appears anatomic.  IMPRESSION: Anatomic alignment in the AP projection post ORIF of the intertrochanteric left femoral neck fracture.   Electronically Signed   By: Hulan Saas M.D.   On: 02/17/2014 14:11   US Thoracentesis Asp Pleural Space W/img Guide  02/17/2014   CLINICAL DATA:  Large left pleural effusion  EXAM: ULTRASOUND GUIDED left THORACENTESIS  COMPARISON:  None  PROCEDURE: An ultrasound guided thoracentesis was thoroughly discussed with the patient and questions answered. The benefits, risks, alternatives and complications were also discussed. The patient understands and wishes to proceed with the procedure. Written consent was obtained.  Ultrasound was performed to localize and mark an adequate pocket of fluid in the left chest. The area was then prepped and draped in the normal sterile fashion. 1% Lidocaine was used for local anesthesia. Under ultrasound guidance a 19  gauge Yueh catheter was introduced. Thoracentesis was performed. The catheter was removed and a dressing applied.  Complications:  None  FINDINGS: A total of approximately 1.3 L of yellow fluid was removed. A fluid sample wassent for laboratory analysis.  IMPRESSION: Successful ultrasound guided left thoracentesis yielding 1.3 L of pleural fluid.  Read by:  Robet Leu PAC-IR   Electronically Signed   By: Simonne Come M.D.   On: 02/17/2014 13:28         Subjective: Patient complains of some pain in his left hip otherwise denies any fevers, chills, chest pain, shortness breath, nausea, vomiting, diarrhea, vomiting, dysuria, headache, dizziness.  Objective: Filed Vitals:   02/18/14 0000 02/18/14 0156 02/18/14 0400 02/18/14 0447  BP:  100/53  104/53  Pulse:  85  91  Temp:  97.7 F (36.5 C)  97.6 F (36.4 C)  TempSrc:  Oral  Oral  Resp: Height:      Weight:      SpO2: 96% 97% 94% 94%    Intake/Output Summary (Last 24 hours) at 02/18/14 0940 Last data filed at 02/18/14 0644  Gross per 24 hour  Intake   1210 ml  Output    870 ml  Net    340 ml   Weight change:  Exam:   General:  Pt is alert, follows commands appropriately, not in acute distress  HEENT: No icterus, No thrush,  Guys/AT  Cardiovascular: RRR, S1/S2, no rubs, no gallops  Respiratory: Left--diminished breath sounds; right clear to auscultation  Abdomen: Soft/+BS,  non tender, non distended, no guarding  Extremities: trace LE edema, No lymphangitis, No petechiae, No rashes, no synovitis  Data Reviewed: Basic Metabolic Panel:  Recent Labs Lab 02/16/14 1804 02/17/14 0529 02/18/14 0527  NA 135* 137 132*  K 4.1 4.4 4.3  CL 93* 95* 93*  CO2 GLUCOSE 135* 138* 130*  BUN CREATININE 1.05 0.92 0.93  CALCIUM 9.4 9.2 8.7   Liver Function Tests:  Recent Labs Lab 02/17/14 1044  AST 14  ALT 9  ALKPHOS 92  BILITOT 1.1  PROT 6.6  ALBUMIN 3.3*   No results found for this  basename: LIPASE, AMYLASE,  in the last 168 hours No results found for this basename: AMMONIA,  in the last 168 hours CBC:  Recent Labs Lab 02/16/14 1804 02/17/14 0529 02/18/14 0527  WBC 12.2* 15.1* 15.7*  NEUTROABS 9.5*  --   --   HGB 14.1 14.0 12.5*  HCT 43.5 43.9 38.2*  MCV 90.2 90.3 88.2  PLT 187 179 179   Cardiac Enzymes: No results found for this basename: CKTOTAL, CKMB, CKMBINDEX, TROPONINI,  in the last 168 hours BNP: No components found with this basename: POCBNP,  CBG:  Recent Labs Lab 02/17/14 1615 02/17/14 2004 02/18/14 0003 02/18/14 0448 02/18/14 0740  GLUCAP 131* 118* 148* 129* 129*    Recent Results (from the past 240 hour(s))  SURGICAL PCR SCREEN     Status: None   Collection Time    02/17/14 12:07 PM      Result Value Ref Range Status   MRSA, PCR NEGATIVE  NEGATIVE Final   Staphylococcus aureus NEGATIVE  NEGATIVE Final   Comment:            The Xpert SA Assay (FDA     approved for NASAL specimens     in patients over 38 years of age),     is one component of     a comprehensive surveillance     program.  Test performance has     been validated by [employer] for patients greater     than or equal to 97 year old.     It is not intended     to diagnose infection nor to     guide or monitor treatment.     Scheduled Meds: . antiseptic oral rinse  7 mL Mouth Rinse q12n4p  . aspirin EC  325 mg Oral Q breakfast  . insulin aspart  0-9 Units Subcutaneous 6 times per day  . latanoprost  1 drop Right Eye QHS  . timolol  1 drop Both Eyes BID   Continuous Infusions: . sodium chloride 10 mL/hr at 02/17/14 1640     Dan Scearce, DO  [facility] Pager [phone]  If 7PM-7AM, please contact night-coverage [email or web address] Password [email or web address] 02/18/2014, 9:40 AM   LOS: 2 days

## 2014-02-18 NOTE — Clinical Social Work Placement (Signed)
Clinical Social Work Department CLINICAL SOCIAL WORK PLACEMENT NOTE 02/18/2014  Patient:  BRENNIN, DURFEE  Account Number:  [id number] Admit date:  02/16/2014  Clinical Social Worker:  Robin Searing  Date/time:  02/18/2014 03:55 PM  Clinical Social Work is seeking post-discharge placement for this patient at the following level of care:   SKILLED NURSING   (*CSW will update this form in Epic as items are completed)   02/18/2014  Patient/family provided with [facility] Department of Clinical Social Work's list of facilities offering this level of care within the geographic area requested by the patient (or if unable, by the patient's family).  02/18/2014  Patient/family informed of their freedom to choose among providers that offer the needed level of care, that participate in Medicare, Medicaid or managed care program needed by the patient, have an available bed and are willing to accept the patient.  02/18/2014  Patient/family informed of [facility]' ownership interest in [facility], as well as of the fact that they are under no obligation to receive care at this facility.  PASARR submitted to EDS on 02/18/2014 PASARR number received on 02/18/2014  FL2 transmitted to all facilities in geographic area requested by pt/family on  02/18/2014 FL2 transmitted to all facilities within larger geographic area on   Patient informed that his/her managed care company has contracts with or will negotiate with  certain facilities, including the following:     Patient/family informed of bed offers received:   Patient chooses bed at  Physician recommends and patient chooses bed at    Patient to be transferred to  on   Patient to be transferred to facility by  Patient and family notified of transfer on  Name of family member notified:    The following physician request were entered in Epic:   Additional Comm Reece Levy, MSW, [place] [phone] weekend coverage

## 2014-02-18 NOTE — Clinical Social Work Note (Signed)
CSW met with patient and family (wife of 30 years and 2 sons) who are in agreement with SNF search.  They are interested in something closer to their home off [address]- and ask that [facility] and [facility] be considered. CSW has faxed information to area SNF's as well as left message for these 2 SNF's- CSW covering tomorrow to follow up for further planning and [place], MSW, [place]

## 2014-02-18 NOTE — Progress Notes (Signed)
INITIAL NUTRITION ASSESSMENT  DOCUMENTATION CODES Per approved criteria  -Severe malnutrition in the context of chronic illness  Pt meets criteria for severe MALNUTRITION in the context of chronic illness as evidenced by 11% weight loss in 3 months and po intake <75% of estimated needs for >1 month.  INTERVENTION: - Once diet upgraded, add Ensure Complete po BID, each supplement provides 350 kcal and 13 grams of protein - RD will continue to monitor for nutrition care plan.  NUTRITION DIAGNOSIS: Inadequate oral intake related to pain medication as evidenced by weight loss and poor po.   Goal: Pt to meet >/= 90% of their estimated nutrition needs   Monitor:  Weight trend, po intake, acceptance of supplements, labs  Reason for Assessment: MST  80 y.o. male  Admitting Dx: Hip fracture, left  ASSESSMENT: 80 yo male has had some back issues lately and is wearing a back brace. Today when he went to take the brace off, it fell to his feet and he tripped and fell on it hurting his left hip.   - Pt underwent hip fracture repair 9/19. - Wife and son provided most of pt's nutritional history.  - Pt has been eating poorly for the past couple of months and has lost about 25 lbs. They attribute this to pt starting pain medication.  - Pt said that he has not had much of an appetite. He says that he has been tolerating clear liquids.  - Pt with no signs of fat or muscle wasting.   Labs: CBGs: 129-137 Na low K and BUN WNL  Height: Ht Readings from Last 1 Encounters:  02/16/14  (1.803 m)    Weight: Wt Readings from Last 1 Encounters:  02/16/14 181 lb (82.101 kg)    Ideal Body Weight: 75.3 kg  % Ideal Body Weight: 109%  Wt Readings from Last 10 Encounters:  02/16/14 181 lb (82.101 kg)  02/16/14 181 lb (82.101 kg)  01/11/14 186 lb 8 oz (84.596 kg)  12/25/13 188 lb 8 oz (85.503 kg)  11/16/13 201 lb 8 oz (91.4 kg)  11/14/13 204 lb 8 oz (92.761 kg)  10/24/13 204 lb (92.534  kg)  10/09/13 206 lb (93.441 kg)  08/24/13 194 lb (87.998 kg)  06/13/13 207 lb 4 oz (94.008 kg)    Usual Body Weight: 208 lbs  % Usual Body Weight: 87%  BMI:  Body mass index is 25.26 kg/(m^2).  Estimated Nutritional Needs: Kcal: 2050-2300 Protein: 100-115 g Fluid: 2.3 L/day  Skin: closed incision on left hip  Diet Order: Clear Liquid  EDUCATION NEEDS: -Education needs addressed   Intake/Output Summary (Last 24 hours) at 02/18/14 1346 Last data filed at 02/18/14 0900  Gross per 24 hour  Intake   1690 ml  Output    970 ml  Net    720 ml    Last BM: prior to admission   Labs:   Recent Labs Lab 02/16/14 1804 02/17/14 0529 02/18/14 0527  NA 135* 137 132*  K 4.1 4.4 4.3  CL 93* 95* 93*  CO2 BUN CREATININE 1.05 0.92 0.93  CALCIUM 9.4 9.2 8.7  GLUCOSE 135* 138* 130*    CBG (last 3)   Recent Labs  02/18/14 0448 02/18/14 0740 02/18/14 1148  GLUCAP 129* 129* 137*    Scheduled Meds: . antiseptic oral rinse  7 mL Mouth Rinse q12n4p  . aspirin EC  325 mg Oral Q breakfast  . insulin aspart  0-9 Units Subcutaneous 6 times per day  . latanoprost  1 drop Right Eye QHS  . timolol  1 drop Both Eyes BID    Continuous Infusions: . sodium chloride 10 mL/hr at 02/17/14 1640    Past Medical History  Diagnosis Date  . BPH (benign prostatic hyperplasia)   . Diverticulosis of colon   . History of barium enema 07/1991    Diverticuli  . History of CT scan of abdomen 08/14/05    abd/pelvis with and without R adrenal adenoma-stable  . Hyperlipidemia   . Glaucoma   . Hypertension   . Chronic back pain 2007    MRI scan of 03/15/06 revealed severe central canal stenosis at L4-5. He had extensive facet hypertrophy bilaterally. s/p ESI  . Constipation 11/2013    ER visit with miralax treatment  . Diabetes mellitus     02/16/14 - family denies  . Intertrochanteric fracture of left hip 01/2014  . Pleural effusion on left 2015    pt had declined  further eval in past    Past Surgical History  Procedure Laterality Date  . Prostate biopsy  07/13/98    benign, cytoscopy wnl  . Shoulder surgery Right 04/03    [facility]  . Cataract extraction Left 04/01  . Cataract extraction Right 01/04   . Intramedullary (im) nail intertrochanteric Left 01/2014    Harriette Bouillon RD, LDN

## 2014-02-18 NOTE — Evaluation (Signed)
Physical Therapy Evaluation Patient Details Name: Ronald Ball MRN: [id number] DOB: [birth year] Today's Date: 02/18/2014   History of Present Illness  80 y.o. male with h/o chronic back pain, pleural effusion admitted with fall and L hip fx. s/p IM Nail 02/17/14.   Clinical Impression  *Pt admitted with L hip fx, s/p IM nail, pleural effusion*. Pt currently with functional limitations due to the deficits listed below (see PT Problem List).  Pt will benefit from skilled PT to increase their independence and safety with mobility to allow discharge to the venue listed below.   Pt at baseline walks with a rollator and has assist for bathing/dressing. Today he required assist of 2 for supine to sit and to transfer to a recliner. ST-SNF recommended.   **    Follow Up Recommendations SNF;Supervision/Assistance - 24 hour    Equipment Recommendations  None recommended by PT    Recommendations for Other Services OT consult     Precautions / Restrictions Precautions Precautions: Fall Precaution Comments: h/o 3 falls in past year Restrictions Weight Bearing Restrictions: No Other Position/Activity Restrictions: WBAT LLE      Mobility  Bed Mobility Overal bed mobility: Needs Assistance Bed Mobility: Supine to Sit     Supine to sit: +2 for physical assistance;Max assist     General bed mobility comments: assist to advance BLEs and raise trunk pt 25%  Transfers Overall transfer level: Needs assistance Equipment used: Rolling walker (2 wheeled) Transfers: Sit to/from Stand Sit to Stand: +2 physical assistance;Max assist         General transfer comment: assist to rise pt 30%  Ambulation/Gait Ambulation/Gait assistance: +2 physical assistance;+2 safety/equipment;Max assist Ambulation Distance (Feet): 2 Feet Assistive device: Rolling walker (2 wheeled) Gait Pattern/deviations: Decreased step length - left;Decreased stance time - right;Decreased weight shift to  left;Shuffle     General Gait Details: L knee buckled with WB, assist for balance, difficulty weight shifting/unweighting RLE, pt able to take a few shuffling side steps but then was unable to take further steps and required assist to pivot hips to recliner  Stairs            Wheelchair Mobility    Modified Rankin (Stroke Patients Only)       Balance Overall balance assessment: Needs assistance Sitting-balance support: Feet supported;Bilateral upper extremity supported Sitting balance-Leahy Scale: Fair     Standing balance support: Bilateral upper extremity supported Standing balance-Leahy Scale: Poor                               Pertinent Vitals/Pain Pain Assessment: 0-10 Pain Score: 5  Pain Location: back and L hip Pain Intervention(s): RN gave pain meds during session;Monitored during session;Limited activity within patient's tolerance;Repositioned;Ice applied    Home Living Family/patient expects to be discharged to:: Private residence Living Arrangements: Spouse/significant other   Type of Home: House Home Access: Stairs to enter;Ramped entrance Entrance Stairs-Rails: Can reach both Entrance Stairs-Number of Steps: 1 Home Layout: Two level;Able to live on main level with bedroom/bathroom;Laundry or work area in [employer] Equipment: Environmental consultant - 4 wheels;Cane - single point;Shower seat;Walker - 2 wheels      Prior Function Level of Independence: Needs assistance   Gait / Transfers Assistance Needed: h/o 3 falls in past year, walks with rollator, cane, or RW  ADL's / Homemaking Assistance Needed: wife assists with bathing        Hand Dominance  Extremity/Trunk Assessment   Upper Extremity Assessment: Overall WFL for tasks assessed           Lower Extremity Assessment: LLE deficits/detail   LLE Deficits / Details: hip ABDuction and flexion AAROM WFL, strength +2/5, ankle WNL  Cervical / Trunk Assessment: Kyphotic   Communication   Communication: HOH  Cognition Arousal/Alertness: Awake/alert Behavior During Therapy: WFL for tasks assessed/performed Overall Cognitive Status: Within Functional Limits for tasks assessed                      General Comments      Exercises General Exercises - Lower Extremity Ankle Circles/Pumps: AROM;Both;5 reps;Supine Heel Slides: AAROM;Left;10 reps;Supine Hip ABduction/ADduction: AAROM;Left;10 reps;Supine      Assessment/Plan    PT Assessment Patient needs continued PT services  PT Diagnosis Difficulty walking;Generalized weakness;Acute pain   PT Problem List Decreased strength;Decreased range of motion;Decreased activity tolerance;Decreased balance;Pain;Decreased mobility  PT Treatment Interventions DME instruction;Gait training;Functional mobility training;Therapeutic activities;Patient/family education;Therapeutic exercise;Balance training   PT Goals (Current goals can be found in the Care Plan section) Acute Rehab PT Goals Patient Stated Goal: return to prior level of function PT Goal Formulation: With patient/family Time For Goal Achievement: 03/04/14 Potential to Achieve Goals: Fair    Frequency Min 3X/week   Barriers to discharge        Co-evaluation               End of Session Equipment Utilized During Treatment: Gait belt Activity Tolerance: Patient limited by pain;Patient limited by fatigue Patient left: in chair;with call bell/phone within reach;with family/visitor present Nurse Communication: Mobility status;Need for lift equipment         Time: 1025-1102 PT Time Calculation (min): 37 min   Charges:   PT Evaluation $Initial PT Evaluation Tier I: 1 Procedure PT Treatments $Gait Training: 8-22 mins $Therapeutic Exercise: 8-22 mins   PT G Codes:          Tamala Ser 02/18/2014, 11:24 AM [phone]

## 2014-02-18 NOTE — Progress Notes (Signed)
Patient ID: Ronald Ball, male   DOB: [birth year], 80 y.o.   MRN: [id number] Patient alert and oriented without complaints this morning. Plan for discharge to skilled nursing facility. Weightbearing as tolerated on the left. I will followup in the office in 2 weeks.

## 2014-02-18 NOTE — Progress Notes (Signed)
Physical Therapy Treatment Patient Details Name: Ronald Ball MRN: [id number] DOB: 1914-03-12 Today's Date: 02/18/2014    History of Present Illness 80 y.o. male with h/o back pain, pleural effusion admitted with fall and L hip fx. s/p IM Nail 02/17/14.     PT Comments    *Received call from nursing that they were unable to get pt back to bed from recliner and that the pt's family was upset. Following this morning's tx, I verbalized to RN that maximove would be needed, nursing did not attempt to use the maximove this  afternoon. Assisted pt back to bed with maximove. **  Follow Up Recommendations  SNF;Supervision/Assistance - 24 hour     Equipment Recommendations  None recommended by PT    Recommendations for Other Services OT consult     Precautions / Restrictions Precautions Precautions: Fall Precaution Comments: h/o 3 falls in past year Restrictions Weight Bearing Restrictions: No Other Position/Activity Restrictions: WBAT LLE    Mobility  Bed Mobility Overal bed mobility: Needs Assistance Bed Mobility: Sit to Supine     Supine to sit: +2 for physical assistance;Max assist     General bed mobility comments: assist to control descent and bring up LEs  Transfers Overall transfer level: Needs assistance Equipment used: Rolling walker (2 wheeled) Transfers: Sit to/from Stand Sit to Stand: +2 physical assistance;Max assist         General transfer comment: mechanical lift used to transfer pt from recliner to bed  Ambulation/Gait Ambulation/Gait assistance: +2 physical assistance;+2 safety/equipment;Max assist Ambulation Distance (Feet): 2 Feet Assistive device: Rolling walker (2 wheeled) Gait Pattern/deviations: Decreased step length - left;Decreased stance time - right;Decreased weight shift to left;Shuffle     General Gait Details: L knee buckled with WB, assist for balance, difficulty weight shifting/unweighting RLE, pt able to take a few shuffling  side steps but then was unable to take further steps and required assist to pivot hips to recliner   Stairs            Wheelchair Mobility    Modified Rankin (Stroke Patients Only)       Balance Overall balance assessment: Needs assistance Sitting-balance support: Feet supported;Bilateral upper extremity supported Sitting balance-Leahy Scale: Fair     Standing balance support: Bilateral upper extremity supported Standing balance-Leahy Scale: Poor                      Cognition Arousal/Alertness: Awake/alert Behavior During Therapy: WFL for tasks assessed/performed Overall Cognitive Status: Within Functional Limits for tasks assessed                      Exercises General Exercises - Lower Extremity Ankle Circles/Pumps: AROM;Both;5 reps;Supine Heel Slides: AAROM;Left;10 reps;Supine Hip ABduction/ADduction: AAROM;Left;10 reps;Supine    General Comments        Pertinent Vitals/Pain Pain Assessment: 0-10 Pain Score: 5  Pain Location: back and L hip Pain Intervention(s): RN gave pain meds during session;Monitored during session;Limited activity within patient's tolerance;Repositioned;Ice applied    Home Living                      Prior Function            PT Goals (current goals can now be found in the care plan section) Acute Rehab PT Goals Patient Stated Goal: return to prior level of function PT Goal Formulation: With patient/family Time For Goal Achievement: 03/04/14 Potential to Achieve Goals: Fair Progress towards PT  goals: Progressing toward goals    Frequency  Min 3X/week    PT Plan      Co-evaluation             End of Session Equipment Utilized During Treatment: Gait belt Activity Tolerance: Patient limited by pain;Patient limited by fatigue Patient left: in chair;with call bell/phone within reach;with family/visitor present     Time: 4098-1191 PT Time Calculation (min): 23 min  Charges: $Therapeutic  Activity: 23-37 mins                    G Codes:      Tamala Ser 02/18/2014, 2:49 PM [phone]

## 2014-02-19 ENCOUNTER — Inpatient Hospital Stay (HOSPITAL_COMMUNITY): Payer: Medicare PPO

## 2014-02-19 ENCOUNTER — Encounter (HOSPITAL_COMMUNITY): Payer: Self-pay | Admitting: Orthopedic Surgery

## 2014-02-19 LAB — CBC
HCT: 36.8 % — ABNORMAL LOW (ref 39.0–52.0)
HEMOGLOBIN: 11.8 g/dL — AB (ref 13.0–17.0)
MCH: 28.9 pg (ref 26.0–34.0)
MCHC: 32.1 g/dL (ref 30.0–36.0)
MCV: 90.2 fL (ref 78.0–100.0)
Platelets: 152 10*3/uL (ref 150–400)
RBC: 4.08 MIL/uL — ABNORMAL LOW (ref 4.22–5.81)
RDW: 13.8 % (ref 11.5–15.5)
WBC: 14.2 10*3/uL — ABNORMAL HIGH (ref 4.0–10.5)

## 2014-02-19 LAB — BASIC METABOLIC PANEL
Anion gap: 11 (ref 5–15)
BUN: 21 mg/dL (ref 6–23)
CO2: 26 meq/L (ref 19–32)
Calcium: 8.6 mg/dL (ref 8.4–10.5)
Chloride: 96 mEq/L (ref 96–112)
Creatinine, Ser: 0.89 mg/dL (ref 0.50–1.35)
GFR calc non Af Amer: 69 mL/min — ABNORMAL LOW (ref >90)
GFR, EST AFRICAN AMERICAN: 80 mL/min — AB (ref >90)
GLUCOSE: 99 mg/dL (ref 70–99)
POTASSIUM: 4.7 meq/L (ref 3.7–5.3)
Sodium: 133 mEq/L — ABNORMAL LOW (ref 137–147)

## 2014-02-19 LAB — GLUCOSE, CAPILLARY
GLUCOSE-CAPILLARY: 97 mg/dL (ref 70–99)
GLUCOSE-CAPILLARY: 93 mg/dL (ref 70–99)
GLUCOSE-CAPILLARY: 167 mg/dL — AB (ref 70–99)
Glucose-Capillary: 117 mg/dL — ABNORMAL HIGH (ref 70–99)
Glucose-Capillary: 126 mg/dL — ABNORMAL HIGH (ref 70–99)
Glucose-Capillary: 154 mg/dL — ABNORMAL HIGH (ref 70–99)
Glucose-Capillary: 277 mg/dL — ABNORMAL HIGH (ref 70–99)
Glucose-Capillary: 94 mg/dL (ref 70–99)

## 2014-02-19 MED ORDER — HYDROCODONE-ACETAMINOPHEN 5-325 MG PO TABS: 1.0000 | ORAL_TABLET | Freq: Four times a day (QID) | ORAL | Status: DC | PRN

## 2014-02-19 MED ORDER — ASPIRIN 325 MG PO TBEC
325.0000 mg | DELAYED_RELEASE_TABLET | Freq: Every day | ORAL | Status: DC
Start: 2014-02-19 — End: 2014-03-18

## 2014-02-19 MED ORDER — DILTIAZEM HCL 30 MG PO TABS
30.0000 mg | ORAL_TABLET | Freq: Three times a day (TID) | ORAL | Status: DC
  Filled 2014-02-19 (×2): qty 1

## 2014-02-19 MED ORDER — ENSURE COMPLETE PO LIQD
237.0000 mL | Freq: Two times a day (BID) | ORAL | Status: DC
  Administered 2014-02-19 – 2014-02-21 (×4): 237 mL via ORAL

## 2014-02-19 NOTE — Progress Notes (Signed)
Name: Ronald Ball MRN: [id number] DOB: 03-26-1914    ADMISSION DATE:  02/16/2014 CONSULTATION DATE:  02/18/2014  REFERRING MD :  TAT  CHIEF COMPLAINT:  Pleural effusion  SIGNIFICANT EVENTS: 9/18 Admit, ortho consulted 9/19 Lt hip nailing  STUDIES:  9/19 Lt thoracentesis by IR >> 1.3 liters yellow fluid, glucose 29, LDH 287, protein 4.2, WBC 12  HISTORY OF PRESENT ILLNESS:   80 yo male was found to have large Lt pleural effusion on CXR in January 2015.  On multiple occasions as outpt he declined any further assessment of this >> Per note from 06/13/13"What happens happens at my age, I didn't plan to live this long anyway".    He fell at home and had hip fx.  Decision was made to have thoracentesis done prior to surgery to optimize his pulmonary status prior to hip surgery, which he had done on 9/19.  His pleural fluid is consistent with an exudate.  He still had large amount of fluid, and PCCM consulted to assess.  His only respiratory complaint at present is related to nose irritation from having nasal cannula in.  He feels sleepy after getting pain medication.  He denies cough, chest congestion, wheeze, or chest pain.  His breathing feels the same as always.   SUBJECTIVE: No dyspnea, cp  VITAL SIGNS: Temp:  [97.6 F (36.4 C)-98.3 F (36.8 C)] 98.3 F (36.8 C) (09/21 0903) Pulse Rate:  [85-90] 87 (09/21 0903) Resp:  [18-20] 18 (09/21 1104) BP: (94-114)/(51-66) 94/51 mmHg (09/21 0903) SpO2:  [93 %-98 %] 96 % (09/21 0903)  PHYSICAL EXAMINATION: General: no distress, able to liw supine Neuro:  Sleepy, follows commands, normal strength HEENT:  Decreased hearing acuity >> wears hearing aides Cardiovascular:  regular Lungs:  Decreased BS on Lt, no wheeze Abdomen:  Soft, non tender Musculoskeletal:  No edema Skin:  No rashes   Recent Labs Lab 02/17/14 0529 02/18/14 0527 02/19/14 0424  NA 137 132* 133*  K 4.4 4.3 4.7  CL 95* 93* 96  CO2 BUN CREATININE 0.92 0.93 0.89  GLUCOSE 138* 130* 99    Recent Labs Lab 02/17/14 0529 02/18/14 0527 02/19/14 0424  HGB 14.0 12.5* 11.8*  HCT 43.9 38.2* 36.8*  WBC 15.1* 15.7* 14.2*  PLT 179 179 152   Dg Chest 1 View  02/17/2014   CLINICAL DATA:  Status post left thoracentesis.  EXAM: CHEST - 1 VIEW  COMPARISON:  02/16/2014  FINDINGS: Cardiac silhouette remains largely obscured. There is persistent complete opacification of the left hemithorax. Rightward mediastinal shift may have minimally improved, although direct comparison is limited by patient rotation on the prior study. Pulmonary vascular congestion in the right lung has mildly improved. No right lung consolidation is seen. No pneumothorax is identified.  IMPRESSION: Persistent complete opacification of the left hemithorax, consistent with large pleural effusion. Rightward mediastinal shift may have slightly improved following thoracentesis. No pneumothorax identified.   Electronically Signed   By: Sebastian Ache   On: 02/17/2014 12:17   Dg Hip Operative Left  02/17/2014   CLINICAL DATA:  ORIF of intertrochanteric left femoral neck fracture.  EXAM: OPERATIVE LEFT HIP 1 VIEW  COMPARISON:  Preoperative left hip x-rays yesterday.  FINDINGS: Single spot image from the C-arm fluoroscopic device, AP image of the left hip is submitted for interpretation post operatively. Patient has undergone ORIF of the old intertrochanteric left femoral neck fracture with placement of and answer medullary  nail into the femur and a compression screw across fracture. Alignment appears anatomic.  IMPRESSION: Anatomic alignment in the AP projection post ORIF of the intertrochanteric left femoral neck fracture.   Electronically Signed   By: Hulan Saas M.D.   On: 02/17/2014 14:11   Dg Chest Port 1 View  02/19/2014   CLINICAL DATA:  Effusion.  EXAM: PORTABLE CHEST - 1 VIEW  COMPARISON:  02/17/2014  FINDINGS: Unchanged complete opacification of the left chest. Mild  rightward shift of mediastinum is unchanged. No evidence of pneumothorax after recent thoracentesis. The right lung remains relatively well aerated.  Heart size is obscured by the pleural disease. Right heart borders are unchanged.  Osteopenia.  IMPRESSION: Unchanged complete opacification of the left chest related to a large pleural effusion.   Electronically Signed   By: Tiburcio Pea M.D.   On: 02/19/2014 06:38    ASSESSMENT / PLAN:  Chronic Lt exudate pleural effusion.  He has declined intervention as an outpt on multiple occasions. Plan: F/u pleural fluid cx, cytology, ANA Discussed option of having pleurx catheter placed, son & wife present >>  Best to defer this-  Effusion does not appear to be causing symptoms at present. Oxygen as needed to keep SpO2 > 92% Bronchial hygiene, mobilize as tolerated to avoid development of atelectasis Limit pain medications as tolerated to avoid hypoventilation   PCCM available as needed  Oretha Milch. MD   02/19/2014, 11:32 AM

## 2014-02-19 NOTE — Progress Notes (Signed)
CSW spoke with patient's son, [name] re: SNF decision - family have accepted SNF bed offer at [facility]. SNF submitted clinical information for [facility] insurance authorization.   Clinical Social Work Department CLINICAL SOCIAL WORK PLACEMENT NOTE 02/19/2014  Patient:  Ronald Ball, Ronald Ball  Account Number:  [id number] Admit date:  02/16/2014  Clinical Social Worker:  Robin Searing  Date/time:  02/18/2014 03:55 PM  Clinical Social Work is seeking post-discharge placement for this patient at the following level of care:   SKILLED NURSING   (*CSW will update this form in Epic as items are completed)   02/18/2014  Patient/family provided with [facility] Department of Clinical Social Work's list of facilities offering this level of care within the geographic area requested by the patient (or if unable, by the patient's family).  02/18/2014  Patient/family informed of their freedom to choose among providers that offer the needed level of care, that participate in Medicare, Medicaid or managed care program needed by the patient, have an available bed and are willing to accept the patient.  02/18/2014  Patient/family informed of [facility]' ownership interest in [facility], as well as of the fact that they are under no obligation to receive care at this facility.  PASARR submitted to EDS on 02/18/2014 PASARR number received on 02/18/2014  FL2 transmitted to all facilities in geographic area requested by pt/family on  02/18/2014 FL2 transmitted to all facilities within larger geographic area on   Patient informed that his/her managed care company has contracts with or will negotiate with  certain facilities, including the following:     Patient/family informed of bed offers received:  02/19/2014 Patient chooses bed at [facility] Physician recommends and patient chooses bed at    Patient to be transferred to [facility] on   Patient  to be transferred to facility by  Patient and family notified of transfer on  Name of family member notified:    The following physician request were entered in Epic:   Additional Comments:   Lincoln Maxin, LCSW [facility] Clinical Social Worker cell #: [phone]

## 2014-02-19 NOTE — Progress Notes (Addendum)
PROGRESS NOTE  Ronald Ball WUJ:[id number] DOB: 08-30-1913 DOA: 02/16/2014 PCP: Eustaquio Boyden, MD  Interim summary  80 year old male with a history of hypertension, osteoarthritis, lumbar compression fracture, presented after a mechanical fall trying to pick up his back brace. This resulted in a left intertrochanteric femur fracture. During the initial evaluation in the emergency department, the patient was noted to have complete opacification of his left hemithorax consistent with a large left pleural effusion. He was noted to have oxygen saturation of 87% on RA and he was placed on 2L [place]. This is been present since January 2015, but the patient had been refusing further workup. The patient denies any fevers, chills, chest pain, shortness breath, dizziness, nausea, vomiting. He denies any hemoptysis. The patient has never smoked. He denies any previous history of CHF, stroke or MI. At home, the patient normally gets around with a rolling walker. Dr. Lajoyce Corners placed L-IM nail on 02/17/14. IR was consulted for thoracocentesis--1.3L was removed, but pt with residual large pleural effusion. Pulmonary medicine was consulted. After discussion with the patient's family and the patient, and the decision was made to hold off on any further invasive procedures regarding the patient's left pleural effusion unless the patient decompensates. As the patient is presently stable without any respiratory distress or shortness of breath, the patient and the family decided against any further interventions at this time for  Assessment/Plan:  Acute respiratory failure  -Secondary to pleural effusion  -Supplemental oxygen--remains stable on 2L  Pleural effusion  -02/17/14--IR thoracocentesis--1.3L  -Pleural fluid appears exudative  -I have consulted pulmonary--spoke with Dr. Craige Cotta  -Send pleural fluid for cell count--WBC 12, glucose 29  -Pleural fluid culture--negative to date; Cytology pending  -pleural  fluid ANA--pending  -Discussed with patient and family at the bedside--they did not wish to pursue any further invasive at this time as the patient is stable without any distress -appreciate pulmonary followup -echo--limited but normal EF Left intertrochanteric femur fracture  -Appreciate orthopedic surgery  -PT/OT--->SNF  -DVT prophylaxis per ortho  -02/17/14--L-IM nail placed by Dr. Lajoyce Corners Hypertension  -Stable off of lisinopril/HCTZ--BP soft  -Continue to monitor  Diabetes mellitus type 2  -02/17/2014 hemoglobin A1c 7.3  -d/c CBGs and sliding scale Leukocytosis  -Likely stress demargination  -Afebrile and hemodynamically stable  Family Communication: Wife and sons updated at beside  Disposition Plan: Likely snf when medically stable     Procedures/Studies: Dg Chest 1 View  02/17/2014   CLINICAL DATA:  Status post left thoracentesis.  EXAM: CHEST - 1 VIEW  COMPARISON:  02/16/2014  FINDINGS: Cardiac silhouette remains largely obscured. There is persistent complete opacification of the left hemithorax. Rightward mediastinal shift may have minimally improved, although direct comparison is limited by patient rotation on the prior study. Pulmonary vascular congestion in the right lung has mildly improved. No right lung consolidation is seen. No pneumothorax is identified.  IMPRESSION: Persistent complete opacification of the left hemithorax, consistent with large pleural effusion. Rightward mediastinal shift may have slightly improved following thoracentesis. No pneumothorax identified.   Electronically Signed   By: Sebastian Ache   On: 02/17/2014 12:17   Dg Chest 1 View  02/16/2014   CLINICAL DATA:  Fall.  Hip fracture.  EXAM: CHEST - 1 VIEW  COMPARISON:  12/08/2013  FINDINGS: Complete opacification of the left hemi thorax is without change. This is likely a large effusion. There is no evidence of volume loss. Mediastinal structures are mildly displaced  to the right supporting large pleural  effusion.  Increased vascularity is noted in the right lung accentuated by the supine technique. No right lung consolidation or overt edema.  Bony thorax is extensively demineralized.  IMPRESSION: No significant change from the prior study. Large left pleural effusion opacifies the left hemi thorax. No convincing pulmonary edema or infiltrate in the right lung.   Electronically Signed   By: Amie Portland M.D.   On: 02/16/2014 20:44   Dg Hip Complete Left  02/16/2014   CLINICAL DATA:  Fall, left hip pain  EXAM: LEFT HIP - COMPLETE 2+ VIEW  COMPARISON:  02/13/2013  FINDINGS: Hips are located.  No pelvic fracture sacral fracture.  There is a curvilinear lucency extending from the greater trochanter towards the lesser trochanter of the left hip. This persists on 2 images. The hip is internally rotated on both images which limits evaluation.  IMPRESSION: Findings most consistent with left intertrochanteric femur fracture.   Electronically Signed   By: Genevive Bi M.D.   On: 02/16/2014 19:10   Dg Hip Operative Left  02/17/2014   CLINICAL DATA:  ORIF of intertrochanteric left femoral neck fracture.  EXAM: OPERATIVE LEFT HIP 1 VIEW  COMPARISON:  Preoperative left hip x-rays yesterday.  FINDINGS: Single spot image from the C-arm fluoroscopic device, AP image of the left hip is submitted for interpretation post operatively. Patient has undergone ORIF of the old intertrochanteric left femoral neck fracture with placement of and answer medullary nail into the femur and a compression screw across fracture. Alignment appears anatomic.  IMPRESSION: Anatomic alignment in the AP projection post ORIF of the intertrochanteric left femoral neck fracture.   Electronically Signed   By: Hulan Saas M.D.   On: 02/17/2014 14:11   Dg Chest Port 1 View  02/19/2014   CLINICAL DATA:  Effusion.  EXAM: PORTABLE CHEST - 1 VIEW  COMPARISON:  02/17/2014  FINDINGS: Unchanged complete opacification of the left chest. Mild rightward  shift of mediastinum is unchanged. No evidence of pneumothorax after recent thoracentesis. The right lung remains relatively well aerated.  Heart size is obscured by the pleural disease. Right heart borders are unchanged.  Osteopenia.  IMPRESSION: Unchanged complete opacification of the left chest related to a large pleural effusion.   Electronically Signed   By: Tiburcio Pea M.D.   On: 02/19/2014 06:38   US Thoracentesis Asp Pleural Space W/img Guide  02/17/2014   CLINICAL DATA:  Large left pleural effusion  EXAM: ULTRASOUND GUIDED left THORACENTESIS  COMPARISON:  None  PROCEDURE: An ultrasound guided thoracentesis was thoroughly discussed with the patient and questions answered. The benefits, risks, alternatives and complications were also discussed. The patient understands and wishes to proceed with the procedure. Written consent was obtained.  Ultrasound was performed to localize and mark an adequate pocket of fluid in the left chest. The area was then prepped and draped in the normal sterile fashion. 1% Lidocaine was used for local anesthesia. Under ultrasound guidance a 19 gauge Yueh catheter was introduced. Thoracentesis was performed. The catheter was removed and a dressing applied.  Complications:  None  FINDINGS: A total of approximately 1.3 L of yellow fluid was removed. A fluid sample wassent for laboratory analysis.  IMPRESSION: Successful ultrasound guided left thoracentesis yielding 1.3 L of pleural fluid.  Read by:  Robet Leu PAC-IR   Electronically Signed   By: Simonne Come M.D.   On: 02/17/2014 13:28         Subjective:  Patient denies fevers, chills, headache, chest pain, dyspnea, nausea, vomiting, diarrhea, abdominal pain, dysuria, hematuria   Objective: Filed Vitals:   02/19/14 0800 02/19/14 0903 02/19/14 1104 02/19/14 1140  BP:  94/51  93/47  Pulse:  87  60  Temp:  98.3 F (36.8 C)  97.3 F (36.3 C)  TempSrc:  Oral  Oral  Resp: Height:      Weight:       SpO2:  96%  97%    Intake/Output Summary (Last 24 hours) at 02/19/14 1257 Last data filed at 02/19/14 1200  Gross per 24 hour  Intake    900 ml  Output    800 ml  Net    100 ml   Weight change:  Exam:   General:  Pt is alert, follows commands appropriately, not in acute distress  HEENT: No icterus, No thrush,  [place]/AT  Cardiovascular: RRR, S1/S2, no rubs, no gallops  Respiratory: Diminished breath sounds on the left. Basilar crackles on the right base. No wheezing.  Abdomen: Soft/+BS, non tender, non distended, no guarding  Extremities: No edema, No lymphangitis, No petechiae, No rashes, no synovitis  Data Reviewed: Basic Metabolic Panel:  Recent Labs Lab 02/16/14 1804 02/17/14 0529 02/18/14 0527 02/19/14 0424  NA 135* 137 132* 133*  K 4.1 4.4 4.3 4.7  CL 93* 95* 93* 96  CO2 GLUCOSE 135* 138* 130* 99  BUN CREATININE 1.05 0.92 0.93 0.89  CALCIUM 9.4 9.2 8.7 8.6   Liver Function Tests:  Recent Labs Lab 02/17/14 1044  AST 14  ALT 9  ALKPHOS 92  BILITOT 1.1  PROT 6.6  ALBUMIN 3.3*   No results found for this basename: LIPASE, AMYLASE,  in the last 168 hours No results found for this basename: AMMONIA,  in the last 168 hours CBC:  Recent Labs Lab 02/16/14 1804 02/17/14 0529 02/18/14 0527 02/19/14 0424  WBC 12.2* 15.1* 15.7* 14.2*  NEUTROABS 9.5*  --   --   --   HGB 14.1 14.0 12.5* 11.8*  HCT 43.5 43.9 38.2* 36.8*  MCV 90.2 90.3 88.2 90.2  PLT 187 179 179 152   Cardiac Enzymes: No results found for this basename: CKTOTAL, CKMB, CKMBINDEX, TROPONINI,  in the last 168 hours BNP: No components found with this basename: POCBNP,  CBG:  Recent Labs Lab 02/18/14 2004 02/19/14 0046 02/19/14 0403 02/19/14 0759 02/19/14 1204  GLUCAP 117* 126* 94 97 277*    Recent Results (from the past 240 hour(s))  BODY FLUID CULTURE     Status: None   Collection Time    02/17/14 11:07 AM      Result Value Ref Range Status    Specimen Description PLEURAL   Final   Special Requests NONE   Final   Gram Stain     Final   Value: NO WBC SEEN     NO ORGANISMS SEEN     Performed at [facility]   Culture     Final   Value: NO GROWTH 2 DAYS     Performed at [facility]   Report Status PENDING   Incomplete  SURGICAL PCR SCREEN     Status: None   Collection Time    02/17/14 12:07 PM      Result Value Ref Range Status   MRSA, PCR NEGATIVE  NEGATIVE Final   Staphylococcus aureus NEGATIVE  NEGATIVE Final   Comment:  The Xpert SA Assay (FDA     approved for NASAL specimens     in patients over 55 years of age),     is one component of     a comprehensive surveillance     program.  Test performance has     been validated by [employer] for patients greater     than or equal to 67 year old.     It is not intended     to diagnose infection nor to     guide or monitor treatment.     Scheduled Meds: . antiseptic oral rinse  7 mL Mouth Rinse q12n4p  . aspirin EC  325 mg Oral Q breakfast  . insulin aspart  0-9 Units Subcutaneous 6 times per day  . latanoprost  1 drop Right Eye QHS  . timolol  1 drop Both Eyes BID   Continuous Infusions: . sodium chloride 10 mL/hr at 02/17/14 1640     Schae Cando, DO  [facility] Pager [phone]  If 7PM-7AM, please contact night-coverage [email or web address] Password [email or web address] 02/19/2014, 12:57 PM   LOS: 3 days

## 2014-02-19 NOTE — Evaluation (Signed)
Occupational Therapy Evaluation Patient Details Name: Ronald Ball MRN: [id number] DOB: 1914/01/11 Today's Date: 02/19/2014    History of Present Illness 80 y.o. male with h/o back pain, pleural effusion admitted with fall and L hip fx. s/p IM Nail 02/17/14.    Clinical Impression   Pt presents to OT with decreased I with ADL activity due to problems below. Pt will benefit from skilled OT at SNF to increase with ADL activity and return to PLOF  Follow Up Recommendations  SNF    Equipment Recommendations  None recommended by OT       Precautions / Restrictions Precautions Precautions: Fall Precaution Comments: h/o 3 falls in past year Restrictions Weight Bearing Restrictions: No Other Position/Activity Restrictions: WBAT LLE      Mobility (per PT note) Bed Mobility Overal bed mobility: Needs Assistance Bed Mobility: Supine to Sit     Supine to sit: +2 for physical assistance;Max assist     General bed mobility comments: assist to advance BLEs and raise trunk pt 25%  Transfers Overall transfer level: Needs assistance Equipment used: Rolling walker (2 wheeled)   Sit to Stand: +2 physical assistance;Max assist         General transfer comment: assist to rise pt 30%         ADL Overall ADL's : Needs assistance/impaired                                       General ADL Comments: Limited ADL eval performed. Pt supine in bed getting ready for lunch.  Explained role and importance of OT and therapy.  OT did speak to RN regarding lifting pt to chair this day.  Pt has very strong BUE which OT explained will be benefifical in his recovery.                 Pertinent Vitals/Pain Pain Score: 4  Pain Location: L hip Pain Descriptors / Indicators: Aching Pain Intervention(s): Monitored during session     Hand Dominance     Extremity/Trunk Assessment Upper Extremity Assessment Upper Extremity Assessment: Overall WFL for tasks assessed            Communication Communication Communication: HOH   Cognition Arousal/Alertness: Awake/alert Behavior During Therapy: WFL for tasks assessed/performed Overall Cognitive Status: Within Functional Limits for tasks assessed                     General Comments   Pt is agreeable to rehab.            Home Living Family/patient expects to be discharged to:: Private residence Living Arrangements: Spouse/significant other   Type of Home: House Home Access: Stairs to enter;Ramped entrance Entrance Stairs-Number of Steps: 1 Entrance Stairs-Rails: Can reach both Home Layout: Two level;Able to live on main level with bedroom/bathroom;Laundry or work area in basement Alternate Teacher, music of Steps: chair lift to basement             Home Equipment: Environmental consultant - 4 wheels;Cane - single point;Shower seat;Walker - 2 wheels          Prior Functioning/Environment Level of Independence: Needs assistance  Gait / Transfers Assistance Needed: h/o 3 falls in past year, walks with rollator, cane, or RW ADL's / Homemaking Assistance Needed: wife assists with bathing        OT Diagnosis: Generalized weakness   OT Problem List: Decreased  strength;Decreased activity tolerance;Decreased safety awareness   OT Treatment/Interventions: Self-care/ADL training;Patient/family education;DME and/or AE instruction    OT Goals(Current goals can be found in the care plan section) Acute Rehab OT Goals Time For Goal Achievement: 03/05/14 Potential to Achieve Goals: Good  OT Frequency: Min 2X/week   Barriers to D/C: Decreased caregiver support             End of Session Nurse Communication: Mobility status  Activity Tolerance: Patient tolerated treatment well Patient left: in bed;with family/visitor present   Time: 1610-9604 OT Time Calculation (min): 13 min Charges:  OT General Charges $OT Visit: 1 Procedure OT Evaluation $Initial OT Evaluation Tier I: 1  Procedure G-Codes:    Alba Cory 03/15/14, 12:51 PM

## 2014-02-20 DIAGNOSIS — I480 Paroxysmal atrial fibrillation: Secondary | ICD-10-CM

## 2014-02-20 DIAGNOSIS — I4891 Unspecified atrial fibrillation: Secondary | ICD-10-CM

## 2014-02-20 DIAGNOSIS — I4719 Other supraventricular tachycardia: Secondary | ICD-10-CM | POA: Diagnosis present

## 2014-02-20 DIAGNOSIS — I471 Supraventricular tachycardia: Secondary | ICD-10-CM

## 2014-02-20 LAB — BODY FLUID CULTURE
Culture: NO GROWTH
Gram Stain: NONE SEEN

## 2014-02-20 LAB — GLUCOSE, CAPILLARY
GLUCOSE-CAPILLARY: 130 mg/dL — AB (ref 70–99)
Glucose-Capillary: 123 mg/dL — ABNORMAL HIGH (ref 70–99)
Glucose-Capillary: 150 mg/dL — ABNORMAL HIGH (ref 70–99)
Glucose-Capillary: 150 mg/dL — ABNORMAL HIGH (ref 70–99)
Glucose-Capillary: 183 mg/dL — ABNORMAL HIGH (ref 70–99)
Glucose-Capillary: 191 mg/dL — ABNORMAL HIGH (ref 70–99)

## 2014-02-20 MED ORDER — METOPROLOL SUCCINATE ER 25 MG PO TB24: 25.0000 mg | ORAL_TABLET | Freq: Every day | ORAL | Status: DC

## 2014-02-20 MED ORDER — DOCUSATE SODIUM 100 MG PO CAPS
100.0000 mg | ORAL_CAPSULE | Freq: Two times a day (BID) | ORAL | Status: DC
  Administered 2014-02-20 – 2014-02-21 (×3): 100 mg via ORAL
  Filled 2014-02-20 (×4): qty 1

## 2014-02-20 MED ORDER — DSS 100 MG PO CAPS: 100.0000 mg | ORAL_CAPSULE | Freq: Two times a day (BID) | ORAL | Status: DC

## 2014-02-20 MED ORDER — METOPROLOL SUCCINATE ER 25 MG PO TB24
25.0000 mg | ORAL_TABLET | Freq: Every day | ORAL | Status: DC
  Administered 2014-02-20 – 2014-02-21 (×2): 25 mg via ORAL
  Filled 2014-02-20 (×2): qty 1

## 2014-02-20 MED ORDER — ENSURE COMPLETE PO LIQD: 237.0000 mL | Freq: Two times a day (BID) | ORAL | Status: DC

## 2014-02-20 MED ORDER — SENNA 8.6 MG PO TABS: 2.0000 | ORAL_TABLET | Freq: Every day | ORAL | Status: DC

## 2014-02-20 MED ORDER — HYDROCODONE-ACETAMINOPHEN 5-325 MG PO TABS: 1.0000 | ORAL_TABLET | Freq: Four times a day (QID) | ORAL | Status: DC | PRN

## 2014-02-20 MED ORDER — SENNA 8.6 MG PO TABS
2.0000 | ORAL_TABLET | Freq: Every day | ORAL | Status: DC
  Administered 2014-02-20 – 2014-02-21 (×2): 17.2 mg via ORAL
  Filled 2014-02-20 (×2): qty 2

## 2014-02-20 NOTE — Progress Notes (Signed)
Physical Therapy Treatment Patient Details Name: Ronald Ball MRN: [id number] DOB: 02/11/1914 Today's Date: 02/20/2014    History of Present Illness 80 y.o. male with h/o back pain, pleural effusion admitted with fall and L hip fx. s/p IM Nail 02/17/14.     PT Comments    Patient tolerated and performed standing  Much better today. Premedicated and did not c/o of pain.+  Follow Up Recommendations  SNF;Supervision/Assistance - 24 hour     Equipment Recommendations  None recommended by PT    Recommendations for Other Services       Precautions / Restrictions Precautions Precautions: Fall Precaution Comments: h/o 3 falls in past year Restrictions Other Position/Activity Restrictions: WBAT LLE    Mobility  Bed Mobility Overal bed mobility: Needs Assistance Bed Mobility: Supine to Sit;Sit to Supine     Supine to sit: +2 for physical assistance;Max assist;+2 for safety/equipment Sit to supine: +2 for safety/equipment;+2 for physical assistance;Max assist   General bed mobility comments: patient did  initiate and move R leg toward edge of bed, assistanc e for L  leg,  max. assist for trunk to upright positionpt= 25%, assist for trunk and legs back into bed, pt < 10%  Transfers Overall transfer level: Needs assistance Equipment used: Rolling walker (2 wheeled) Transfers: Sit to/from Stand Sit to Stand: +2 physical assistance;+2 safety/equipment;Max assist;Mod assist;From elevated surface         General transfer comment: patient  stood x 4 times from elevated bed, stood x 1 minute on 2 tries. cues for posture, Pt did move L leg  toward R  to side step , patient unable to actually take a step with R foot,  Ambulation/Gait                 Stairs            Wheelchair Mobility    Modified Rankin (Stroke Patients Only)       Balance Overall balance assessment: Needs assistance;History of Falls Sitting-balance support: Feet supported;Bilateral  upper extremity supported Sitting balance-Leahy Scale: Fair     Standing balance support: Bilateral upper extremity supported;During functional activity Standing balance-Leahy Scale: Poor                      Cognition Arousal/Alertness: Awake/alert Behavior During Therapy: WFL for tasks assessed/performed Overall Cognitive Status: Within Functional Limits for tasks assessed                      Exercises  Exercises Heel Slides: AAROM;AROM;Both;10 reps;Supine Hip ABduction/ADduction: AAROM;AROM;Both;10 reps;Supine General Exercises - Lower Extremity Ankle Circles/Pumps: AROM;Both;5 reps;Supine Long Arc Quad: AAROM;AROM;Both;10 reps;Seated Heel Slides: AAROM;Left;10 reps;Supine Hip ABduction/ADduction: AAROM;Left;10 reps;Supine    General Comments        Pertinent Vitals/Pain Pain Assessment: Faces Faces Pain Scale: Hurts a little bit Pain Location: L hip Pain Descriptors / Indicators: Aching Pain Intervention(s): Monitored during session;Premedicated before session    Home Living                      Prior Function            PT Goals (current goals can now be found in the care plan section) Progress towards PT goals: Progressing toward goals    Frequency  Min 3X/week    PT Plan Current plan remains appropriate    Co-evaluation             End of Session  Equipment Utilized During Treatment: Gait belt Activity Tolerance: Patient tolerated treatment well Patient left: in bed;with call bell/phone within reach;with nursing/sitter in room;with family/visitor present     Time: 4098-1191 PT Time Calculation (min): 37 min  Charges:  $Therapeutic Exercise: 8-22 mins $Therapeutic Activity: 8-22 mins                    G Codes:      Rada Hay 02/20/2014, 3:55 PM Blanchard Kelch PT [phone]

## 2014-02-20 NOTE — Discharge Summary (Signed)
Physician Discharge Summary  Ronald Ball:[id number] DOB: 07/03/1913 DOA: 02/16/2014  PCP: Eustaquio Boyden, MD  Admit date: 02/16/2014 Discharge date: 02/21/14 Recommendations for Outpatient Follow-up:  1. Pt will need to follow up with PCP in 2 weeks post discharge 2. Please obtain BMP and CBC in one week 3. Keep patient on 2L [place], wean oxygen for  oxygen saturation > 92% 4. After for 4 additional weeks of aspirin 325 mg daily, aspirin 81 mg daily can be started  Discharge Diagnoses:  Acute respiratory failure  -Secondary to pleural effusion  -Supplemental oxygen--remains stable on 2L  Pleural effusion  -02/17/14--IR thoracocentesis--1.3L  -Pleural fluid appears exudative  -consulted pulmonary--spoke with Dr. Craige Cotta  -Send pleural fluid for cell count--WBC 12, glucose 29  -Pleural fluid culture--negative to date; Cytology pending  -pleural fluid ANA--pending  -Discussed with patient and family at the bedside--they did not wish to pursue any further invasive at this time as the patient is stable without any distress  -appreciate pulmonary followup--no further invasive  procedures as long as the patient remains clinically stable--would consider Pleurx catheter if develops increasing sob or hypoxemia -echo--limited but normal EF  Left intertrochanteric femur fracture  -Appreciate orthopedic surgery  -PT/OT--->SNF  -DVT prophylaxis per ortho--recommended ASA 325 mg daily  -02/17/14--L-IM nail placed by Dr. Lajoyce Corners Paroxysmal Afib with RVR -The patient also had some paroxysmal episodes of atrial tachycardia -Patient had intermittent episodes of paroxysmal atrial fibrillation during admission which spontaneously converted to sinus rhythm -Cardiology was consulted and did not recommend any further workup at this time  -Metoprolol succinate 25 mg daily was added -The patient should continue on aspirin 325 mg daily (for hip surgery) for the next 4 weeks, then switched to aspirin 81 mg  daily -Echocardiogram was limited during the admission, but revealed normal EF -The patient is in sinus rhythm at the time of discharge Hypertension  -Stable off of lisinopril/HCTZ--BP soft  -Continue to monitor  -Metoprolol succinate 25 mg daily was started for the patient's atrial fibrillation  Diabetes mellitus type 2  -02/17/2014 hemoglobin A1c 7.3  -d/c CBGs and sliding scale  Leukocytosis  -Likely stress demargination  -Afebrile and hemodynamically stable  Family Communication: Wife and sons updated at beside  Disposition Plan: Likely snf when medically stable   Discharge Condition:  stable Disposition: SNF--[facility] Follow-up Information   Follow up with DUDA,MARCUS V, MD In 2 weeks.   Specialty:  Orthopedic Surgery   Contact information:   [address] [place] [address] [place] [address] [phone]       Diet:regular Wt Readings from Last 3 Encounters:  02/16/14 82.101 kg (181 lb)  02/16/14 82.101 kg (181 lb)  01/11/14 84.596 kg (186 lb 8 oz)    History of present illness:  80 year old male with a history of hypertension, osteoarthritis, lumbar compression fracture, presented after a mechanical fall trying to pick up his back brace. This resulted in a left intertrochanteric femur fracture. During the initial evaluation in the emergency department, the patient was noted to have complete opacification of his left hemithorax consistent with a large left pleural effusion. He was noted to have oxygen saturation of 87% on RA and he was placed on 2L [place]. This is been present since January 2015, but the patient had been refusing further workup. The patient denies any fevers, chills, chest pain, shortness breath, dizziness, nausea, vomiting. He denies any hemoptysis. The patient has never smoked. He denies any previous history of CHF, stroke or MI. At home, the patient  normally gets around with a rolling walker. Dr. Lajoyce Corners placed L-IM nail on 02/17/14. IR was consulted for  thoracocentesis--1.3L was removed, but pt with residual large pleural effusion. Pulmonary medicine was consulted. After discussion with the patient's family and the patient, and the decision was made to hold off on any further invasive procedures regarding the patient's left pleural effusion unless the patient decompensates. As the patient is presently stable without any respiratory distress or shortness of breath, the patient and the family decided against any further interventions at this time. In addition, the patient developed paroxysmal episodes of atrial fibrillation with RVR. He spontaneously converted to sinus rhythm. Cardiology was consulted. They recommended metoprolol succinate 25 mg daily without any further workup at this time. The patient remains in sinus rhythm and clinically stable at time of d/c      Consultants: Ortho--[facility]--[facility]  Discharge Exam: Filed Vitals:   02/20/14 1437  BP: 107/54  Pulse: 85  Temp: 97.8 F (36.6 C)  Resp: 18   Filed Vitals:   02/20/14 0915 02/20/14 1200 02/20/14 1201 02/20/14 1437  BP: 105/51  105/52 107/54  Pulse: 64  80 85  Temp: 97.7 F (36.5 C)   97.8 F (36.6 C)  TempSrc: Oral   Oral  Resp: Height:      Weight:      SpO2: 98%   98%   General: A&O x 3, NAD, pleasant, cooperative Cardiovascular: RRR, no rub, no gallop, no S3 Respiratory: CTAB, no wheeze, no rhonchi Abdomen:soft, nontender, nondistended, positive bowel sounds Extremities: No edema, No lymphangitis, no petechiae  Discharge Instructions      Discharge Instructions   Diet - low sodium heart healthy    Complete by:  As directed      Increase activity slowly    Complete by:  As directed      Weight bearing as tolerated    Complete by:  As directed             Medication List    STOP taking these medications       lisinopril-hydrochlorothiazide 10-12.5 MG per tablet  Commonly known as:  PRINZIDE,ZESTORETIC       TAKE these medications       acetaminophen 500 MG tablet  Commonly known as:  TYLENOL  Take 1 tablet (500 mg total) by mouth every 6 (six) hours as needed for mild pain.     aspirin 325 MG EC tablet  Take 1 tablet (325 mg total) by mouth daily with breakfast.     DSS 100 MG Caps  Take 100 mg by mouth 2 (two) times daily.     feeding supplement (ENSURE COMPLETE) Liqd  Take 237 mLs by mouth 2 (two) times daily between meals.     HYDROcodone-acetaminophen 5-325 MG per tablet  Commonly known as:  NORCO/VICODIN  Take 1-2 tablets by mouth every 6 (six) hours as needed for moderate pain.     latanoprost 0.005 % ophthalmic solution  Commonly known as:  XALATAN  Place 1 drop into the right eye at bedtime.     magnesium hydroxide 400 MG/5ML suspension  Commonly known as:  MILK OF MAGNESIA  Take 45 mLs by mouth daily as needed for mild constipation.     metoprolol succinate 25 MG 24 hr tablet  Commonly known as:  TOPROL-XL  Take 1 tablet (25 mg total) by mouth daily.     REFRESH PLUS OP  Apply 1  drop to eye 3 (three) times daily as needed (dry eyes). Both eyes     senna 8.6 MG Tabs tablet  Commonly known as:  SENOKOT  Take 2 tablets (17.2 mg total) by mouth daily.     timolol 0.5 % ophthalmic solution  Commonly known as:  TIMOPTIC  Place 1 drop into both eyes 2 (two) times daily.         The results of significant diagnostics from this hospitalization (including imaging, microbiology, ancillary and laboratory) are listed below for reference.    Significant Diagnostic Studies: Dg Chest 1 View  02/17/2014   CLINICAL DATA:  Status post left thoracentesis.  EXAM: CHEST - 1 VIEW  COMPARISON:  02/16/2014  FINDINGS: Cardiac silhouette remains largely obscured. There is persistent complete opacification of the left hemithorax. Rightward mediastinal shift may have minimally improved, although direct comparison is limited by patient rotation on the prior study. Pulmonary vascular  congestion in the right lung has mildly improved. No right lung consolidation is seen. No pneumothorax is identified.  IMPRESSION: Persistent complete opacification of the left hemithorax, consistent with large pleural effusion. Rightward mediastinal shift may have slightly improved following thoracentesis. No pneumothorax identified.   Electronically Signed   By: Sebastian Ache   On: 02/17/2014 12:17   Dg Chest 1 View  02/16/2014   CLINICAL DATA:  Fall.  Hip fracture.  EXAM: CHEST - 1 VIEW  COMPARISON:  12/08/2013  FINDINGS: Complete opacification of the left hemi thorax is without change. This is likely a large effusion. There is no evidence of volume loss. Mediastinal structures are mildly displaced to the right supporting large pleural effusion.  Increased vascularity is noted in the right lung accentuated by the supine technique. No right lung consolidation or overt edema.  Bony thorax is extensively demineralized.  IMPRESSION: No significant change from the prior study. Large left pleural effusion opacifies the left hemi thorax. No convincing pulmonary edema or infiltrate in the right lung.   Electronically Signed   By: Amie Portland M.D.   On: 02/16/2014 20:44   Dg Hip Complete Left  02/16/2014   CLINICAL DATA:  Fall, left hip pain  EXAM: LEFT HIP - COMPLETE 2+ VIEW  COMPARISON:  02/13/2013  FINDINGS: Hips are located.  No pelvic fracture sacral fracture.  There is a curvilinear lucency extending from the greater trochanter towards the lesser trochanter of the left hip. This persists on 2 images. The hip is internally rotated on both images which limits evaluation.  IMPRESSION: Findings most consistent with left intertrochanteric femur fracture.   Electronically Signed   By: Genevive Bi M.D.   On: 02/16/2014 19:10   Dg Hip Operative Left  02/17/2014   CLINICAL DATA:  ORIF of intertrochanteric left femoral neck fracture.  EXAM: OPERATIVE LEFT HIP 1 VIEW  COMPARISON:  Preoperative left hip x-rays  yesterday.  FINDINGS: Single spot image from the C-arm fluoroscopic device, AP image of the left hip is submitted for interpretation post operatively. Patient has undergone ORIF of the old intertrochanteric left femoral neck fracture with placement of and answer medullary nail into the femur and a compression screw across fracture. Alignment appears anatomic.  IMPRESSION: Anatomic alignment in the AP projection post ORIF of the intertrochanteric left femoral neck fracture.   Electronically Signed   By: Hulan Saas M.D.   On: 02/17/2014 14:11   Dg Chest Port 1 View  02/19/2014   CLINICAL DATA:  Effusion.  EXAM: PORTABLE CHEST - 1 VIEW  COMPARISON:  02/17/2014  FINDINGS: Unchanged complete opacification of the left chest. Mild rightward shift of mediastinum is unchanged. No evidence of pneumothorax after recent thoracentesis. The right lung remains relatively well aerated.  Heart size is obscured by the pleural disease. Right heart borders are unchanged.  Osteopenia.  IMPRESSION: Unchanged complete opacification of the left chest related to a large pleural effusion.   Electronically Signed   By: Tiburcio Pea M.D.   On: 02/19/2014 06:38   US Thoracentesis Asp Pleural Space W/img Guide  02/17/2014   CLINICAL DATA:  Large left pleural effusion  EXAM: ULTRASOUND GUIDED left THORACENTESIS  COMPARISON:  None  PROCEDURE: An ultrasound guided thoracentesis was thoroughly discussed with the patient and questions answered. The benefits, risks, alternatives and complications were also discussed. The patient understands and wishes to proceed with the procedure. Written consent was obtained.  Ultrasound was performed to localize and mark an adequate pocket of fluid in the left chest. The area was then prepped and draped in the normal sterile fashion. 1% Lidocaine was used for local anesthesia. Under ultrasound guidance a 19 gauge Yueh catheter was introduced. Thoracentesis was performed. The catheter was removed and  a dressing applied.  Complications:  None  FINDINGS: A total of approximately 1.3 L of yellow fluid was removed. A fluid sample wassent for laboratory analysis.  IMPRESSION: Successful ultrasound guided left thoracentesis yielding 1.3 L of pleural fluid.  Read by:  Robet Leu PAC-IR   Electronically Signed   By: Simonne Come M.D.   On: 02/17/2014 13:28     Microbiology: Recent Results (from the past 240 hour(s))  BODY FLUID CULTURE     Status: None   Collection Time    02/17/14 11:07 AM      Result Value Ref Range Status   Specimen Description PLEURAL   Final   Special Requests NONE   Final   Gram Stain     Final   Value: NO WBC SEEN     NO ORGANISMS SEEN     Performed at [facility]   Culture     Final   Value: NO GROWTH 3 DAYS     Performed at [facility]   Report Status 02/20/2014 FINAL   Final  SURGICAL PCR SCREEN     Status: None   Collection Time    02/17/14 12:07 PM      Result Value Ref Range Status   MRSA, PCR NEGATIVE  NEGATIVE Final   Staphylococcus aureus NEGATIVE  NEGATIVE Final   Comment:            The Xpert SA Assay (FDA     approved for NASAL specimens     in patients over 20 years of age),     is one component of     a comprehensive surveillance     program.  Test performance has     been validated by [employer] for patients greater     than or equal to 64 year old.     It is not intended     to diagnose infection nor to     guide or monitor treatment.     Labs: Basic Metabolic Panel:  Recent Labs Lab 02/16/14 1804 02/17/14 0529 02/18/14 0527 02/19/14 0424  NA 135* 137 132* 133*  K 4.1 4.4 4.3 4.7  CL 93* 95* 93* 96  CO2 GLUCOSE 135* 138* 130* 99  BUN CREATININE 1.05 0.92 0.93 0.89  CALCIUM 9.4 9.2 8.7 8.6   Liver Function Tests:  Recent Labs Lab 02/17/14 1044  AST 14  ALT 9  ALKPHOS 92  BILITOT 1.1  PROT 6.6  ALBUMIN 3.3*   No results found for this basename: LIPASE, AMYLASE,   in the last 168 hours No results found for this basename: AMMONIA,  in the last 168 hours CBC:  Recent Labs Lab 02/16/14 1804 02/17/14 0529 02/18/14 0527 02/19/14 0424  WBC 12.2* 15.1* 15.7* 14.2*  NEUTROABS 9.5*  --   --   --   HGB 14.1 14.0 12.5* 11.8*  HCT 43.5 43.9 38.2* 36.8*  MCV 90.2 90.3 88.2 90.2  PLT 187 179 179 152   Cardiac Enzymes: No results found for this basename: CKTOTAL, CKMB, CKMBINDEX, TROPONINI,  in the last 168 hours BNP: No components found with this basename: POCBNP,  CBG:  Recent Labs Lab 02/19/14 2010 02/20/14 0010 02/20/14 0415 02/20/14 0739 02/20/14 1210  GLUCAP 167* 150* 123* 130* 150*    Time coordinating discharge:  Greater than 30 minutes  Signed:  Tiara Maultsby, DO [facility] Pager: [phone] 02/20/2014, 4:27 PM

## 2014-02-20 NOTE — Progress Notes (Signed)
Patient is set to discharge to [facility] SNF today. Patient & family at bedside aware. Discharge packet given to RN, Toma Copier. [facility] Medicare insurance authorization obtained. [facility] called for transport.   Clinical Social Work Department CLINICAL SOCIAL WORK PLACEMENT NOTE 02/21/2014  Patient:  Ronald Ball, Ronald Ball  Account Number:  [id number] Admit date:  02/16/2014  Clinical Social Worker:  Robin Searing  Date/time:  02/18/2014 03:55 PM  Clinical Social Work is seeking post-discharge placement for this patient at the following level of care:   SKILLED NURSING   (*CSW will update this form in Epic as items are completed)   02/18/2014  Patient/family provided with [facility] Department of Clinical Social Work's list of facilities offering this level of care within the geographic area requested by the patient (or if unable, by the patient's family).  02/18/2014  Patient/family informed of their freedom to choose among providers that offer the needed level of care, that participate in Medicare, Medicaid or managed care program needed by the patient, have an available bed and are willing to accept the patient.  02/18/2014  Patient/family informed of [facility]' ownership interest in [facility], as well as of the fact that they are under no obligation to receive care at this facility.  PASARR submitted to EDS on 02/18/2014 PASARR number received on 02/18/2014  FL2 transmitted to all facilities in geographic area requested by pt/family on  02/18/2014 FL2 transmitted to all facilities within larger geographic area on   Patient informed that his/her managed care company has contracts with or will negotiate with  certain facilities, including the following:     Patient/family informed of bed offers received:  02/19/2014 Patient chooses bed at [facility] Physician recommends and patient chooses bed at    Patient to be transferred to [facility]  on  02/21/2014 Patient to be transferred to facility by [facility] Patient and family notified of transfer on 02/21/2014 Name of family member notified:  patient's son, [name] at bedside  The following physician request were entered in Epic:   Additional Comments:   Lincoln Maxin, LCSW [facility] Clinical Social Worker cell #: [phone]

## 2014-02-20 NOTE — Progress Notes (Signed)
Patient's HR suddenly went up to 120-150's, became irregular. Converted to Atrial fib from NSR, confirmed with EKG.  Patient was asymptomatic, resting in bed. Informed Merdis Delay NP. Patient's HR sustained up until 2230 when his rhythm coverted back to NSR. No medicine was given yet, cardizem was discontinued. We will continue to monitor patient.

## 2014-02-20 NOTE — Consult Note (Addendum)
Admit date: 02/16/2014 Referring Physician  Dr. Arbutus Leas Primary Physician Eustaquio Boyden, MD Primary Cardiologist  none Reason for Consultation  AFIB  HPI: 80 year old male with left intertrochanteric fracture status post intramedullary nail with postoperative atrial fibrillation/paroxysmal atrial tachycardia. Left-sided pleural effusion has been tapped as well secondary hypoxia.  Effusion appeared exudative from review of note, family does not wish any further invasive procedures performed.    PMH:   Past Medical History  Diagnosis Date  . BPH (benign prostatic hyperplasia)   . Diverticulosis of colon   . History of barium enema 07/1991    Diverticuli  . History of CT scan of abdomen 08/14/05    abd/pelvis with and without R adrenal adenoma-stable  . Hyperlipidemia   . Glaucoma   . Hypertension   . Chronic back pain 2007    MRI scan of 03/15/06 revealed severe central canal stenosis at L4-5. He had extensive facet hypertrophy bilaterally. s/p ESI  . Constipation 11/2013    ER visit with miralax treatment  . Diabetes mellitus     02/16/14 - family denies  . Intertrochanteric fracture of left hip 01/2014  . Pleural effusion on left 2015    pt had declined further eval in past    PSH:   Past Surgical History  Procedure Laterality Date  . Prostate biopsy  07/13/98    benign, cytoscopy wnl  . Shoulder surgery Right 04/03    [facility]  . Cataract extraction Left 04/01  . Cataract extraction Right 01/04   . Intramedullary (im) nail intertrochanteric Left 01/2014    [facility]  . Intramedullary (im) nail intertrochanteric Left 02/17/2014    Procedure: INTRAMEDULLARY (IM) NAIL INTERTROCHANTRIC;  Surgeon: Nadara Mustard, MD;  Location: [facility];  Service: Orthopedics;  Laterality: Left;   Allergies:  Atenolol; Esomeprazole magnesium; and Ofloxacin Prior to Admit Meds:   Prior to Admission medications   Medication Sig Start Date End Date Taking? Authorizing Provider  acetaminophen  (TYLENOL) 500 MG tablet Take 500-1,000 mg by mouth daily as needed for mild pain.   Yes Historical Provider, MD  aspirin EC 81 MG tablet Take 81 mg by mouth daily.   Yes Historical Provider, MD  Carboxymethylcellulose Sodium (REFRESH PLUS OP) Apply 1 drop to eye 3 (three) times daily as needed (dry eyes). Both eyes   Yes Historical Provider, MD  latanoprost (XALATAN) 0.005 % ophthalmic solution Place 1 drop into the right eye at bedtime.    Yes Historical Provider, MD  lisinopril-hydrochlorothiazide (PRINZIDE,ZESTORETIC) 10-12.5 MG per tablet Take 1 tablet by mouth daily.   Yes Historical Provider, MD  magnesium hydroxide (MILK OF MAGNESIA) 400 MG/5ML suspension Take 45 mLs by mouth daily as needed for mild constipation.   Yes Historical Provider, MD  timolol (TIMOPTIC) 0.5 % ophthalmic solution Place 1 drop into both eyes 2 (two) times daily.     Yes Historical Provider, MD  acetaminophen (TYLENOL) 500 MG tablet Take 1 tablet (500 mg total) by mouth every 6 (six) hours as needed for mild pain. 02/17/14   Nadara Mustard, MD  aspirin EC 325 MG EC tablet Take 1 tablet (325 mg total) by mouth daily with breakfast. 02/19/14   Catarina Hartshorn, MD  HYDROcodone-acetaminophen (NORCO/VICODIN) 5-325 MG per tablet Take 1-2 tablets by mouth every 6 (six) hours as needed for moderate pain. 02/19/14   Catarina Hartshorn, MD   Fam HX:    Family History  Problem Relation Age of Onset  . Cancer Mother  stomach  and colon  . Cancer Brother     possible lung cancer   Social HX:    History   Social History  . Marital Status: Married    Spouse Name: N/A    Number of Children: 3  . Years of Education: N/A   Occupational History  . 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 Smoking status: Never Smoker   . Smokeless tobacco: Never Used  . Alcohol Use: No  . Drug Use: No  . Sexual Activity: Not on file   Other Topics Concern  . Not on file   Social History Narrative   3 sons, local   Married  1940   Retired Curator     ROS:  Positive shortness of breath, tachycardia, hip pain. All 11 ROS were addressed and are negative except what is stated in the HPI   Physical Exam: Blood pressure 117/60, pulse 84, temperature 97.7 F (36.5 C), temperature source Oral, resp. rate 18, height  (1.803 m), weight 181 lb (82.101 kg), SpO2 98.00%.   General: Elderly, looks younger than stated age, in no acute distress Head: Eyes PERRLA, No xanthomas.   Normal cephalic and atramatic  Lungs:  Decreased BS LLL.  Heart:   HRRR S1 S2 Pulses are 2+ & equal. No obvious murmur, rubs, gallops.  No carotid bruit. No JVD.  No abdominal bruits.  Abdomen: Bowel sounds are positive, abdomen soft and non-tender without masses. No hepatosplenomegaly. Msk:  Unable to assess strength and tone. Extremities:  No clubbing, cyanosis or edema.  DP +1 Neuro: Alert and oriented X 3, non-focal, MAE x 4 GU: Deferred Rectal: Deferred Psych:  Good affect, responds appropriately      Labs: Lab Results  Component Value Date   WBC 14.2* 02/19/2014   HGB 11.8* 02/19/2014   HCT 36.8* 02/19/2014   MCV 90.2 02/19/2014   PLT 152 02/19/2014     Recent Labs Lab 02/17/14 1044  02/19/14 0424  NA  --   < > 133*  K  --   < > 4.7  CL  --   < > 96  CO2  --   < > 26  BUN  --   < > 21  CREATININE  --   < > 0.89  CALCIUM  --   < > 8.6  PROT 6.6  --   --   BILITOT 1.1  --   --   ALKPHOS 92  --   --   ALT 9  --   --   AST 14  --   --   GLUCOSE  --   < > 99  < > = values in this interval not displayed. No results found for this basename: CKTOTAL, CKMB, TROPONINI,  in the last 72 hours Lab Results  Component Value Date   CHOL 182 07/02/2010   HDL 41.20 07/02/2010   LDLCALC 115* 07/02/2010   TRIG 128.0 07/02/2010      Radiology:  Dg Chest Port 1 View  02/19/2014   CLINICAL DATA:  Effusion.  EXAM: PORTABLE CHEST - 1 VIEW  COMPARISON:  02/17/2014  FINDINGS: Unchanged complete opacification of the left chest. Mild  rightward shift of mediastinum is unchanged. No evidence of pneumothorax after recent thoracentesis. The right lung remains relatively well aerated.  Heart size is obscured by the pleural disease. Right heart borders are unchanged.  Osteopenia.  IMPRESSION: Unchanged complete opacification of the left  chest related to a large pleural effusion.   Electronically Signed   By: Tiburcio Pea M.D.   On: 02/19/2014 06:38   Personally viewed.  EKG:  02/18/14-does appear to be atrial fibrillation with rapid ventricular response. Otherwise telemetry reviewed most recently appears to be paroxysmal atrial tachycardia. Personally viewed.   ASSESSMENT/PLAN:    80 year old with postoperative paroxysmal atrial tachycardia/  postop atrial fibrillation, left intertrochanteric fracture, intermedullary nail on 02/17/14 with large left pneumothorax status post thoracentesis 1.3 L, hypoxia on admission, pulmonary consultation, family discussion for holding off of any further invasive procedures.  1. Paroxysmal atrial tachycardia/postop atrial fibrillation-hyperadrenergic tone, pleural effusion likely driving this event as well as advanced age. I reviewed telemetry and tachycardia, with recent occurrence is paroxysmal atrial tachycardia. There also PAC's intermittently causing irregular appearance to rhythm. EKG from 02/18/14 does appear to be atrial fibrillation however. Regardless, treatment should consist of either beta blocker or calcium channel blocker to help suppress tachycardia. Often, in postoperative state, low-dose beta blocker will be helpful. I will start metoprolol 25 mg extended release once daily. Hold if systolic blood pressure less than 90. He did have atenolol listed as allergy with? Side effect. He is currently in sinus rhythm and should be able to tolerate low-dose beta blocker. Family is NOT aware of a significant allergy.  He is not a candidate for long-term anticoagulation given his high bleeding risk.  Family agrees (Son, wife of patient).  LV systolic function grossly appeared normal on echocardiogram although this was a very challenging/difficult study with poor windows.  Currently in NSR 80's on tele. Later today if tolerating metoprolol well, I would be comfortable with DC from cardiac perspective.   Donato Schultz, MD  02/20/2014  9:43 AM

## 2014-02-20 NOTE — Progress Notes (Signed)
Comments:  Notified by RN that pt HR 130's and at times 140's. Pt asymptomatic. A-Fib w/ RVR confirmed by EKG. EKG on 02/18/14 also revealed A-Fib w/ RVR w/ a rate of 139, but unable to find any documented h/o A-Fib. He is on ASA 325 mg qd. SBP has been in the 90's most of the day. The plan was to start Cardizem 30 mg PO q8h in an attempt to control rate but before med arrived to floor pt spontaneously converted back to SR w/ rate in the 80's. Will continue to monitor closely.   Leanne Chang, NP-C [facility] Pager [phone]

## 2014-02-21 ENCOUNTER — Inpatient Hospital Stay
Admission: RE | Admit: 2014-02-21 | Discharge: 2014-03-27 | Disposition: A | Discharge status: Home / Self Care | Payer: Medicare PPO | Source: Ambulatory Visit | Attending: Internal Medicine | Admitting: Internal Medicine

## 2014-02-21 ENCOUNTER — Other Ambulatory Visit: Payer: Self-pay | Admitting: *Deleted

## 2014-02-21 DIAGNOSIS — R05 Cough: Principal | ICD-10-CM

## 2014-02-21 DIAGNOSIS — R059 Cough, unspecified: Principal | ICD-10-CM

## 2014-02-21 LAB — ANA, BODY FLUID: ANTI-NUCLEAR AB, IGG: NOT DETECTED

## 2014-02-21 MED ORDER — MAGNESIUM CITRATE PO SOLN
1.0000 | Freq: Once | ORAL | Status: AC
  Administered 2014-02-21: 1 via ORAL

## 2014-02-21 MED ORDER — HYDROCODONE-ACETAMINOPHEN 5-325 MG PO TABS: ORAL_TABLET | ORAL | Status: DC

## 2014-02-21 MED ORDER — BISACODYL 10 MG RE SUPP: 10.0000 mg | Freq: Once | RECTAL | Status: DC

## 2014-02-21 NOTE — Telephone Encounter (Signed)
[facility] 

## 2014-02-21 NOTE — Progress Notes (Addendum)
Patient seen and assessed.  He denies acute complaints but states he is constipated.  VSS during my exam.  GEN:  WM, NAD, MMM, NCAT, RRR currently (intermittent a-fib on tele), no mrg, CTAB, no increased WOB, NABS, soft, ND/NT, trace ankle edema bilaterally.  Small healing bruise on the left knee.  Left lateral hip dressings without blood, minimal bruising and swelling.  Given magnesium citrate, otherwise no change.  Patient stable to transfer to SNF later today.

## 2014-02-23 ENCOUNTER — Non-Acute Institutional Stay (SKILLED_NURSING_FACILITY): Payer: Medicare PPO | Admitting: Internal Medicine

## 2014-02-23 ENCOUNTER — Encounter: Payer: Self-pay | Admitting: Internal Medicine

## 2014-02-23 DIAGNOSIS — S72002D Fracture of unspecified part of neck of left femur, subsequent encounter for closed fracture with routine healing: Secondary | ICD-10-CM

## 2014-02-23 DIAGNOSIS — E119 Type 2 diabetes mellitus without complications: Secondary | ICD-10-CM

## 2014-02-23 DIAGNOSIS — I10 Essential (primary) hypertension: Secondary | ICD-10-CM

## 2014-02-23 DIAGNOSIS — S72009D Fracture of unspecified part of neck of unspecified femur, subsequent encounter for closed fracture with routine healing: Secondary | ICD-10-CM

## 2014-02-23 DIAGNOSIS — J90 Pleural effusion, not elsewhere classified: Secondary | ICD-10-CM

## 2014-02-23 DIAGNOSIS — I4891 Unspecified atrial fibrillation: Secondary | ICD-10-CM

## 2014-02-23 DIAGNOSIS — I480 Paroxysmal atrial fibrillation: Secondary | ICD-10-CM

## 2014-02-23 NOTE — Progress Notes (Signed)
Patient ID: Ronald Ball, male   DOB: [birth year], 80 y.o.   MRN: [id number]   This is an acute visit.  Level of care skilled.  Facility [facility].  Chief complaint acute visit status post hospitalization for acute respiratory failure-pleural effusion-left femur fracture.  History of present illness very  Patient is a pleasant 80 year old male with a history of hypertension osteoarthritis lumbar compression fractures.  Apparently had a fall while trying to pick up his back brace-this resulted in a left hip fracture-initial workup also showed a left large pleural effusion-with an oxygen saturation of 87% on room air-he was put on oxygen.  Apparently the effusion has been present since January 2015 the patient had refused further workup.  In regard to the hip fracture this was surgically repaired with an IM nailing-.  He also had a thoracentesis in regards of pleural effusion--over a liter was removed but he remains with a residual large pleural effusion-this was discussed with patient and his family the decision was made to hold off on any further procedures unless patient decompensates.  Patient also developed episodes of atrial fibrillation with rapid ventricular rate he spontaneously converted to sinus rhythm.  Cardiology was consulted and they recommended starting metoprolol 25 mg a day with normal further workup at this time.  Patient has been married apparently for 75 years and lives with his wife-    .  Past Medical History:  Past Medical History   Diagnosis  Date   .  BPH (benign prostatic hyperplasia)    .  Diverticulosis of colon    .  History of barium enema  07/1991     Diverticuli   .  History of CT scan of abdomen  08/14/05     abd/pelvis with and without R adrenal adenoma-stable   .  Hyperlipidemia    .  Glaucoma    .  Hypertension    .  Chronic back pain  2007     MRI scan of 03/15/06 revealed severe central canal stenosis at L4-5. He had extensive facet  hypertrophy bilaterally. s/p ESI   .  Constipation  11/2013     ER visit with miralax treatment   .  Diabetes mellitus      02/16/14 - family denies    Past Surgical History   Procedure  Laterality  Date   .  Prostate biopsy   07/13/98     benign, cytoscopy wnl   .  Shoulder surgery  Right  04/03     [facility]   .  Cataract extraction  Left  04/01   .  Cataract extraction  Right  01/04     Past Medical History:  Past Medical History   Diagnosis  Date   .  BPH (benign prostatic hyperplasia)    .  Diverticulosis of colon    .  History of barium enema  07/1991     Diverticuli   .  History of CT scan of abdomen  08/14/05     abd/pelvis with and without R adrenal adenoma-stable   .  Hyperlipidemia    .  Glaucoma    .  Hypertension    .  Chronic back pain  2007     MRI scan of 03/15/06 revealed severe central canal stenosis at L4-5. He had extensive facet hypertrophy bilaterally. s/p ESI   .  Constipation  11/2013     ER visit with miralax treatment   .  Diabetes mellitus  02/16/14 - family denies    Past Surgical History   Procedure  Laterality  Date   .  Prostate biopsy   07/13/98     benign, cytoscopy wnl   .  Shoulder surgery  Right  04/03     [facility]   .  Cataract extraction  Left  04/01   .  Cataract extraction  Right  01/04     Social history-patient has been married for 75 years lives fairly independently with his wife-denies any history of tobacco alcohol or illicit drug use   Review of systems.  In general does not complain of fever or chills.  Skin mass does not complaining of any rashes or itching surgical site looks fairly benign.  Head ears eyes nose mouth and throat-does not complaining of visual changes or sore throat nursing staff has noted a slight film on his tongue.  Respiratory he is not complaining of shortness of breath has an occasional cough.  Cardiac does not complaining of chest pain as minimal edema.  GI is not complaining of  abdominal discomfort nausea vomiting diarrhea or constipation.  GU has a history of BPH does not complaining of dysuria however.  Muscle skeletal is complaining of some leg and hip discomfort.  Neurologic is not complaining of dizziness headache or numbness.  Psych appears to be in good spirits does not complaining of anxiety.  Physical exam.  Temperature 98.0 pulse 85 respirations 20 blood pressure 148/83.--O2 saturation is in the 90s on 2 L oxygen  General this is a pleasant elderly male who looks younger than his stated age.  Her skin is warm and dry he does have numerous solar induced changes and seborrheic keratosis most mostly a large one in his mid back.  Eyes pupils appear reactive to light sclerae and conjunctivae are clear visual acuity appears grossly intact.  Oropharynx mucous membranes moist he has a slight brownish white film on his tongue.  Chest is clear to auscultation with somewhat shallow air entry I do not appreciate any labored breathing.  Heart is regular rate and rhythm with occasional irregular beats-he has minimal lower extremity edema reduced pedal pulses bilaterally.  Abdomen is soft nontender with positive bowel sounds.  Muscle skeletal surgical site Staples are in place left hip area there is no significant surrounding erythema or drainage these look quite benign.  He is able to move all his extremities at baseline has good grip strength bilaterally has arthritic changes but no acute deformities.  Neurologic is grossly intact his speech is clear.  Psych he appears grossly alert and oriented pleasant and appropriate.  Labs.  02/19/2014.  Sodium 133 potassium 4.7 BUN 21 creatinine 0.89.  WBC 14.2 hemoglobin 11.8 platelets 152.  July 2015-liver function tests within normal limits.  Assessment plan.  #1-history of left hip fracture status post repair-this is being followed by orthopedics he is on aspirin 325 mg a day for anticoagulation-at this  point appears stable he will need therapy.  #2-history of left pleural effusion-again monitor clinically family does not want aggressive intervention unless decompensates-O2 saturations continued to be in the 90s currently on oxygen--clinically he appears stable but fragile in this regards.  Number is 3 history of atrial fibrillation-he is on metoprolol this appears to be rate controlled he has been on aspirin for anticoagulation.  Hypertension-we have minimal reading so far Will continue to monitor-it appears his lisinopril hydrochlorothiazide was discontinued during his hospitalization again he is on metoprolol at this point lwill monitor--also will  update a BMP next laboratory day  #5 history diabetes type 2 apparently this was quite stable in the hospital in fact his CBGs and sliding scale were discontinued-hemoglobin A1c was 7.3-will order CBGs every morning on this for now.  #6-leukocytosis-this was thought to be likely stress related-apparently he was afebrile and stable for discharge and this appears to be the case as well this afternoon-at this point will update a CBC is recommended on Monday September September 28.  #7-film on tongue-question slight candidiasis-we'll treat with nystatin swish and swallow 4 times a day when necessary for 7 days.  [id number] note greater than 35 minutes spent assessing patient -- reviewing his chart-and coordinating and formulating a plan of care for numerous diagnoses-of note greater than 50% of time spent coordinating plan of care

## 2014-02-24 LAB — GLUCOSE, CAPILLARY
GLUCOSE-CAPILLARY: 173 mg/dL — AB (ref 70–99)
Glucose-Capillary: 111 mg/dL — ABNORMAL HIGH (ref 70–99)

## 2014-02-25 LAB — GLUCOSE, CAPILLARY: GLUCOSE-CAPILLARY: 113 mg/dL — AB (ref 70–99)

## 2014-02-26 ENCOUNTER — Non-Acute Institutional Stay (SKILLED_NURSING_FACILITY): Payer: Medicare PPO | Admitting: Internal Medicine

## 2014-02-26 DIAGNOSIS — S72002D Fracture of unspecified part of neck of left femur, subsequent encounter for closed fracture with routine healing: Secondary | ICD-10-CM

## 2014-02-26 DIAGNOSIS — I480 Paroxysmal atrial fibrillation: Secondary | ICD-10-CM

## 2014-02-26 DIAGNOSIS — S72009D Fracture of unspecified part of neck of unspecified femur, subsequent encounter for closed fracture with routine healing: Secondary | ICD-10-CM

## 2014-02-26 DIAGNOSIS — I4891 Unspecified atrial fibrillation: Secondary | ICD-10-CM

## 2014-02-26 DIAGNOSIS — J90 Pleural effusion, not elsewhere classified: Secondary | ICD-10-CM

## 2014-02-26 LAB — GLUCOSE, CAPILLARY: Glucose-Capillary: 125 mg/dL — ABNORMAL HIGH (ref 70–99)

## 2014-02-26 NOTE — Progress Notes (Signed)
Patient ID: Ronald Ball, male   DOB: 07/12/1913, 80 y.o.   MRN: [id number]  Facility; [facility] Chief complaint; admission to SNF post admit to [facility] from 9/18 to 9/23  History; this is a 80 year old man who was admitted to hospital after a fall suffering a left hip fracture. He underwent an ORIF. He was noted to have opacification of the left upper lobe with a large pleural effusion. Apparently this was not new in that the patient had previously refused a workup for this. In the hospital he underwent a thoracentesis for 1.3 L of fluid. The result of the analysis was exudative. The cause of the effusion was not immediately obvious. The patient had an echocardiogram that was really not that remarkable. There was normal EF. The patient was seen in or at least curb sided with pulmonary. As stated family did not want an aggressive workup of an exudative pleural effusion it was suggested that he did have a Pleurx catheter if he develops increasing shortness of breath her hypoxemia. He is here for further rehabilitation  Past Medical History  Diagnosis Date  . BPH (benign prostatic hyperplasia)   . Diverticulosis of colon   . History of barium enema 07/1991    Diverticuli  . History of CT scan of abdomen 08/14/05    abd/pelvis with and without R adrenal adenoma-stable  . Hyperlipidemia   . Glaucoma   . Hypertension   . Chronic back pain 2007    MRI scan of 03/15/06 revealed severe central canal stenosis at L4-5. He had extensive facet hypertrophy bilaterally. s/p ESI  . Constipation 11/2013    ER visit with miralax treatment  . Diabetes mellitus     02/16/14 - family denies  . Intertrochanteric fracture of left hip 01/2014  . Pleural effusion on left 2015    pt had declined further eval in past   Past Surgical History  Procedure Laterality Date  . Prostate biopsy  07/13/98    benign, cytoscopy wnl  . Shoulder surgery Right 04/03    [facility]  . Cataract extraction Left 04/01  .  Cataract extraction Right 01/04   . Intramedullary (im) nail intertrochanteric Left 01/2014    Lajoyce [facility]ers  . Intramedullary (im) nail intertrochanteric Left 02/17/2014    Procedure: INTRAMEDULLARY (IM) NAIL INTERTROCHANTRIC;  Surgeon: Nadara Mustard, MD;  Location: [facility];  Service: Orthopedics;  Laterality: Left;   Current Outpatient Prescriptions on File Prior to Visit  Medication Sig Dispense Refill  . acetaminophen (TYLENOL) 500 MG tablet Take 1 tablet (500 mg total) by mouth every 6 (six) hours as needed for mild pain.  30 tablet  0  . aspirin EC 325 MG EC tablet Take 1 tablet (325 mg total) by mouth daily with breakfast.  30 tablet  0  . Carboxymethylcellulose Sodium (REFRESH PLUS OP) Apply 1 drop to eye 3 (three) times daily as needed (dry eyes). Both eyes      . docusate sodium 100 MG CAPS Take 100 mg by mouth 2 (two) times daily.  60 capsule  0  . feeding supplement, ENSURE COMPLETE, (ENSURE COMPLETE) LIQD Take 237 mLs by mouth 2 (two) times daily between meals.  237 mL  90  . HYDROcodone-acetaminophen (NORCO/VICODIN) 5-325 MG per tablet Take one to two tablets by mouth every 6 hours as needed for moderate pain  240 tablet  0  . latanoprost (XALATAN) 0.005 % ophthalmic solution Place 1 drop into the right eye at bedtime.       Marland Kitchen  magnesium hydroxide (MILK OF MAGNESIA) 400 MG/5ML suspension Take 45 mLs by mouth daily as needed for mild constipation.      . metoprolol succinate (TOPROL-XL) 25 MG 24 hr tablet Take 1 tablet (25 mg total) by mouth daily.  30 tablet  1  . senna (SENOKOT) 8.6 MG TABS tablet Take 2 tablets (17.2 mg total) by mouth daily.  60 each  0  . timolol (TIMOPTIC) 0.5 % ophthalmic solution Place 1 drop into both eyes 2 (two) times daily.          Social: The patient lives in his own home with his wife of 75 years. Walks with a walker does not have a history of frequent falls.  reports that he has never smoked. He has never used smokeless tobacco. He reports that he does not drink  alcohol or use illicit drugs.  indicated that his mother is deceased. He indicated that his father is deceased. He indicated that only one of his three brothers is alive.   Review of system Respiratory he does not complain of chest pain or shortness of breath Cardiac no clear exertional chest pain Musculoskeletal he is already up on his feet in therapy  Physical examination Gen. patient is hard of hearing with a hearing aid in his right ear but that appears alert and cognizant and Respiratory decreased air entry over the left lower lobe with bronchial breathing however his work of breathing is normal Cardiac heart sounds are normal he appears to be euvolemic Abdomen the liver no spleen no tenderness. Extremities venous stasis, he appears to have mildly reduced strength in the right leg.  Impression/plan #1 status post left hip fracture now ORIF #2 exudative pleural effusion. Followup this reveals a negative culture and only benign reactive mesothelial cells on pathology. Nevertheless this was exudative and the patient and family declined further workup of this. It was suggested symptomatic management should he become short of breath etc. #3 paroxysmal atrial fibrillation. He did have atrial fibrillation during the hospitalization he was started on that Toprolol and aspirin. In 4 weeks the aspirin should be switched to ASA 81 daily. Echocardiogram showed normal EF of #4 leukocytosis of uncertain source. His white count at discharge was 14.2 a repeat this next week but there is no obvious cause of this at the bedside   ------------------------------------------------------------------- Transthoracic Echocardiography  (Report amended )  Patient:    Ronald Ball, Ronald Ball MR #:       [id number] Study Date: 02/18/2014 Gender:     M Age:        89 Height:     185.4 cm Weight:     82.3 kg BSA:        2.06 m^2 Pt. Status: Room:       1436   ADMITTING    Foye Spurling [id number]  Fay Records [id number]  Leana Roe [id number]  SONOGRAPHER  Danford Bad, RCS  PERFORMING   [facility], Inpatient  cc:  -------------------------------------------------------------------  ------------------------------------------------------------------- Indications:      786.05 Dyspnea.  ------------------------------------------------------------------- Study Conclusions  - Impressions: Limited exam.   There appears to be a large pleural effusion.   Cardiac images are limited / Poor   LV size is normal. In limted views (short axix, subcostal)   systolic function appears grossly normal. ALl walls were not   fully visualized.   Other structures were not seen well  enough to evaluate.  Impressions:  - Limited exam.   There appears to be a large pleural effusion.   Cardiac images are limited / Poor   LV size is normal. In limted views (short axix, subcostal)   systolic function appears grossly normal. ALl walls were not   fully visualized.Results for Ronald Ball, Ronald Ball (MRN [id number]) as of 02/26/2014 11:40  Ref. Range 02/17/2014 10:44 02/17/2014 11:07 02/17/2014 12:07 02/18/2014 05:27 02/19/2014 04:24  Sodium Latest Range: 137-147 mEq/L    132 (L) 133 (L)  Potassium Latest Range: 3.7-5.3 mEq/L    4.3 4.7  Chloride Latest Range: 96-112 mEq/L    93 (L) 96  CO2 Latest Range: 19-32 mEq/L    27 26  Mean Plasma Glucose Latest Range: <117 mg/dL 409 (H)      BUN Latest Range: 6-23 mg/dL    20 21  Creatinine Latest Range: 0.50-1.35 mg/dL    8.11 9.14  Calcium Latest Range: 8.4-10.5 mg/dL    8.7 8.6  GFR calc non Af Amer Latest Range: >90 mL/min    68 (L) 69 (L)  GFR calc Af Amer Latest Range: >90 mL/min    79 (L) 80 (L)  Glucose Latest Range: 70-99 mg/dL    782 (H) 99  Anion gap Latest Range: 5-15     12 11   Alkaline Phosphatase Latest Range: 39-117 U/L 92      Albumin Latest Range: 3.5-5.2 g/dL 3.3 (L)      AST Latest Range: 0-37 U/L 14      ALT Latest  Range: 0-53 U/L 9      Total Protein Latest Range: 6.0-8.3 g/dL 6.6      Bilirubin, Direct Latest Range: 0.0-0.3 mg/dL 0.2      Indirect Bilirubin Latest Range: 0.3-0.9 mg/dL 0.9      Total Bilirubin Latest Range: 0.3-1.2 mg/dL 1.1      LDH Latest Range: 94-250 U/L 159      WBC Latest Range: 4.0-10.5 K/uL    15.7 (H) 14.2 (H)  RBC Latest Range: 4.22-5.81 MIL/uL    4.33 4.08 (L)  Hemoglobin Latest Range: 13.0-17.0 g/dL    95.6 (L) 21.3 (L)  HCT Latest Range: 39.0-52.0 %    38.2 (L) 36.8 (L)  MCV Latest Range: 78.0-100.0 fL    88.2 90.2  MCH Latest Range: 26.0-34.0 pg    28.9 28.9  MCHC Latest Range: 30.0-36.0 g/dL    08.6 57.8  RDW Latest Range: 11.5-15.5 %    13.7 13.8  Platelets Latest Range: 150-400 K/uL    179 152  Hemoglobin A1C Latest Range: <5.7 % 7.3 (H)      Other Cells, Fluid No range found  TOO FEW TO COUNT, SMEAR AVAILABLE FOR REVIEW     Glucose, Fluid No range found  29     Fluid Type-FGLU No range found  PLEURAL     Fluid Type-FLDH No range found  PLEURAL     LD, Fluid Latest Range: 3-23 U/L  287 (H)     Total protein, fluid No range found  4.2     Fluid Type-FCT No range found  PLEURAL     Fluid Type-FTP No range found  PLEURAL     Color, Fluid Latest Range: YELLOW   YELLOW     WBC, Fluid Latest Range: 0-1000 cu mm  12     Appearance, Fluid Latest Range: CLEAR   CLOUDY (A)     BODY FLUID CULTURE No range found  Rpt  MRSA, PCR Latest Range: NEGATIVE    NEGATIVE    Staphylococcus aureus Latest Range: NEGATIVE    NEGATIVE       Other structures were not seen well enough to evaluate.  Transthoracic echocardiography.  M-mode, complete 2D, spectral Doppler, and color Doppler.  Birthdate:  Patient birthdate: [birth year].  Age:  Patient is 80 yr old.  Sex:  Gender: male. BMI: 23.9 kg/m^2.  Blood pressure:     109/66  Patient status: Inpatient.  Study date:  Study date: 02/18/2014. Study time:  08:28 AM.  -------------------------------------------------------------------  ------------------------------------------------------------------- [facility]

## 2014-02-27 LAB — GLUCOSE, CAPILLARY: Glucose-Capillary: 121 mg/dL — ABNORMAL HIGH (ref 70–99)

## 2014-02-28 LAB — GLUCOSE, CAPILLARY: Glucose-Capillary: 114 mg/dL — ABNORMAL HIGH (ref 70–99)

## 2014-03-01 LAB — GLUCOSE, CAPILLARY: Glucose-Capillary: 105 mg/dL — ABNORMAL HIGH (ref 70–99)

## 2014-03-03 LAB — GLUCOSE, CAPILLARY
GLUCOSE-CAPILLARY: 96 mg/dL (ref 70–99)
Glucose-Capillary: 105 mg/dL — ABNORMAL HIGH (ref 70–99)

## 2014-03-04 LAB — GLUCOSE, CAPILLARY: Glucose-Capillary: 104 mg/dL — ABNORMAL HIGH (ref 70–99)

## 2014-03-05 LAB — GLUCOSE, CAPILLARY: Glucose-Capillary: 100 mg/dL — ABNORMAL HIGH (ref 70–99)

## 2014-03-06 LAB — GLUCOSE, CAPILLARY: GLUCOSE-CAPILLARY: 105 mg/dL — AB (ref 70–99)

## 2014-03-07 LAB — GLUCOSE, CAPILLARY: GLUCOSE-CAPILLARY: 112 mg/dL — AB (ref 70–99)

## 2014-03-08 LAB — GLUCOSE, CAPILLARY: GLUCOSE-CAPILLARY: 88 mg/dL (ref 70–99)

## 2014-03-11 LAB — GLUCOSE, CAPILLARY
GLUCOSE-CAPILLARY: 109 mg/dL — AB (ref 70–99)
GLUCOSE-CAPILLARY: 87 mg/dL (ref 70–99)
Glucose-Capillary: 137 mg/dL — ABNORMAL HIGH (ref 70–99)
Glucose-Capillary: 109 mg/dL — ABNORMAL HIGH (ref 70–99)

## 2014-03-12 LAB — GLUCOSE, CAPILLARY: Glucose-Capillary: 127 mg/dL — ABNORMAL HIGH (ref 70–99)

## 2014-03-13 ENCOUNTER — Ambulatory Visit (HOSPITAL_COMMUNITY): Payer: Medicare PPO | Attending: Internal Medicine

## 2014-03-13 ENCOUNTER — Non-Acute Institutional Stay (SKILLED_NURSING_FACILITY): Payer: Medicare PPO | Admitting: Internal Medicine

## 2014-03-13 DIAGNOSIS — K648 Other hemorrhoids: Secondary | ICD-10-CM

## 2014-03-13 DIAGNOSIS — R05 Cough: Secondary | ICD-10-CM | POA: Insufficient documentation

## 2014-03-13 DIAGNOSIS — J948 Other specified pleural conditions: Secondary | ICD-10-CM

## 2014-03-13 DIAGNOSIS — D72829 Elevated white blood cell count, unspecified: Secondary | ICD-10-CM

## 2014-03-13 DIAGNOSIS — J90 Pleural effusion, not elsewhere classified: Secondary | ICD-10-CM

## 2014-03-13 NOTE — Progress Notes (Signed)
Patient ID: Ronald Ball, male   DOB: 03/11/1914, 80 y.o.   MRN: [id number]   this is an acute visit.  Level of care skilled.  [facility]    Chief complaint; acute  visit followup pleural effusion-    History; this is a 80 year old man who was admitted to hospital after a fall suffering a left hip fracture. He underwent an ORIF. He was noted to have opacification of the left upper lobe with a large pleural effusion. Apparently this was not new in that the patient had previously refused a workup for this. In the hospital he underwent a thoracentesis for 1.3 L of fluid. The result of the analysis was exudative. The cause of the effusion was not immediately obvious. The patient had an echocardiogram that was really not that remarkable. There was normal EF. The patient was seen in or at least curb sided with pulmonary. As stated family did not want an aggressive workup of an exudative pleural effusion it was suggested that he did have a Pleurx catheter if he develops increasing shortness of breath her hypoxemia. He is here for further rehabilitation A followup x-ray has been ordered which shows this left-sided pleural effusion is stable-his respiratory status appears to be at baseline as well-I did discuss the findings with his family including his son and wife at bedside.   the x-ray did show persistent white out of the left hemithorax favor reaccumulation of large left pleural effusion which is stable with the previous x-ray done last month  Past Medical History   Diagnosis  Date   .  BPH (benign prostatic hyperplasia)    .  Diverticulosis of colon    .  History of barium enema  07/1991     Diverticuli   .  History of CT scan of abdomen  08/14/05     abd/pelvis with and without R adrenal adenoma-stable   .  Hyperlipidemia    .  Glaucoma    .  Hypertension    .  Chronic back pain  2007     MRI scan of 03/15/06 revealed severe central canal stenosis at L4-5. He had extensive facet  hypertrophy bilaterally. s/p ESI   .  Constipation  11/2013     ER visit with miralax treatment   .  Diabetes mellitus      02/16/14 - family denies   .  Intertrochanteric fracture of left hip  01/2014   .  Pleural effusion on left  2015     pt had declined further eval in past    Past Surgical History   Procedure  Laterality  Date   .  Prostate biopsy   07/13/98     benign, cytoscopy wnl   .  Shoulder surgery  Right  04/03     [facility]   .  Cataract extraction  Left  04/01   .  Cataract extraction  Right  01/04   .  Intramedullary (im) nail intertrochanteric  Left  01/2014     Lajoyce [facility]ers[facility]   .  Intramedullary (im) nail intertrochanteric  Left  02/17/2014     Procedure: INTRAMEDULLARY (IM) NAIL INTERTROCHANTRIC; Surgeon: Nadara MustardMarcus Duda V, MD; Location: [facility]; Service: Orthopedics; Laterality: Left;                  .         .       .       .       .       .       .       .       Marland Kitchen.       .Marland Kitchen  Social: The patient lives in his own home with his wife of 75 years. Walks with a walker does not have a history of frequent falls.  reports that he has never smoked. He has never used smokeless tobacco. He reports that he does not drink alcohol or use illicit drugs.  indicated that his mother is deceased. He indicated that his father is deceased. He indicated that only one of his three brothers is alive .  Review of system Gen-- does not complaining of fever chills.    Respiratory he does not complain of che st pain or shortness of breath  Cardiac no clear exertional chest pain  GI he does not complaining of abdominal discomfort nausea or vomiting.  Rectal-does complain apparently of some rectal discomfort per nursing he has hemorrhoid  Physical examination  Temperature 98.8 pulse 71 respirations 18 blood pressure 125/67 Gen. patient is hard of hearing with a hearing aid in his right ear but that appears alert and cognizant and  Respiratory decreased air entry over the left lower  lobe with bronchial breathing however his work of breathing is normal  Cardiac heart sounds are normal he appears to be euvolemic  Abdomen--soft nontender with positive bowel sounds.    Labs.  02/28/2014.  Sodium 138 potassium 4.1 BUN 21 creatinine 0.92.  WBC 10.3 hemoglobin 12 platelets 277. .  Impression/plan    #1 exudative pleural effusion. X-ray shows stability of this  the patient and family have declined further workup of this. It was suggested symptomatic management should he become short of breath etc--at this point clinically he appears stable.  #2 paroxysmal atrial fibrillation. He did have atrial fibrillation during the hospitalization he was started on that Toprolol and aspirin. Aspirin has been reduced to 81 mg   #3 leukocytosis of uncertain source. His white count at discharge was 14.2 a repeat this shows this has decreased to 10.3-he is afebrile does not appear to be overtly symptomatic.  #4-rectal discomfort per nursing he does have a hemorrhoid and this is somewhat chronic will prescribe Anusol as needed  CPT [phone]

## 2014-03-14 LAB — GLUCOSE, CAPILLARY
GLUCOSE-CAPILLARY: 102 mg/dL — AB (ref 70–99)
GLUCOSE-CAPILLARY: 104 mg/dL — AB (ref 70–99)

## 2014-03-15 LAB — GLUCOSE, CAPILLARY: Glucose-Capillary: 108 mg/dL — ABNORMAL HIGH (ref 70–99)

## 2014-03-16 LAB — GLUCOSE, CAPILLARY: Glucose-Capillary: 117 mg/dL — ABNORMAL HIGH (ref 70–99)

## 2014-03-18 ENCOUNTER — Encounter: Payer: Self-pay | Admitting: Internal Medicine

## 2014-03-18 DIAGNOSIS — K649 Unspecified hemorrhoids: Secondary | ICD-10-CM

## 2014-03-18 HISTORY — DX: Unspecified hemorrhoids: K64.9

## 2014-03-18 LAB — GLUCOSE, CAPILLARY
GLUCOSE-CAPILLARY: 105 mg/dL — AB (ref 70–99)
Glucose-Capillary: 117 mg/dL — ABNORMAL HIGH (ref 70–99)

## 2014-03-19 LAB — GLUCOSE, CAPILLARY: GLUCOSE-CAPILLARY: 105 mg/dL — AB (ref 70–99)

## 2014-03-20 LAB — GLUCOSE, CAPILLARY: Glucose-Capillary: 103 mg/dL — ABNORMAL HIGH (ref 70–99)

## 2014-03-21 LAB — GLUCOSE, CAPILLARY: Glucose-Capillary: 121 mg/dL — ABNORMAL HIGH (ref 70–99)

## 2014-03-22 LAB — GLUCOSE, CAPILLARY: GLUCOSE-CAPILLARY: 97 mg/dL (ref 70–99)

## 2014-03-23 LAB — GLUCOSE, CAPILLARY: GLUCOSE-CAPILLARY: 110 mg/dL — AB (ref 70–99)

## 2014-03-24 LAB — GLUCOSE, CAPILLARY: Glucose-Capillary: 110 mg/dL — ABNORMAL HIGH (ref 70–99)

## 2014-03-25 LAB — GLUCOSE, CAPILLARY: Glucose-Capillary: 119 mg/dL — ABNORMAL HIGH (ref 70–99)

## 2014-03-26 LAB — GLUCOSE, CAPILLARY: Glucose-Capillary: 108 mg/dL — ABNORMAL HIGH (ref 70–99)

## 2014-03-27 LAB — GLUCOSE, CAPILLARY: Glucose-Capillary: 117 mg/dL — ABNORMAL HIGH (ref 70–99)

## 2014-03-28 DIAGNOSIS — Z9181 History of falling: Secondary | ICD-10-CM

## 2014-03-28 DIAGNOSIS — S72002D Fracture of unspecified part of neck of left femur, subsequent encounter for closed fracture with routine healing: Secondary | ICD-10-CM

## 2014-03-28 DIAGNOSIS — S32008D Other fracture of unspecified lumbar vertebra, subsequent encounter for fracture with routine healing: Secondary | ICD-10-CM

## 2014-04-02 ENCOUNTER — Ambulatory Visit (INDEPENDENT_AMBULATORY_CARE_PROVIDER_SITE_OTHER): Payer: Medicare PPO | Admitting: Family Medicine

## 2014-04-02 ENCOUNTER — Encounter: Payer: Self-pay | Admitting: Family Medicine

## 2014-04-02 VITALS — BP 110/64 | HR 76 | Temp 97.2°F | Wt 183.5 lb

## 2014-04-02 DIAGNOSIS — G8929 Other chronic pain: Secondary | ICD-10-CM

## 2014-04-02 DIAGNOSIS — J90 Pleural effusion, not elsewhere classified: Secondary | ICD-10-CM

## 2014-04-02 DIAGNOSIS — I10 Essential (primary) hypertension: Secondary | ICD-10-CM

## 2014-04-02 DIAGNOSIS — K5909 Other constipation: Secondary | ICD-10-CM

## 2014-04-02 DIAGNOSIS — K5900 Constipation, unspecified: Secondary | ICD-10-CM

## 2014-04-02 DIAGNOSIS — S72143A Displaced intertrochanteric fracture of unspecified femur, initial encounter for closed fracture: Secondary | ICD-10-CM

## 2014-04-02 DIAGNOSIS — M549 Dorsalgia, unspecified: Secondary | ICD-10-CM

## 2014-04-02 DIAGNOSIS — J948 Other specified pleural conditions: Secondary | ICD-10-CM

## 2014-04-02 NOTE — Assessment & Plan Note (Signed)
Came home early from rehab, has HH PT involved. Pain well controlled. Encouraged regular use of rolling walker whenever ambulating.

## 2014-04-02 NOTE — Assessment & Plan Note (Signed)
Chronic, stable. Continue toprol XL - new med since recent hospitalization.

## 2014-04-02 NOTE — Progress Notes (Signed)
BP 110/64 mmHg  Pulse 76  Temp(Src) 97.2 F (36.2 C) (Oral)  Wt 183 lb 8 oz (83.235 kg)   CC: hosp/rehab f/u  Subjective:    Patient ID: Ronald Ball Pflug, male    DOB: 11/15/1913, 80 y.o.   MRN: [id number]  HPI: Ronald Ball Isley is a 80 y.o. male presenting on 04/02/2014 for Follow-up   Recent hospitalization for intertrochanteric fracture of L hip treated with ORIF with intramedullary intertrochanteric nail. Sent to [facility]. Came home 03/27/2014. Has HH PT involved. Also has HEP. Has rolling walker.  Known chronic L pleural effusion - previously had declined workup but thoracentesis revealed likely exudative process negative for malignancy. Unclear cause. Re pleurex cath if worsening dyspnea.   Now with worsening back pain - takes tylenol 500mg  2-3 times daily. Has appt with NSG pending next Monday for f/u back pain - per pt with compression fractures, had discussed kyphoplasty  Constipation - stools when he takes MOM. miralax was too strong.  Wt Readings from Last 3 Encounters:  04/02/14 183 lb 8 oz (83.235 kg)  02/16/14 181 lb (82.101 kg)  01/11/14 186 lb 8 oz (84.596 kg)   Body mass index is 25.6 kg/(m^2).   Flu shot - declines. Pneumovax 1999, prevnar declines.  Td 2006.  Relevant past medical, surgical, family and social history reviewed and updated as indicated.  Allergies and medications reviewed and updated. Current Outpatient Prescriptions on File Prior to Visit  Medication Sig  . acetaminophen (TYLENOL) 500 MG tablet Take 1 tablet (500 mg total) by mouth every 6 (six) hours as needed for mild pain.  Marland Kitchen. aspirin 325 MG EC tablet Take 81 mg by mouth daily with breakfast.  . Carboxymethylcellulose Sodium (REFRESH PLUS OP) Apply 1 drop to eye 3 (three) times daily as needed (dry eyes). Both eyes  . latanoprost (XALATAN) 0.005 % ophthalmic solution Place 1 drop into the right eye at bedtime.   . magnesium hydroxide (MILK OF MAGNESIA) 400 MG/5ML  suspension Take 45 mLs by mouth daily as needed for mild constipation.  . metoprolol succinate (TOPROL-XL) 25 MG 24 hr tablet Take 1 tablet (25 mg total) by mouth daily.  . timolol (TIMOPTIC) 0.5 % ophthalmic solution Place 1 drop into both eyes 2 (two) times daily.     No current facility-administered medications on file prior to visit.   Past Medical History  Diagnosis Date  . BPH (benign prostatic hyperplasia)   . Diverticulosis of colon   . History of barium enema 07/1991    Diverticuli  . History of CT scan of abdomen 08/14/05    abd/pelvis with and without R adrenal adenoma-stable  . Hyperlipidemia   . Glaucoma   . Hypertension   . Chronic back pain 2007    MRI scan of 03/15/06 revealed severe central canal stenosis at L4-5. He had extensive facet hypertrophy bilaterally. s/p ESI  . Constipation 11/2013    ER visit with miralax treatment  . Diabetes mellitus     02/16/14 - family denies  . Intertrochanteric fracture of left hip 01/2014  . Pleural effusion on left 2015    pt had declined further eval in past  . PROSTATITIS, HX OF 12/30/2006    Qualifier: Diagnosis of  By: Hetty ElySchaller MD, Franne Gripobert Neal     Review of Systems Per HPI unless specifically indicated above    Objective:    BP 110/64 mmHg  Pulse 76  Temp(Src) 97.2 F (36.2 C) (Oral)  Wt  183 lb 8 oz (83.235 kg)  Physical Exam  Constitutional: He appears well-developed and well-nourished. No distress.  HENT:  Mouth/Throat: Oropharynx is clear and moist. No oropharyngeal exudate.  Eyes: Conjunctivae and EOM are normal. Pupils are equal, round, and reactive to light. No scleral icterus.  Cardiovascular: Normal rate, regular rhythm, normal heart sounds and intact distal pulses.   No murmur heard. Pulmonary/Chest: Effort normal and breath sounds normal. No respiratory distress. He has no wheezes. He has no rales.  Musculoskeletal: He exhibits edema (tr pedal edema).  Nursing note and vitals reviewed.      Assessment  & Plan:   Problem List Items Addressed This Visit    Pleural effusion, left    Chronic. S/p unrevealing thoracentesis. Pt desires to monitor for now. asxs.    Intertrochanteric fracture - Primary    Came home early from rehab, has HH PT involved. Pain well controlled. Encouraged regular use of rolling walker whenever ambulating.    Essential hypertension    Chronic, stable. Continue toprol XL - new med since recent hospitalization.    Chronic constipation    Discussed bowel regimen. Ok for MOM.    Chronic back pain    Pain recurring. Discussed tylenol use. Has appt pending with NSG.        Follow up plan: Return in about 6 months (around 10/01/2014), or as needed, for follow up visit.

## 2014-04-02 NOTE — Assessment & Plan Note (Signed)
Pain recurring. Discussed tylenol use. Has appt pending with NSG.

## 2014-04-02 NOTE — Progress Notes (Signed)
Pre visit review using our clinic review tool, if applicable. No additional management support is needed unless otherwise documented below in the visit note. 

## 2014-04-02 NOTE — Assessment & Plan Note (Signed)
Chronic. S/p unrevealing thoracentesis. Pt desires to monitor for now. asxs.

## 2014-04-02 NOTE — Assessment & Plan Note (Signed)
Discussed bowel regimen. Ok for MOM.

## 2014-04-02 NOTE — Patient Instructions (Signed)
Good to see you today, call us with questions. Return as needed or in 4-6 months for follow up visit. Ok to continue milk of magnesia as needed - and let me know if constipation is worsening.

## 2014-04-04 ENCOUNTER — Ambulatory Visit: Payer: Medicare PPO | Admitting: Family Medicine

## 2014-04-23 ENCOUNTER — Telehealth: Payer: Self-pay

## 2014-04-23 ENCOUNTER — Ambulatory Visit: Payer: Medicare PPO | Admitting: Internal Medicine

## 2014-04-23 NOTE — Telephone Encounter (Signed)
Patient Information:  [facility]  Phone: [phone]  Patient: Ronald Ball, Ronald Ball  Gender: Male  DOB: 02/06/1914  Age: 80 Years  PCP: Eustaquio BoydenGutierrez, Javier [facility](Family Practice)  Office Follow Up:  Does the office need to follow up with this patient?: No  Instructions For The Office: N/A  RN Note:  Report to [place] who discussed with Dr. Reece AgarG. Per Rena--Dr. Reece AgarG prefers that patient be evaluated today for this pain (appointment at 4pm today, 11/23, with Nicki Reaperegina Baity). Patient and spouse prefer to be seen tomorrow, 11/23, by Dr. Reece AgarG rather than another provider today. Rena scheduled appointment for patient tomorrow as requested and instructed the patient and his spouse that if his condition changes or worsens, then proceed to an UC facility for evaluation rather than waiting for evaluation tomorrow. Both verbalized their agreement.  Symptoms  Reason For Call & Symptoms: Pain under left arm near ribs like a bee sting causing tingling in left hand. Pain only occurs when the area is touched. Denies redness, open skin, or any observation of anything that could be causing the pain; further stating it appears "normal". Patient reports this has been going on for over a month and  he had it evaluated by Dr. Reece AgarG 3 weeks ago and was told  it wasnt too concerning at the time. Patient reports no changes since evalution, "about the same". No known injury or cause for this pain.  Reviewed Health History In EMR: Yes  Reviewed Medications In EMR: Yes  Reviewed Allergies In EMR: Yes  Reviewed Surgeries / Procedures: Yes  Date of Onset of Symptoms: Unknown  Guideline(s) Used:  Skin Injury  Disposition Per Guideline:   Go to ED Now (or to Office with PCP Approval)  Reason For Disposition Reached:   Wound causes numbness (i.e., loss of sensation)  Advice Given:  N/A  Patient Will Follow Care Advice:  YES

## 2014-04-23 NOTE — Telephone Encounter (Signed)
Megan with [facility] said pt was seen 04/02/14 with pain under lt arm near rib cage; more of a stinging sensation when touches area; no redness or rash and has tingling in lt hand. Symptoms have continued and due to numbness in hand [facility] has ED disposition; [facility]. Is it OK for pt to be seen this afternoon?

## 2014-04-23 NOTE — Telephone Encounter (Signed)
Pt wants to wait and be seen by Dr Reece AgarG on 04/24/14 after 11 AM; Dr Reece AgarG said prefers if evaluated today unless pt is insistent on seeing him on 04/24/14 and that would be OK; pt wants to wait to see Dr Reece AgarG on 04/24/14 at 11:30 AM.

## 2014-04-24 ENCOUNTER — Ambulatory Visit (INDEPENDENT_AMBULATORY_CARE_PROVIDER_SITE_OTHER): Payer: Medicare PPO | Admitting: Family Medicine

## 2014-04-24 ENCOUNTER — Encounter: Payer: Self-pay | Admitting: Family Medicine

## 2014-04-24 VITALS — BP 118/64 | HR 80 | Temp 97.6°F | Wt 184.5 lb

## 2014-04-24 DIAGNOSIS — S32019A Unspecified fracture of first lumbar vertebra, initial encounter for closed fracture: Secondary | ICD-10-CM

## 2014-04-24 DIAGNOSIS — R0789 Other chest pain: Secondary | ICD-10-CM | POA: Insufficient documentation

## 2014-04-24 DIAGNOSIS — K5900 Constipation, unspecified: Secondary | ICD-10-CM

## 2014-04-24 DIAGNOSIS — M48061 Spinal stenosis, lumbar region without neurogenic claudication: Secondary | ICD-10-CM

## 2014-04-24 DIAGNOSIS — M4806 Spinal stenosis, lumbar region: Secondary | ICD-10-CM

## 2014-04-24 DIAGNOSIS — K5909 Other constipation: Secondary | ICD-10-CM

## 2014-04-24 DIAGNOSIS — J90 Pleural effusion, not elsewhere classified: Secondary | ICD-10-CM

## 2014-04-24 DIAGNOSIS — J948 Other specified pleural conditions: Secondary | ICD-10-CM

## 2014-04-24 DIAGNOSIS — R202 Paresthesia of skin: Secondary | ICD-10-CM | POA: Insufficient documentation

## 2014-04-24 MED ORDER — DICLOFENAC SODIUM 1 % TD GEL
1.0000 | Freq: Three times a day (TID) | TRANSDERMAL | Status: DC
Start: 2014-04-24 — End: 2014-06-12

## 2014-04-24 NOTE — Progress Notes (Signed)
BP 118/64 mmHg  Pulse 80  Temp(Src) 97.6 F (36.4 C) (Oral)  Wt 184 lb 8 oz (83.689 kg)   CC: check chest pain and arm numbness  Subjective:    Patient ID: Ronald Ball, male    DOB: 09/27/1913, 80 y.o.   MRN: [id number]  HPI: Ronald RollsWilliam W Abdo is a 80 y.o. male presenting on 04/24/2014 for Numbness   L arm/entire hand numbness associated with occasional stinging pain "like bee sting" at one spot. ROM preserved. Strength intact. Worse stinging at night time. Ongong for last few months.  No fevers/chills, denies neck pain, chest pain. No new rashes.  Did injure L arm with fall but that had improved.  Sits in recliner most of the day  Persistent chronic back pain presumed from L1 compression fracture sustained around 12/2013 as well as known spinal stenosis.  MRI LUMBAR SPINE WITHOUT CONTRAST TECHNIQUE: Multiplanar, multisequence MR imaging of the lumbar spine was performed. No intravenous contrast was administered. COMPARISON: Plain radiographs 12/25/2013. FINDINGS: The alignment is anatomic. Conus is slightly low ending at L2-L3, with normal overall size. Prominent terminal ventricle. As suspected from plain films, there is an acute compression fracture at L1. The inferior endplate is elevated. There is loss of approximately 25% vertebral body height. An osteonecrotic cleft is seen along the inferior margin with its junction of the L1-2 disc space. A similar area of bone marrow edema is seen anteriorly superiorly at S1. There is no loss of vertebral body height, but this is a suspected second area of insufficiency fracture. Opposite the T12 vertebrae, incompletely evaluated, there is asymmetric facet and ligamentum flavum hypertrophy on the LEFT. Correlate clinically for LEFT-sided flank radicular symptoms. Also incidentally noted is a large LEFT pleural effusion, incompletely evaluated. The individual disc spaces were examined with axial images  as follows: L1-L2: Central protrusion. Mild facet and ligamentum flavum hypertrophy. No impingement. L2-L3: Central protrusion. Mild facet and ligamentum flavum hypertrophy. Mild central canal stenosis. Subarticular zone narrowing on the LEFT could affect the L3 nerve root. L3-L4: Central disc osteophyte complex narrows the LEFT greater than RIGHT subarticular zones. Mild central canal stenosis. LEFT L4 nerve root impingement is possible. L4-L5: Central disc extrusion with moderate facet and ligamentum flavum hypertrophy. Severe central canal stenosis. Both foramina are narrowed. RIGHT greater than LEFT L4 and L5 nerve root impingement are likely. L5-S1: Mild bulge. Mild facet ligamentum flavum hypertrophy. No impingement IMPRESSION: There is an acute compression fracture of L1 with 25% loss of vertebral body height. Inferior osteonecrotic cavity. No retropulsion. Similar area of insufficiency fracture suspected at S1 anteriorly without loss of vertebral body height. Multilevel spondylosis as described with the most severe changes at L4-5, where a central and rightward disc extrusion along with posterior element hypertrophy results in severe central canal stenosis and RIGHT greater than LEFT nerve root impingement. Electronically Signed  By: Davonna BellingJohn Curnes M.D.  On: 01/16/2014 08:37  Relevant past medical, surgical, family and social history reviewed and updated as indicated.  Allergies and medications reviewed and updated. Current Outpatient Prescriptions on File Prior to Visit  Medication Sig  . acetaminophen (TYLENOL) 500 MG tablet Take 1 tablet (500 mg total) by mouth every 6 (six) hours as needed for mild pain.  . Carboxymethylcellulose Sodium (REFRESH PLUS OP) Apply 1 drop to eye 3 (three) times daily as needed (dry eyes). Both eyes  . latanoprost (XALATAN) 0.005 % ophthalmic solution Place 1 drop into the right eye at bedtime.   . magnesium hydroxide (  MILK OF MAGNESIA)  400 MG/5ML suspension Take 45 mLs by mouth daily as needed for mild constipation.  . timolol (TIMOPTIC) 0.5 % ophthalmic solution Place 1 drop into both eyes 2 (two) times daily.    . metoprolol succinate (TOPROL-XL) 25 MG 24 hr tablet Take 1 tablet (25 mg total) by mouth daily. (Patient not taking: Reported on 04/24/2014)   No current facility-administered medications on file prior to visit.   Past Medical History  Diagnosis Date  . BPH (benign prostatic hyperplasia)   . Diverticulosis of colon   . History of barium enema 07/1991    Diverticuli  . History of CT scan of abdomen 08/14/05    abd/pelvis with and without R adrenal adenoma-stable  . Hyperlipidemia   . Glaucoma   . Hypertension   . Chronic back pain 2007    MRI scan of 03/15/06 revealed severe central canal stenosis at L4-5. He had extensive facet hypertrophy bilaterally. s/p ESI  . Constipation 11/2013    ER visit with miralax treatment  . Diabetes type 2, controlled 01/2014    goal A1c <8  . Intertrochanteric fracture of left hip 01/2014    s/p surgery  . Pleural effusion on left 2015    longstanding, pt had declined further eval in past, tapped during hospitalization - likely exudative  . PROSTATITIS, HX OF 12/30/2006    Qualifier: Diagnosis of  By: Hetty Ely MD, Franne Grip   . L1 vertebral fracture 01/11/2014    L1 by MRI - acute 25% loss, inferior osteonecrotic cavity, similar area suspected S1 (12/2013)   . Spinal stenosis at L4-L5 level 01/18/2014    By MRI - multilevel DDD with most severe changes at L4/5 with central and R disc extrusion results in severe central canal stenosis and R>L nerve root impingement (12/2013)   . DIVERTICULOSIS, COLON 07/27/2007  . Hemorrhoid 03/18/2014   Past Surgical History  Procedure Laterality Date  . Prostate biopsy  07/13/98    benign, cytoscopy wnl  . Shoulder surgery Right 04/03    [facility]  . Cataract extraction Left 04/01  . Cataract extraction Right 01/04   . Intramedullary  (im) nail intertrochanteric Left 01/2014    [facility]  . Intramedullary (im) nail intertrochanteric Left 02/17/2014    Procedure: INTRAMEDULLARY (IM) NAIL INTERTROCHANTRIC;  Surgeon: Nadara Mustard, MD;  Location: [facility];  Service: Orthopedics;  Laterality: Left;   Review of Systems Per HPI unless specifically indicated above    Objective:    BP 118/64 mmHg  Pulse 80  Temp(Src) 97.6 F (36.4 C) (Oral)  Wt 184 lb 8 oz (83.689 kg)  Physical Exam  Constitutional: He appears well-developed and well-nourished. No distress.  Sitting in wheelchair  HENT:  Head: Normocephalic and atraumatic.  Pulmonary/Chest: He exhibits tenderness.  Mild point tenderness/discomfort to palpation left lateral chest wall below axilla, but no specific hyperalgesia noted.  Musculoskeletal: He exhibits no edema.  FROM at shoulders without significant pain  Neurological: A sensory deficit is present.  5/5 BUE Exam not consistent with endorsed symptoms - diminished sensation mildly to light touch and temperature L 5th digit  Skin: Skin is warm and dry. Ecchymosis noted. No rash noted.  No evident vesicular rash Ecchymoses present bilateral forearms  Psychiatric: He has a normal mood and affect.  Nursing note and vitals reviewed.  Lab Results  Component Value Date   HGBA1C 7.3* 02/17/2014      Assessment & Plan:   Problem List Items Addressed This Visit  Spinal stenosis at L4-L5 level    Persistent back pain. Discussed ok to continue tylenol 500mg  prn.    Pleural effusion, left   Left-sided chest wall pain - Primary    Persistent pain over last month - anticipate focal neuralgia vs chest wall strain possibly from one of his recent falls. Encouraged continued ambulation with walker to decrease fall risk. Continue tylenol. Treat with voltaren gel as well. Consider low dose gabapentin for back and side pain if voltaren/tylenol ineffective.    Left hand paresthesia    Isolated to L 5th digit. Doesn't  follow any specific mononeuropathy/dermatome. Continue to monitor.    L1 vertebral fracture    Likely sustained 12/2013 after fall. Discussed referral to discuss kyphoplasty if persistent pain. Continue tylenol 500mg  4-5 tab daily. Tramadol stopped 2/2 constipation and after fall with resulting hip fracture (01/2014). Consider dexa to guide bisphosphonate use. Lab Results  Component Value Date   CREATININE 0.89 02/19/2014      Chronic constipation    MOM effective treatment.        Follow up plan: Return as needed.

## 2014-04-24 NOTE — Patient Instructions (Addendum)
We will try to get records from Dr Fredrich BirksStern's office - sign release form today. We may send you back to him to discuss kyphoplasty (cement into vertebrae). I think we have irritated nerve or muscle strain at left side of chest. Treat with topical anti inflammatory cream sent to pharmacy. If not better with this let me know.

## 2014-04-24 NOTE — Progress Notes (Signed)
Pre visit review using our clinic review tool, if applicable. No additional management support is needed unless otherwise documented below in the visit note. 

## 2014-04-25 ENCOUNTER — Encounter: Payer: Self-pay | Admitting: Family Medicine

## 2014-04-25 NOTE — Telephone Encounter (Signed)
Seen Tuesday.  plz call Friday for an update - to see if back pain controlled with tylenol 500mg  and pain in side controlled with voltaren gel prescribed on Tuesday. If pt/family desire, may refer to back doctor to discuss kyphoplasty. Also see if pt/family would like to try gabapentin for presumed L sided nerve pain - but may cause mild dizziness.

## 2014-04-25 NOTE — Assessment & Plan Note (Signed)
MOM effective treatment.

## 2014-04-25 NOTE — Assessment & Plan Note (Signed)
Isolated to L 5th digit. Doesn't follow any specific mononeuropathy/dermatome. Continue to monitor.

## 2014-04-25 NOTE — Assessment & Plan Note (Addendum)
Persistent pain over last month - anticipate focal neuralgia vs chest wall strain possibly from one of his recent falls. Encouraged continued ambulation with walker to decrease fall risk. Continue tylenol. Treat with voltaren gel as well. Consider low dose gabapentin for back and side pain if voltaren/tylenol ineffective.

## 2014-04-25 NOTE — Assessment & Plan Note (Addendum)
Likely sustained 12/2013 after fall. Discussed referral to discuss kyphoplasty if persistent pain. Continue tylenol 500mg  4-5 tab daily. Tramadol stopped 2/2 constipation and after fall with resulting hip fracture (01/2014). Consider dexa to guide bisphosphonate use. Lab Results  Component Value Date   CREATININE 0.89 02/19/2014

## 2014-04-25 NOTE — Assessment & Plan Note (Signed)
Persistent back pain. Discussed ok to continue tylenol 500mg  prn.

## 2014-04-27 NOTE — Telephone Encounter (Signed)
Spoke with patient's wife and she said pain seemed to be pretty well controlled with tylenol and voltaren gel, so she will have him hold off on the gabapentin for now. Patient already has an appt with Dr. Venetia MaxonStern on 05/05/14.

## 2014-05-01 DIAGNOSIS — M5030 Other cervical disc degeneration, unspecified cervical region: Secondary | ICD-10-CM

## 2014-05-01 HISTORY — DX: Other cervical disc degeneration, unspecified cervical region: M50.30

## 2014-05-04 ENCOUNTER — Encounter: Payer: Self-pay | Admitting: Family Medicine

## 2014-05-04 ENCOUNTER — Ambulatory Visit (INDEPENDENT_AMBULATORY_CARE_PROVIDER_SITE_OTHER): Payer: Medicare PPO | Admitting: Family Medicine

## 2014-05-04 VITALS — BP 124/76 | HR 80 | Temp 97.4°F | Wt 187.0 lb

## 2014-05-04 DIAGNOSIS — M549 Dorsalgia, unspecified: Secondary | ICD-10-CM

## 2014-05-04 DIAGNOSIS — R058 Other specified cough: Secondary | ICD-10-CM

## 2014-05-04 DIAGNOSIS — R0789 Other chest pain: Secondary | ICD-10-CM

## 2014-05-04 DIAGNOSIS — G8929 Other chronic pain: Secondary | ICD-10-CM

## 2014-05-04 DIAGNOSIS — R05 Cough: Secondary | ICD-10-CM

## 2014-05-04 DIAGNOSIS — J948 Other specified pleural conditions: Secondary | ICD-10-CM

## 2014-05-04 DIAGNOSIS — J90 Pleural effusion, not elsewhere classified: Secondary | ICD-10-CM

## 2014-05-04 DIAGNOSIS — R053 Chronic cough: Secondary | ICD-10-CM | POA: Insufficient documentation

## 2014-05-04 DIAGNOSIS — S32019D Unspecified fracture of first lumbar vertebra, subsequent encounter for fracture with routine healing: Secondary | ICD-10-CM

## 2014-05-04 DIAGNOSIS — R3915 Urgency of urination: Secondary | ICD-10-CM

## 2014-05-04 LAB — POCT URINALYSIS DIPSTICK
BILIRUBIN UA: NEGATIVE
Blood, UA: NEGATIVE
GLUCOSE UA: NEGATIVE
Ketones, UA: NEGATIVE
Leukocytes, UA: NEGATIVE
Nitrite, UA: NEGATIVE
Protein, UA: NEGATIVE
SPEC GRAV UA: 1.025
UROBILINOGEN UA: 0.2
pH, UA: 6

## 2014-05-04 NOTE — Progress Notes (Signed)
BP 124/76 mmHg  Pulse 80  Temp(Src) 97.4 F (36.3 C) (Oral)  Wt 187 lb (84.823 kg)  SpO2 98%   CC: f/u arm pain  Subjective:    Patient ID: Ronald Ball, male    DOB: 10/21/1913, 80 y.o.   MRN: [id number]  HPI: Ronald RollsWilliam W Nijjar is a 80 y.o. male presenting on 05/04/2014 for Follow-up and Cough   See prior notes for details.  L sided chest wall pain - thought focal neuralgia - continued tylenol and started voltaren gel last visit. voltaren didn't really help. Wife declined gabapentin trial. "bee sting" sensation less frequent but still prsent. Lasts seconds.   L 5th digit paresthesia - intermittently numb. Possibly after fall when he sustained hip fracture.   L1 vertebral fracture sustained 12/2013 after fall - pt has decided to pursue further treatment, requests referral back to Dr Venetia MaxonStern to discuss kyphoplasty/vertebroplasty.  Dry cough - present over last month, last night had bad cough fit. Cough doesn't worsen L chest wall pain. Treating with vicks cough syrup. Very rarely productive. No recent fevers/chills.  No dyspnea or chest pain.  Urinary urgency - noticing increased frequency/urgency esp when faucet on or when washing dishes. No dysuria, fevers. No new culprit medications noted.  Relevant past medical, surgical, family and social history reviewed and updated as indicated. Interim medical history since our last visit reviewed. Allergies and medications reviewed and updated.  Current Outpatient Prescriptions on File Prior to Visit  Medication Sig  . acetaminophen (TYLENOL) 500 MG tablet Take 1 tablet (500 mg total) by mouth every 6 (six) hours as needed for mild pain.  Marland Kitchen. aspirin EC 81 MG tablet Take 81 mg by mouth every other day.  . Carboxymethylcellulose Sodium (REFRESH PLUS OP) Apply 1 drop to eye 3 (three) times daily as needed (dry eyes). Both eyes  . diclofenac sodium (VOLTAREN) 1 % GEL Apply 1 application topically 3 (three) times daily.  Marland Kitchen. latanoprost  (XALATAN) 0.005 % ophthalmic solution Place 1 drop into the right eye at bedtime.   . magnesium hydroxide (MILK OF MAGNESIA) 400 MG/5ML suspension Take 45 mLs by mouth daily as needed for mild constipation.  . metoprolol succinate (TOPROL-XL) 25 MG 24 hr tablet Take 1 tablet (25 mg total) by mouth daily.  . timolol (TIMOPTIC) 0.5 % ophthalmic solution Place 1 drop into both eyes 2 (two) times daily.     No current facility-administered medications on file prior to visit.    Review of Systems Per HPI unless specifically indicated above     Objective:    BP 124/76 mmHg  Pulse 80  Temp(Src) 97.4 F (36.3 C) (Oral)  Wt 187 lb (84.823 kg)  SpO2 98%  Wt Readings from Last 3 Encounters:  05/04/14 187 lb (84.823 kg)  04/24/14 184 lb 8 oz (83.689 kg)  04/02/14 183 lb 8 oz (83.235 kg)    Physical Exam  Constitutional: He appears well-developed and well-nourished. No distress.  HENT:  Mouth/Throat: Oropharynx is clear and moist. No oropharyngeal exudate.  Cardiovascular: Normal rate, regular rhythm, normal heart sounds and intact distal pulses.   No murmur heard. Pulmonary/Chest: Effort normal. No respiratory distress. He has decreased breath sounds in the left lower field. He has no wheezes. He has no rales.  Chronic LLL pleural effusion  Musculoskeletal: He exhibits no edema.  Nursing note and vitals reviewed.      Assessment & Plan:   Problem List Items Addressed This Visit    Urinary urgency  Longstanding issue, may be worsening. Now off rapaflo 2/2 falls. Check UA today to r/o infection as cause - normal. Consider nonselective antimuscarinic.    Relevant Orders      POCT Urinalysis Dipstick (Completed)   Pleural effusion, left    Chronic cause. ?causing irritant cough.     Left-sided chest wall pain    Presumed focal neuralgia. discussed gabapentin, pt /family decide to avoid further medical treatment for now 2/2 concern with side effects.    L1 vertebral fracture -  Primary    Pt has decided to pursue procedural intervention for his chronic lumbar compression fracture, requests re referral back to Dr Venetia MaxonStern to discuss. Continue scheduled tylenol. Consider DEXA to guide decision on bisphosphonate therapy.    Relevant Orders      Ambulatory referral to Neurosurgery   Dry cough    Known chronic pleural effusion, otherwise benign exam. ?caused by pleural irritation from effusion. Monitor for now.    Chronic back pain    On scheduled tylenol analgesic regimen.  Severe constipation from narcotics.        Follow up plan: Return in about 3 months (around 08/03/2014), or as needed, for follow up visit.

## 2014-05-04 NOTE — Assessment & Plan Note (Signed)
Known chronic pleural effusion, otherwise benign exam. ?caused by pleural irritation from effusion. Monitor for now.

## 2014-05-04 NOTE — Assessment & Plan Note (Signed)
On scheduled tylenol analgesic regimen.  Severe constipation from narcotics.

## 2014-05-04 NOTE — Assessment & Plan Note (Signed)
Presumed focal neuralgia. discussed gabapentin, pt /family decide to avoid further medical treatment for now 2/2 concern with side effects.

## 2014-05-04 NOTE — Progress Notes (Signed)
Pre visit review using our clinic review tool, if applicable. No additional management support is needed unless otherwise documented below in the visit note. 

## 2014-05-04 NOTE — Assessment & Plan Note (Signed)
Chronic cause. ?causing irritant cough.

## 2014-05-04 NOTE — Assessment & Plan Note (Signed)
Longstanding issue, may be worsening. Now off rapaflo 2/2 falls. Check UA today to r/o infection as cause - normal. Consider nonselective antimuscarinic.

## 2014-05-04 NOTE — Assessment & Plan Note (Signed)
Pt has decided to pursue procedural intervention for his chronic lumbar compression fracture, requests re referral back to Dr Venetia MaxonStern to discuss. Continue scheduled tylenol. Consider DEXA to guide decision on bisphosphonate therapy.

## 2014-05-04 NOTE — Patient Instructions (Addendum)
If worsening back or bee sting pain, let me know and we will try gabapentin. We will hold on this for now. Urine check today to rule out infection. Lungs sound stable today. Ok to continue vicks cough syrup. We will try and get you in sooner with Dr Venetia MaxonStern - pass by [facility]'s office.

## 2014-05-14 ENCOUNTER — Telehealth: Payer: Self-pay | Admitting: Family Medicine

## 2014-05-14 NOTE — Telephone Encounter (Signed)
Patient was called from Dr Rush FarmerSterns office and doesn't want Kypoplasty at all he will FU in January with Dr Venetia MaxonStern.

## 2014-05-18 ENCOUNTER — Encounter: Payer: Self-pay | Admitting: Family Medicine

## 2014-05-18 ENCOUNTER — Ambulatory Visit (INDEPENDENT_AMBULATORY_CARE_PROVIDER_SITE_OTHER): Payer: Medicare PPO | Admitting: Family Medicine

## 2014-05-18 ENCOUNTER — Ambulatory Visit (INDEPENDENT_AMBULATORY_CARE_PROVIDER_SITE_OTHER)
Admission: RE | Admit: 2014-05-18 | Discharge: 2014-05-18 | Disposition: A | Discharge status: Home / Self Care | Payer: Medicare PPO | Source: Ambulatory Visit | Attending: Family Medicine | Admitting: Family Medicine

## 2014-05-18 ENCOUNTER — Ambulatory Visit
Admission: RE | Admit: 2014-05-18 | Discharge: 2014-05-18 | Disposition: A | Discharge status: Home / Self Care | Payer: Medicare PPO | Source: Ambulatory Visit | Attending: Family Medicine | Admitting: Family Medicine

## 2014-05-18 VITALS — BP 130/60 | HR 73 | Temp 97.7°F | Wt 188.0 lb

## 2014-05-18 DIAGNOSIS — M25512 Pain in left shoulder: Secondary | ICD-10-CM

## 2014-05-18 DIAGNOSIS — R202 Paresthesia of skin: Secondary | ICD-10-CM

## 2014-05-18 DIAGNOSIS — S32019D Unspecified fracture of first lumbar vertebra, subsequent encounter for fracture with routine healing: Secondary | ICD-10-CM

## 2014-05-18 DIAGNOSIS — R0789 Other chest pain: Secondary | ICD-10-CM

## 2014-05-18 NOTE — Progress Notes (Signed)
BP 130/60 mmHg  Pulse 73  Temp(Src) 97.7 F (36.5 C) (Oral)  Wt 188 lb (85.276 kg)  SpO2 95%   CC: L shoulder pain  Subjective:    Patient ID: Ronald Ball, male    DOB: 03/19/1914, 80 y.o.   MRN: [id number]  HPI: Ronald RollsWilliam W Nazareno is a 80 y.o. male presenting on 05/18/2014 for left shoulder pain   L sided chest wall pain - thought focal neuralgia - continued tylenol and started voltaren gel last visit. voltaren didn't really help. Wife declined gabapentin trial. "bee sting" sensation less frequent but still present. Lasts seconds. Slowly improving.   L1 vertebral fracture sustained 12/2013 after fall - pt has decided to pursue further treatment, requests referral back to Dr Venetia MaxonStern to discuss kyphoplasty/vertebroplasty --> see last phone note - family states they were contacted by neurosurgery office and advised Dr Venetia MaxonStern would not recommend kyphoplasty but they did not talk with MD about this.   Today endorses persistent L shoulder pain worse with laying on left side. Started after fall earlier in the year 01/2014 when he fractured hip. Associated with pain /soreness and numbness down left arm. No right arm pain, no chest pain or tightness.   Takes tylenol and voltaren gel prn pain which helps some.  Relevant past medical, surgical, family and social history reviewed and updated as indicated. Interim medical history since our last visit reviewed. Allergies and medications reviewed and updated. Current Outpatient Prescriptions on File Prior to Visit  Medication Sig  . acetaminophen (TYLENOL) 500 MG tablet Take 1 tablet (500 mg total) by mouth every 6 (six) hours as needed for mild pain. (Patient taking differently: Take 500 mg by mouth 3 (three) times daily. )  . aspirin EC 81 MG tablet Take 81 mg by mouth every other day.  . Carboxymethylcellulose Sodium (REFRESH PLUS OP) Apply 1 drop to eye 3 (three) times daily as needed (dry eyes). Both eyes  . diclofenac sodium (VOLTAREN) 1 %  GEL Apply 1 application topically 3 (three) times daily.  Marland Kitchen. latanoprost (XALATAN) 0.005 % ophthalmic solution Place 1 drop into the right eye at bedtime.   . magnesium hydroxide (MILK OF MAGNESIA) 400 MG/5ML suspension Take 45 mLs by mouth daily as needed for mild constipation.  . metoprolol succinate (TOPROL-XL) 25 MG 24 hr tablet Take 1 tablet (25 mg total) by mouth daily.  . timolol (TIMOPTIC) 0.5 % ophthalmic solution Place 1 drop into both eyes 2 (two) times daily.     No current facility-administered medications on file prior to visit.    Review of Systems Per HPI unless specifically indicated above     Objective:    BP 130/60 mmHg  Pulse 73  Temp(Src) 97.7 F (36.5 C) (Oral)  Wt 188 lb (85.276 kg)  SpO2 95%  Wt Readings from Last 3 Encounters:  05/18/14 188 lb (85.276 kg)  05/04/14 187 lb (84.823 kg)  04/24/14 184 lb 8 oz (83.689 kg)    Physical Exam  Constitutional: He appears well-developed and well-nourished. No distress.  HENT:  Mouth/Throat: Oropharynx is clear and moist. No oropharyngeal exudate.  Musculoskeletal: He exhibits no edema.  R shoulder WNL L Shoulder exam: No deformity of shoulders on inspection. No significant pain with palpation of shoulder landmarks. FROM in abduction and forward flexion. No pain or weakness with testing SITS in ext/int rotation. + pain with empty can sign. Neg Speed test. Mild impingement. No pain with crossover test. No pain with rotation of humeral  head in [facility].  Skin: Skin is warm and dry. No rash noted.  Nursing note and vitals reviewed.      Assessment & Plan:   Problem List Items Addressed This Visit    Left-sided chest wall pain    Presumed focal neuralgia - actually seems to be improving. Have declined gabapentin.    Left shoulder pain - Primary    Check L shoulder and cervical neck xrays today given longevity of sxs and associated neurological sxs. Anticipate RTC injury - continue tylenol, voltaren. Add  on low dose ibuprofen prn pain for next 1-2 wks. Update if worsening sxs or not improving as expected.    Relevant Orders      DG Shoulder Left      DG Cervical Spine Complete   Left hand paresthesia    Check cervical spine xrays today.    Relevant Orders      DG Cervical Spine Complete   L1 vertebral fracture    Pt will discuss possible kyphoplasty with neurosurgery at upcoming appt next month. Has declined DEXA scan.        Follow up plan: Return in about 3 months (around 08/17/2014), or if symptoms worsen or fail to improve, for follow up visit.

## 2014-05-18 NOTE — Assessment & Plan Note (Signed)
Check cervical spine xrays today.

## 2014-05-18 NOTE — Telephone Encounter (Signed)
Seen today. 

## 2014-05-18 NOTE — Assessment & Plan Note (Signed)
Presumed focal neuralgia - actually seems to be improving. Have declined gabapentin.

## 2014-05-18 NOTE — Patient Instructions (Addendum)
Xray of left shoulder and of neck today. I think shoulder pain is due to rotator cuff inflammation. Continue tylenol 500mg  three times daily, may also start ibuprofen 400mg  (2 tablets) twice daily as needed for pain for next 1 week to see if improvement, take with food. Continue voltaren gel. If no improvement we could set up physical therapy for left shoulder. Keep me updated with L shoulder pain after starting anti inflammatory ibuprofen regimen.

## 2014-05-18 NOTE — Assessment & Plan Note (Signed)
Check L shoulder and cervical neck xrays today given longevity of sxs and associated neurological sxs. Anticipate RTC injury - continue tylenol, voltaren. Add on low dose ibuprofen prn pain for next 1-2 wks. Update if worsening sxs or not improving as expected.

## 2014-05-18 NOTE — Progress Notes (Signed)
Pre visit review using our clinic review tool, if applicable. No additional management support is needed unless otherwise documented below in the visit note. 

## 2014-05-18 NOTE — Assessment & Plan Note (Signed)
Pt will discuss possible kyphoplasty with neurosurgery at upcoming appt next month. Has declined DEXA scan.

## 2014-05-19 ENCOUNTER — Other Ambulatory Visit: Payer: Self-pay | Admitting: Family Medicine

## 2014-05-19 ENCOUNTER — Encounter: Payer: Self-pay | Admitting: Family Medicine

## 2014-05-19 DIAGNOSIS — S32019D Unspecified fracture of first lumbar vertebra, subsequent encounter for fracture with routine healing: Secondary | ICD-10-CM

## 2014-05-21 ENCOUNTER — Telehealth: Payer: Self-pay

## 2014-05-21 NOTE — Telephone Encounter (Signed)
PLEASE NOTE: All timestamps contained within this report are represented as [place] Standard Time. CONFIDENTIALTY NOTICE: This fax transmission is intended only for the addressee. It contains information that is legally privileged, confidential or otherwise protected from use or disclosure. If you are not the intended recipient, you are strictly prohibited from reviewing, disclosing, copying using or disseminating any of this information or taking any action in reliance on or regarding this information. If you have received this fax in error, please notify us immediately by telephone so that we can arrange for its return to us. Phone: [phone], Toll-Free: [phone], Fax: [phone] Page: 1 of 1 Call Id: [id number] [facility] - Client TELEPHONE ADVICE RECORD [facility] Patient Name: Ronald Ball Gender: Male DOB: 08/27/1913 Age: 80 Y 2 M 16 D Return Phone Number: [phone] (Primary) Address: [place]State[place]Zip: [place] [id number] Client [facility] - Client Client Site [facility] - Night Physician Eustaquio BoydenGutierrez, Javier Contact Type Call Call Type Triage / Clinical Caller Name Casimiro NeedleMichael Relationship To Patient Son Return Phone Number [phone] (Primary) Chief Complaint Medication Question (non symptomatic) Initial Comment Caller states his father was given instructions about medicine, but he does not understand what to do. Nurse Assessment Nurse: Annye Englisharmon, RN, Angelique Blonderenise Date/Time (Eastern Time): 05/19/2014 9:24:56 AM Confirm and document reason for call. If symptomatic, describe symptoms. ---Caller states his father was given instructions about medicine, but he does not understand what to do. States the MD told the pt to take Ibuprophen 400mg  po bid. States the tabs are 200mg  tabs. Has the patient traveled out of the country within the last 30 days? ---Not Applicable Does the  patient require triage? ---No Please document clinical information provided and list any resource used. ---Based on RN clinical knowledge, Advised to take Ibuprophen 200mg  2 tabs po bid to = 400mg  po bid. Verb understanding. Guidelines Guideline Title Affirmed Question Affirmed Notes Nurse Date/Time (Eastern Time) Disp. Time Lamount Cohen(Eastern Time) Disposition Final User 05/19/2014 9:27:42 AM Clinical Call Yes Annye Englisharmon, RN, Angelique Blonderenise After Care Instructions Given Call Event Type User Date / Time Description

## 2014-05-21 NOTE — Telephone Encounter (Signed)
Noted. Thanks.

## 2014-05-22 ENCOUNTER — Other Ambulatory Visit (INDEPENDENT_AMBULATORY_CARE_PROVIDER_SITE_OTHER): Payer: Medicare PPO

## 2014-05-22 DIAGNOSIS — S32019D Unspecified fracture of first lumbar vertebra, subsequent encounter for fracture with routine healing: Secondary | ICD-10-CM

## 2014-05-22 LAB — BASIC METABOLIC PANEL
BUN: 23 mg/dL (ref 6–23)
CO2: 32 mEq/L (ref 19–32)
Calcium: 9.2 mg/dL (ref 8.4–10.5)
Chloride: 97 mEq/L (ref 96–112)
Creatinine, Ser: 1 mg/dL (ref 0.4–1.5)
GFR: 71.64 mL/min (ref >60.00)
GLUCOSE: 129 mg/dL — AB (ref 70–99)
POTASSIUM: 4.1 meq/L (ref 3.5–5.1)
SODIUM: 136 meq/L (ref 135–145)

## 2014-05-23 LAB — VITAMIN D 25 HYDROXY (VIT D DEFICIENCY, FRACTURES): VITD: 21.76 ng/mL — ABNORMAL LOW (ref 30.00–100.00)

## 2014-06-01 DIAGNOSIS — G950 Syringomyelia and syringobulbia: Secondary | ICD-10-CM

## 2014-06-01 HISTORY — DX: Syringomyelia and syringobulbia: G95.0

## 2014-06-04 ENCOUNTER — Encounter: Payer: Self-pay | Admitting: Internal Medicine

## 2014-06-04 ENCOUNTER — Ambulatory Visit (INDEPENDENT_AMBULATORY_CARE_PROVIDER_SITE_OTHER): Payer: Medicare PPO | Admitting: Internal Medicine

## 2014-06-04 VITALS — BP 118/68 | HR 76 | Temp 97.9°F | Resp 18

## 2014-06-04 DIAGNOSIS — M7582 Other shoulder lesions, left shoulder: Secondary | ICD-10-CM

## 2014-06-04 DIAGNOSIS — M25512 Pain in left shoulder: Secondary | ICD-10-CM

## 2014-06-04 DIAGNOSIS — M5412 Radiculopathy, cervical region: Secondary | ICD-10-CM

## 2014-06-04 DIAGNOSIS — R200 Anesthesia of skin: Secondary | ICD-10-CM

## 2014-06-04 DIAGNOSIS — R208 Other disturbances of skin sensation: Secondary | ICD-10-CM

## 2014-06-04 MED ORDER — GABAPENTIN 100 MG PO CAPS
100.0000 mg | ORAL_CAPSULE | Freq: Three times a day (TID) | ORAL | Status: DC | PRN
Start: 2014-06-04 — End: 2014-06-12

## 2014-06-04 NOTE — Progress Notes (Signed)
Subjective:    Patient ID: Ronald Ball, male    DOB: Jan 17, 1914, 81 y.o.   MRN: [id number]  HPI  Pt presents to the clinic today with c/o left shoulder pain. He reports this started 3 month ago after a fall. He has had some associated numbness in his left arm as well. He was seen 05/18/14 for the same. Xray of the left shoulder showed:  IMPRESSION: Limited study. Evidence of calcific tendinosis. No fracture or dislocation present on this admittedly significantly limited study. Probable large left pleural effusion.  Xray of his cervical spine showed:  IMPRESSION: 1. Cannot completely exclude a chronic partially healed fracture at C5-C6. Recommend CT of the cervical spine. 2. Multilevel disc osteophytic disease. 3. Bilateral neural foraminal narrowing from C4 through C6.  He was advised to take Advil for a left shoulder tendonitis. He reports the advil caused him to feel nauseated. He was also offered gabapentin to take for the numbness in the left arm, but he was not interested in starting the gabapentin at that time. He reports the left shoulder pain comes and goes, but the numbness is what is really bothering him. He also reports that it feels like "bee stings" in the left arm. He wants to know what else he can do for the pain and numbness.  Review of Systems      Past Medical History  Diagnosis Date  . BPH (benign prostatic hyperplasia)   . Diverticulosis of colon   . History of barium enema 07/1991    Diverticuli  . History of CT scan of abdomen 08/14/05    abd/pelvis with and without R adrenal adenoma-stable  . Hyperlipidemia   . Glaucoma   . Hypertension   . Chronic back pain 2007    MRI scan of 03/15/06 revealed severe central canal stenosis at L4-5. He had extensive facet hypertrophy bilaterally. s/p ESI  . Constipation 11/2013    ER visit with miralax treatment  . Diabetes type 2, controlled 01/2014    goal A1c <8  . Intertrochanteric fracture of left hip  01/2014    s/p surgery  . Pleural effusion on left 2015    longstanding, pt had declined further eval in past, tapped during hospitalization - likely exudative  . PROSTATITIS, HX OF 12/30/2006    Qualifier: Diagnosis of  By: Hetty Ely MD, Franne Grip   . L1 vertebral fracture 01/11/2014    L1 by MRI - acute 25% loss, inferior osteonecrotic cavity, similar area suspected S1 (12/2013)   . Spinal stenosis at L4-L5 level 01/18/2014    By MRI - multilevel DDD with most severe changes at L4/5 with central and R disc extrusion results in severe central canal stenosis and R>L nerve root impingement (12/2013)   . DIVERTICULOSIS, COLON 07/27/2007  . Hemorrhoid 03/18/2014  . DDD (degenerative disc disease), cervical 05/2014    by xray    Current Outpatient Prescriptions  Medication Sig Dispense Refill  . acetaminophen (TYLENOL) 500 MG tablet Take 1 tablet (500 mg total) by mouth every 6 (six) hours as needed for mild pain. (Patient taking differently: Take 500 mg by mouth 3 (three) times daily. ) 30 tablet 0  . aspirin EC 81 MG tablet Take 81 mg by mouth every other day.    . Carboxymethylcellulose Sodium (REFRESH PLUS OP) Apply 1 drop to eye 3 (three) times daily as needed (dry eyes). Both eyes    . diclofenac sodium (VOLTAREN) 1 % GEL Apply 1 application topically 3 (  three) times daily. 1 Tube 1  . ibuprofen (ADVIL,MOTRIN) 200 MG tablet Take 200 mg by mouth every 6 (six) hours as needed.    . latanoprost (XALATAN) 0.005 % ophthalmic solution Place 1 drop into the right eye at bedtime.     . magnesium hydroxide (MILK OF MAGNESIA) 400 MG/5ML suspension Take 45 mLs by mouth daily as needed for mild constipation.    . metoprolol succinate (TOPROL-XL) 25 MG 24 hr tablet Take 1 tablet (25 mg total) by mouth daily. 30 tablet 1  . timolol (TIMOPTIC) 0.5 % ophthalmic solution Place 1 drop into both eyes 2 (two) times daily.       No current facility-administered medications for this visit.    Allergies    Allergen Reactions  . Atenolol     REACTION: ? Side effect  . Esomeprazole Magnesium     REACTION: abd swelling  . Ofloxacin     REACTION: itching    Family History  Problem Relation Age of Onset  . Cancer Mother     stomach  and colon  . Cancer Brother     possible lung cancer    History   Social History  . Marital Status: Married    Spouse Name: N/A    Number of Children: 3  . Years of Education: N/A   Occupational History  . 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 Smoking status: Never Smoker   . Smokeless tobacco: Never Used  . Alcohol Use: No  . Drug Use: No  . Sexual Activity: Not on file   Other Topics Concern  . Not on file   Social History Narrative   3 sons, local   Married 1940   Retired Curator     Constitutional: Denies fever, malaise, fatigue, headache or abrupt weight changes.  Musculoskeletal: Pt reports left arm pain. Denies difficulty with gait, muscle pain or joint swelling.  Skin: Denies redness, rashes, lesions or ulcercations.  Neurological: Pt reports left arm numbness. Denies dizziness, difficulty with memory, difficulty with speech.   No other specific complaints in a complete review of systems (except as listed in HPI above).  Objective:   Physical Exam   There were no vitals taken for this visit. Wt Readings from Last 3 Encounters:  05/18/14 188 lb (85.276 kg)  05/04/14 187 lb (84.823 kg)  04/24/14 184 lb 8 oz (83.689 kg)    General: Appears his stated age in NAD. Cardiovascular: Normal rate with irregular rhythm.  No murmur, rubs or gallops noted.  Pulmonary/Chest: Normal effort and positive vesicular breath sounds. No respiratory distress. No wheezes, rales or ronchi noted.  Musculoskeletal: Normal internal and external rotation of the shoulders. No pain with palpation of the clavicle or AC joint. Some pain with palpation of the biceps tendon. Strength 4/5 LUE, 5/5 RUE. Normal flexion of the  cervical spine. Decreased extension and lateral rotation. No pain with palpation of the cervical spine.  Neurological: Sensation intact to bilateral hands and arms. + brachial and radial reflex noted.  BMET    Component Value Date/Time   NA 136 05/22/2014 1400   K 4.1 05/22/2014 1400   CL 97 05/22/2014 1400   CO2 32 05/22/2014 1400   GLUCOSE 129* 05/22/2014 1400   BUN 23 05/22/2014 1400   CREATININE 1.0 05/22/2014 1400   CALCIUM 9.2 05/22/2014 1400   GFRNONAA 69* 02/19/2014 0424   GFRAA 80* 02/19/2014 0424    Lipid Panel  Component Value Date/Time   CHOL 182 07/02/2010 0912   TRIG 128.0 07/02/2010 0912   HDL 41.20 07/02/2010 0912   CHOLHDL 4 07/02/2010 0912   VLDL 25.6 07/02/2010 0912   LDLCALC 115* 07/02/2010 0912    CBC    Component Value Date/Time   WBC 14.2* 02/19/2014 0424   RBC 4.08* 02/19/2014 0424   HGB 11.8* 02/19/2014 0424   HCT 36.8* 02/19/2014 0424   PLT 152 02/19/2014 0424   MCV 90.2 02/19/2014 0424   MCH 28.9 02/19/2014 0424   MCHC 32.1 02/19/2014 0424   RDW 13.8 02/19/2014 0424   LYMPHSABS 1.4 02/16/2014 1804   MONOABS 1.3* 02/16/2014 1804   EOSABS 0.1 02/16/2014 1804   BASOSABS 0.0 02/16/2014 1804    Hgb A1C Lab Results  Component Value Date   HGBA1C 7.3* 02/17/2014        Assessment & Plan:   Left shoulder pain secondary to rotator cuff tendonitis:  Advil made him nauseated He will try tylenol for now  Left arm numbness secondary to cervical radiculitis:  He is okay with starting Gabapentin 100 mg daily prn. Will titrate over the next week- schedule written out and given  If no improvement in 1 week or if the pain or numbness worsens, follow up with PCP

## 2014-06-04 NOTE — Patient Instructions (Signed)

## 2014-06-12 ENCOUNTER — Ambulatory Visit (INDEPENDENT_AMBULATORY_CARE_PROVIDER_SITE_OTHER): Payer: Medicare PPO | Admitting: Family Medicine

## 2014-06-12 ENCOUNTER — Encounter: Payer: Self-pay | Admitting: Family Medicine

## 2014-06-12 VITALS — BP 124/60 | HR 78 | Temp 97.5°F | Wt 201.5 lb

## 2014-06-12 DIAGNOSIS — E559 Vitamin D deficiency, unspecified: Secondary | ICD-10-CM

## 2014-06-12 DIAGNOSIS — J948 Other specified pleural conditions: Secondary | ICD-10-CM

## 2014-06-12 DIAGNOSIS — M5412 Radiculopathy, cervical region: Secondary | ICD-10-CM | POA: Insufficient documentation

## 2014-06-12 DIAGNOSIS — R200 Anesthesia of skin: Secondary | ICD-10-CM

## 2014-06-12 DIAGNOSIS — R600 Localized edema: Secondary | ICD-10-CM

## 2014-06-12 DIAGNOSIS — R05 Cough: Secondary | ICD-10-CM

## 2014-06-12 DIAGNOSIS — J90 Pleural effusion, not elsewhere classified: Secondary | ICD-10-CM

## 2014-06-12 DIAGNOSIS — R058 Other specified cough: Secondary | ICD-10-CM

## 2014-06-12 DIAGNOSIS — M25512 Pain in left shoulder: Secondary | ICD-10-CM

## 2014-06-12 DIAGNOSIS — R208 Other disturbances of skin sensation: Secondary | ICD-10-CM

## 2014-06-12 MED ORDER — BENZONATATE 100 MG PO CAPS: 100.0000 mg | ORAL_CAPSULE | Freq: Three times a day (TID) | ORAL | Status: AC | PRN

## 2014-06-12 MED ORDER — FUROSEMIDE 20 MG PO TABS: 10.0000 mg | ORAL_TABLET | Freq: Every day | ORAL | Status: DC | PRN

## 2014-06-12 NOTE — Assessment & Plan Note (Signed)
See above

## 2014-06-12 NOTE — Assessment & Plan Note (Signed)
Chronic. Anticipate causing chronic irritant cough, discussed this. Will trial tessalon perls.

## 2014-06-12 NOTE — Assessment & Plan Note (Signed)
Chronic calcific tendinosis on xray. Continue tylenol, ibuprofen. voltaren ileffective.

## 2014-06-12 NOTE — Progress Notes (Signed)
BP 124/60 mmHg  Pulse 78  Temp(Src) 97.5 F (36.4 C) (Oral)  Wt 201 lb 8 oz (91.4 kg)  SpO2 97%   CC: dyspnea with cough, edema L hand and feet Subjective:    Patient ID: Ronald Ball, male    DOB: November 28, 1913, 81 y.o.   MRN: [id number]  HPI: Ronald Ball is a 81 y.o. male presenting on 06/12/2014 for Edema and Shortness of Breath   Known L shoulder pain from chronic calcific tendinosis and L arm numbness presumed 2/2 cervical radiculitis. Recently saw Rene Kocher, started on neurontin . voltaren gel ineffective.  Endorsing persistent ache from L shoulder down to hand along with L hand numbness. Better with laying down. Taking tylenol  tid, ibuprofen  nightly, and gabapentin  tid.   13lb weight gain noted in last few weeks.  Increased frequency and urgency without dysuria. No urinary accidents.   Cough - persistent whenever he talks. Not productive, no fevers/chills. No dyspnea except when coughing. Known chronic L pleural effusion. Also noticing leg swelling over last few days.  Relevant past medical, surgical, family and social history reviewed and updated as indicated. Interim medical history since our last visit reviewed. Allergies and medications reviewed and updated. Current Outpatient Prescriptions on File Prior to Visit  Medication Sig  . acetaminophen (TYLENOL) 500 MG tablet Take 1 tablet (500 mg total) by mouth every 6 (six) hours as needed for mild pain. (Patient taking differently: Take 500 mg by mouth 3 (three) times daily. )  . aspirin EC 81 MG tablet Take 81 mg by mouth every other day.  . Carboxymethylcellulose Sodium (REFRESH PLUS OP) Apply 1 drop to eye 3 (three) times daily as needed (dry eyes). Both eyes  . ibuprofen (ADVIL,MOTRIN) 200 MG tablet Take 200 mg by mouth at bedtime.   Marland Kitchen latanoprost (XALATAN) 0.005 % ophthalmic solution Place 1 drop into the right eye at bedtime.   . magnesium hydroxide (MILK OF MAGNESIA) 400 MG/5ML  suspension Take 45 mLs by mouth daily as needed for mild constipation.  . metoprolol succinate (TOPROL-XL) 25 MG 24 hr tablet Take 1 tablet (25 mg total) by mouth daily.  . timolol (TIMOPTIC) 0.5 % ophthalmic solution Place 1 drop into both eyes 2 (two) times daily.     No current facility-administered medications on file prior to visit.    Review of Systems Per HPI unless specifically indicated above     Objective:    BP 124/60 mmHg  Pulse 78  Temp(Src) 97.5 F (36.4 C) (Oral)  Wt 201 lb 8 oz (91.4 kg)  SpO2 97%  Wt Readings from Last 3 Encounters:  06/12/14 201 lb 8 oz (91.4 kg)  05/18/14 188 lb (85.276 kg)  05/04/14 187 lb (84.823 kg)    Physical Exam  HENT:  Mouth/Throat: Oropharynx is clear and moist. No oropharyngeal exudate.  Neck: Normal range of motion. Neck supple.  Cardiovascular: Normal rate, regular rhythm, normal heart sounds and intact distal pulses.   No murmur heard. Pulmonary/Chest: Effort normal. No respiratory distress. He has decreased breath sounds in the left lower field. He has no wheezes. He has no rhonchi. He has no rales.  R basilar crackles  Musculoskeletal: He exhibits edema (1+ bilat).  FROM at L shoulder but remains tender to palpation under L axilla  Lymphadenopathy:    He has no cervical adenopathy.  Skin: Skin is warm and dry. No rash noted.  Psychiatric: He has a normal mood and affect.  Nursing note  and vitals reviewed.      Assessment & Plan:   Problem List Items Addressed This Visit    Vitamin D deficiency    Compliant with vit D 2000 IU daily.    Pleural effusion, left    Chronic. Anticipate causing chronic irritant cough, discussed this. Will trial tessalon perls.    Pedal edema    Will start furosemide 20mg  1/2 tablet prn leg swelling. Anticipate multifactorial.    Left shoulder pain    Chronic calcific tendinosis on xray. Continue tylenol, ibuprofen. voltaren ileffective.    Dry cough    See above.    Cervical  radiculitis - Primary    Anticipate L cervical radiculopathy. Check cervical MRI to see if amenable to ESI. Pt would be interested in this. Continue gabapentin, but decrease to bid 2/2 dizziness endorsed.    Relevant Medications      gabapentin (NEURONTIN) capsule   Other Relevant Orders      MR Cervical Spine Wo Contrast    Other Visit Diagnoses    Left arm numbness        Relevant Medications       gabapentin (NEURONTIN) capsule    Other Relevant Orders       MR Cervical Spine Wo Contrast        Follow up plan: No Follow-up on file.

## 2014-06-12 NOTE — Assessment & Plan Note (Addendum)
Anticipate L cervical radiculopathy. Check cervical MRI to see if amenable to ESI. Pt would be interested in this. Continue gabapentin, but decrease to bid 2/2 dizziness endorsed.

## 2014-06-12 NOTE — Assessment & Plan Note (Signed)
Will start furosemide 20mg  1/2 tablet prn leg swelling. Anticipate multifactorial.

## 2014-06-12 NOTE — Progress Notes (Signed)
Pre visit review using our clinic review tool, if applicable. No additional management support is needed unless otherwise documented below in the visit note. 

## 2014-06-12 NOTE — Patient Instructions (Signed)
For cough - I think this is from fluid in left lung causing irritation of lung lining. Try tessalon perls for cough (swallow, don't chew). For left arm pain and numbness - i think this is coming from the spine - pass by Marion's office to schedule further imaging of upper back/neck. For swelling - start lasix (furosemide) water pill 1/2 tablet daily as needed for leg swelling. Take daily for 3 days to start. Keep appointment next week.

## 2014-06-12 NOTE — Assessment & Plan Note (Signed)
Compliant with vit D 2000 IU daily. 

## 2014-06-15 ENCOUNTER — Ambulatory Visit
Admission: RE | Admit: 2014-06-15 | Discharge: 2014-06-15 | Disposition: A | Discharge status: Home / Self Care | Payer: Medicare PPO | Source: Ambulatory Visit | Attending: Family Medicine | Admitting: Family Medicine

## 2014-06-15 ENCOUNTER — Other Ambulatory Visit: Payer: Medicare PPO

## 2014-06-15 DIAGNOSIS — M5412 Radiculopathy, cervical region: Secondary | ICD-10-CM

## 2014-06-15 DIAGNOSIS — R200 Anesthesia of skin: Secondary | ICD-10-CM

## 2014-06-17 ENCOUNTER — Encounter: Payer: Self-pay | Admitting: Family Medicine

## 2014-06-18 ENCOUNTER — Encounter: Payer: Self-pay | Admitting: Family Medicine

## 2014-06-18 ENCOUNTER — Ambulatory Visit (INDEPENDENT_AMBULATORY_CARE_PROVIDER_SITE_OTHER): Payer: Medicare PPO | Admitting: Family Medicine

## 2014-06-18 VITALS — BP 108/62 | HR 64 | Temp 97.5°F | Wt 201.8 lb

## 2014-06-18 DIAGNOSIS — M5412 Radiculopathy, cervical region: Secondary | ICD-10-CM

## 2014-06-18 DIAGNOSIS — J90 Pleural effusion, not elsewhere classified: Secondary | ICD-10-CM

## 2014-06-18 DIAGNOSIS — R0600 Dyspnea, unspecified: Secondary | ICD-10-CM

## 2014-06-18 DIAGNOSIS — J948 Other specified pleural conditions: Secondary | ICD-10-CM

## 2014-06-18 DIAGNOSIS — R600 Localized edema: Secondary | ICD-10-CM

## 2014-06-18 LAB — COMPREHENSIVE METABOLIC PANEL
ALK PHOS: 103 U/L (ref 39–117)
ALT: 9 U/L (ref 0–53)
AST: 17 U/L (ref 0–37)
Albumin: 3.5 g/dL (ref 3.5–5.2)
BUN: 23 mg/dL (ref 6–23)
CHLORIDE: 95 meq/L — AB (ref 96–112)
CO2: 34 mEq/L — ABNORMAL HIGH (ref 19–32)
Calcium: 9.3 mg/dL (ref 8.4–10.5)
Creatinine, Ser: 1.03 mg/dL (ref 0.40–1.50)
GFR: 70.83 mL/min (ref >60.00)
Glucose, Bld: 149 mg/dL — ABNORMAL HIGH (ref 70–99)
Potassium: 4.2 mEq/L (ref 3.5–5.1)
SODIUM: 133 meq/L — AB (ref 135–145)
TOTAL PROTEIN: 6.4 g/dL (ref 6.0–8.3)
Total Bilirubin: 0.4 mg/dL (ref 0.2–1.2)

## 2014-06-18 LAB — BRAIN NATRIURETIC PEPTIDE: PRO B NATRI PEPTIDE: 68 pg/mL (ref 0.0–100.0)

## 2014-06-18 LAB — CBC WITH DIFFERENTIAL/PLATELET
Basophils Absolute: 0 10*3/uL (ref 0.0–0.1)
Basophils Relative: 0.3 % (ref 0.0–3.0)
EOS ABS: 0.1 10*3/uL (ref 0.0–0.7)
Eosinophils Relative: 1.3 % (ref 0.0–5.0)
HEMATOCRIT: 43.5 % (ref 39.0–52.0)
Hemoglobin: 13.9 g/dL (ref 13.0–17.0)
Lymphocytes Relative: 22.9 % (ref 12.0–46.0)
Lymphs Abs: 2.7 10*3/uL (ref 0.7–4.0)
MCHC: 31.8 g/dL (ref 30.0–36.0)
MCV: 90.5 fl (ref 78.0–100.0)
MONO ABS: 1.4 10*3/uL — AB (ref 0.1–1.0)
MONOS PCT: 11.8 % (ref 3.0–12.0)
NEUTROS PCT: 63.7 % (ref 43.0–77.0)
Neutro Abs: 7.5 10*3/uL (ref 1.4–7.7)
PLATELETS: 201 10*3/uL (ref 150.0–400.0)
RBC: 4.81 Mil/uL (ref 4.22–5.81)
RDW: 14.9 % (ref 11.5–15.5)
WBC: 11.7 10*3/uL — ABNORMAL HIGH (ref 4.0–10.5)

## 2014-06-18 MED ORDER — PREDNISONE 20 MG PO TABS: ORAL_TABLET | ORAL | Status: DC

## 2014-06-18 MED ORDER — HYDROCODONE-HOMATROPINE 5-1.5 MG/5ML PO SYRP: 5.0000 mL | ORAL_SOLUTION | Freq: Three times a day (TID) | ORAL | Status: DC | PRN

## 2014-06-18 NOTE — Patient Instructions (Addendum)
Pinching in the neck is causing pain in left arm and under arm. I think the fluid in the lungs is causing your shortness of breath and contributing to cough. We will treat with prednisone course for inflammation in neck nerves and cough suppressant with hydrocodone (printed today). Pass by Marion's office to set you up with back doctor for steroid shot and hospital for draining of fluid.  Continue tylenol and furosemide. Blood work today.

## 2014-06-18 NOTE — Progress Notes (Signed)
Pre visit review using our clinic review tool, if applicable. No additional management support is needed unless otherwise documented below in the visit note. 

## 2014-06-18 NOTE — Assessment & Plan Note (Addendum)
Chronic L pleural effusion since 06/2013, s/p unrevealing thoracentesis 01/2014 - negative fluid culture and cytology.  Given progressively worsening dyspnea and cough will order therapeutic thoracentesis through IR. Pt/family agree with this. Doubt infectious cause but will repeat body fluid culture/cell count. Rpt cytology as well. In interim, try hycodan for cough.

## 2014-06-18 NOTE — Progress Notes (Signed)
BP 108/62 mmHg  Pulse 64  Temp(Src) 97.5 F (36.4 C) (Oral)  Wt 201 lb 12 oz (91.513 kg)  SpO2 95%   CC: f/u visit  Subjective:    Patient ID: Ronald Ball, male    DOB: 07/06/1913, 81 y.o.   MRN: [id number]  HPI: Ronald Ball is a 81 y.o. male presenting on 06/18/2014 for Follow-up   Persistent neck and shoulder pain associated with left axillary pain and left arm numbness. Miserable with pain. Waking up at night time with persistent pain.  See MRI below. Showing DDD and C2-7 L>R facet degeneration with mod-severe foraminal narrowing and some mild central narrowing at C3/4 and C6/7 as well.   Persistent cough present with eating or talking. Tessalon ineffective. Mainly dry cough. No fevers, rare mucous production. Not on ACEI. New worsening dyspnea noted as well. Persistent leg swelling despite lasix 10mg  daily.   MRI CERVICAL SPINE WITHOUT CONTRAST TECHNIQUE: Multiplanar, multisequence MR imaging of the cervical spine was performed. No intravenous contrast was administered.  COMPARISON: Plain films cervical spine 05/18/2014.  FINDINGS: The study is degraded by motion. Vertebral body height is maintained. There 0.4 cm anterolisthesis C5 on C6 due to facet arthropathy. Alignment is otherwise unremarkable. No worrisome marrow lesion is identified. There is a small syrinx within the cervical cord extending from the C6-7 level to the superior endplate of T1. Cervical cord signal is otherwise normal. The craniocervical junction is normal. No abnormality of the paraspinous soft structures is identified.  C2-3: Left worse than right facet degenerative disease. The central canal and right foramen are open. The left foramen is narrowed.  C3-4: Left much worse than right facet arthropathy and a shallow disc bulge are identified. There is mild central canal narrowing. Moderately severe to severe foraminal narrowing is worse on the left.  C4-5: Disc osteophyte  complex uncovertebral disease and facet arthropathy are seen. There is slight flattening of the ventral cord. The foramina are severely narrowed.  C5-6: Bilateral facet arthropathy is present. The disc is uncovered with a shallow bulge and there is uncovertebral disease. Slight flattening of the ventral cord is present. Severe right and moderately severe left foraminal narrowing is identified.  C6-7: There is a disc bulge and uncovertebral disease. Disc effaces the ventral thecal sac causing mild central canal narrowing. Moderately severe bilateral foraminal narrowing is identified.  C7-T1: There is facet degenerative change. No disc bulge or protrusion. The central canal and foramina are open.  IMPRESSION: The study is degraded by patient motion.  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 Cause is not definitively identified but it may be secondary to spondylosis. No mass is seen.  Multilevel cervical spondylosis as detailed above. Multilevel cervical spondylosis as described above.   Electronically Signed  By: Drusilla Kannerhomas Dalessio M.D.  On: 06/15/2014 16:54  Relevant past medical, surgical, family and social history reviewed and updated as indicated. Interim medical history since our last visit reviewed. Allergies and medications reviewed and updated. Current Outpatient Prescriptions on File Prior to Visit  Medication Sig  . acetaminophen (TYLENOL) 500 MG tablet Take 1 tablet (500 mg total) by mouth every 6 (six) hours as needed for mild pain. (Patient taking differently: Take 500 mg by mouth 3 (three) times daily. )  . aspirin EC 81 MG tablet Take 81 mg by mouth every other day.  . Carboxymethylcellulose Sodium (REFRESH PLUS OP) Apply 1 drop to eye 3 (three) times daily as needed (dry  eyes). Both eyes  . cholecalciferol (VITAMIN D) 1000 UNITS tablet Take 1,000 Units by mouth daily.  Marland Kitchen latanoprost (XALATAN) 0.005 % ophthalmic solution Place 1 drop  into the right eye at bedtime.   . metoprolol succinate (TOPROL-XL) 25 MG 24 hr tablet Take 1 tablet (25 mg total) by mouth daily.  . timolol (TIMOPTIC) 0.5 % ophthalmic solution Place 1 drop into both eyes 2 (two) times daily.    . benzonatate (TESSALON) 100 MG capsule Take 1 capsule (100 mg total) by mouth 3 (three) times daily as needed for cough. Swallow, don't chew (Patient not taking: Reported on 06/18/2014)  . furosemide (LASIX) 20 MG tablet Take 0.5 tablets (10 mg total) by mouth daily as needed for edema (leg swelling). (Patient not taking: Reported on 06/18/2014)  . gabapentin (NEURONTIN) 100 MG capsule Take 1 capsule (100 mg total) by mouth 2 (two) times daily.  Marland Kitchen ibuprofen (ADVIL,MOTRIN) 200 MG tablet Take 200 mg by mouth at bedtime.   . magnesium hydroxide (MILK OF MAGNESIA) 400 MG/5ML suspension Take 45 mLs by mouth daily as needed for mild constipation.   No current facility-administered medications on file prior to visit.    Review of Systems Per HPI unless specifically indicated above     Objective:    BP 108/62 mmHg  Pulse 64  Temp(Src) 97.5 F (36.4 C) (Oral)  Wt 201 lb 12 oz (91.513 kg)  SpO2 95%  Wt Readings from Last 3 Encounters:  06/18/14 201 lb 12 oz (91.513 kg)  06/15/14 203 lb (92.08 kg)  06/12/14 201 lb 8 oz (91.4 kg)    Physical Exam  Constitutional: He appears well-developed and well-nourished. No distress.  HENT:  Mouth/Throat: Oropharynx is clear and moist. No oropharyngeal exudate.  Cardiovascular: Normal rate, regular rhythm, normal heart sounds and intact distal pulses.   No murmur heard. Pulmonary/Chest: Effort normal. No respiratory distress. He has decreased breath sounds in the left lower field. He has no wheezes. He has no rhonchi. He has no rales.  Decreased breath sounds at site of known pleural effusion  Musculoskeletal: He exhibits edema (tight pedal edema bilaterally).  Skin: Skin is warm and dry.  Nursing note and vitals reviewed.      Assessment & Plan:   Problem List Items Addressed This Visit    Pleural effusion, left    Chronic L pleural effusion since 06/2013, s/p unrevealing thoracentesis 01/2014 - negative fluid culture and cytology.  Given progressively worsening dyspnea and cough will order therapeutic thoracentesis through IR. Pt/family agree with this. Doubt infectious cause but will repeat body fluid culture/cell count. Rpt cytology as well. In interim, try hycodan for cough.      Relevant Orders   US THORACENTESIS ASP PLEURAL SPACE W/IMG GUIDE   Pedal edema    Progressively worsening - continue furosemide  daily. Check BNP and Cr today.      Relevant Orders   Comprehensive metabolic panel (Completed)   Brain natriuretic peptide (Completed)   Cervical radiculitis - Primary    MRI showing significant C2-7 L>R facet degeneration with mod-severe foraminal narrowing. Persistent significant debilitating pain.  Tylenol helpful. Unable to tolerate stronger pain meds (tramadol or narcotics), NSAIDs caused nausea, and gabapentin ineffective. Will try prednisone course and refer to PM&R to review for [facility] eligibility, pt willing.      Relevant Orders   Ambulatory referral to Spine Surgery    Other Visit Diagnoses    Dyspnea        Relevant Orders  Comprehensive metabolic panel (Completed)    Brain natriuretic peptide (Completed)    CBC with Differential (Completed)    US THORACENTESIS ASP PLEURAL SPACE W/IMG GUIDE        Follow up plan: Return if symptoms worsen or fail to improve.

## 2014-06-18 NOTE — Assessment & Plan Note (Signed)
Progressively worsening - continue furosemide 10mg  daily. Check BNP and Cr today.

## 2014-06-18 NOTE — Assessment & Plan Note (Signed)
MRI showing significant C2-7 L>R facet degeneration with mod-severe foraminal narrowing. Persistent significant debilitating pain.  Tylenol helpful. Unable to tolerate stronger pain meds (tramadol or narcotics), NSAIDs caused nausea, and gabapentin ineffective. Will try prednisone course and refer [facility] PM[facility]R [facility], pt [facility].

## 2014-06-20 ENCOUNTER — Ambulatory Visit (HOSPITAL_COMMUNITY): Payer: Medicare PPO

## 2014-06-20 ENCOUNTER — Ambulatory Visit (HOSPITAL_COMMUNITY)
Admission: RE | Admit: 2014-06-20 | Discharge: 2014-06-20 | Disposition: A | Discharge status: Home / Self Care | Payer: Medicare PPO | Source: Ambulatory Visit | Attending: Radiology | Admitting: Radiology

## 2014-06-20 ENCOUNTER — Ambulatory Visit (HOSPITAL_COMMUNITY)
Admission: RE | Admit: 2014-06-20 | Discharge: 2014-06-20 | Disposition: A | Discharge status: Home / Self Care | Payer: Medicare PPO | Source: Ambulatory Visit | Attending: Family Medicine | Admitting: Family Medicine

## 2014-06-20 DIAGNOSIS — R0600 Dyspnea, unspecified: Secondary | ICD-10-CM | POA: Diagnosis not present

## 2014-06-20 DIAGNOSIS — Z9889 Other specified postprocedural states: Secondary | ICD-10-CM | POA: Diagnosis not present

## 2014-06-20 DIAGNOSIS — J90 Pleural effusion, not elsewhere classified: Secondary | ICD-10-CM

## 2014-06-20 DIAGNOSIS — J948 Other specified pleural conditions: Secondary | ICD-10-CM | POA: Insufficient documentation

## 2014-06-20 LAB — BODY FLUID CELL COUNT WITH DIFFERENTIAL
Lymphs, Fluid: 12 %
Monocyte-Macrophage-Serous Fluid: 70 % (ref 50–90)
Neutrophil Count, Fluid: 18 % (ref 0–25)
WBC FLUID: 58 uL (ref 0–1000)

## 2014-06-20 NOTE — Procedures (Signed)
Successful US guided left thoracentesis. Yielded 1.3 liters of serous fluid. Pt tolerated procedure well. No immediate complications. Procedure stopped early with remaining pleural fluid left behind secondary to pain and excessive coughing.  Specimen was sent for labs. CXR ordered.  Pattricia BossMORGAN, Stephana Morell D PA-C 06/20/2014 1:34 PM

## 2014-06-21 ENCOUNTER — Telehealth: Payer: Self-pay | Admitting: Family Medicine

## 2014-06-21 NOTE — Telephone Encounter (Signed)
Attempted to call. Still busy. Will try again later.

## 2014-06-21 NOTE — Telephone Encounter (Signed)
Can we call for f/u? I tried calling and it's busy signal.

## 2014-06-21 NOTE — Telephone Encounter (Signed)
Spouse called stating mr Ronald Ball has been coughing since 10:30 this morning.   molene stated she gave him both cough med that dr g recommended.  He had fluid drained off his lungs yesterday  [facility]   molene didn't leave a message for them to call her back she wants dr g to call her back

## 2014-06-21 NOTE — Telephone Encounter (Signed)
S/p thoracentesis yesterday. Initially felt well, but since this morning now worsening cough with fits, no dyspnea, no fever.  Last hycodan cough syrup was 12 noon. Ok to give another dose of hycodan cough syrup now. If cough not improving, advised take him to ER for evaluation to r/o developing infection in setting of tomorrow's and this weekend's anticipated inclement weather.  Wife agrees with plan.

## 2014-06-21 NOTE — Telephone Encounter (Signed)
Spoke with wife, coughing has slowed down. No dyspnea. Aware to seek urgent care if worsening over weekend.

## 2014-06-23 LAB — BODY FLUID CULTURE
CULTURE: NO GROWTH
Gram Stain: NONE SEEN

## 2014-07-02 ENCOUNTER — Ambulatory Visit: Payer: Medicare PPO | Admitting: Family Medicine

## 2014-07-06 ENCOUNTER — Encounter: Payer: Self-pay | Admitting: Family Medicine

## 2014-07-06 ENCOUNTER — Ambulatory Visit (INDEPENDENT_AMBULATORY_CARE_PROVIDER_SITE_OTHER): Payer: Medicare PPO | Admitting: Family Medicine

## 2014-07-06 VITALS — BP 124/62 | HR 80 | Temp 97.5°F | Wt 189.0 lb

## 2014-07-06 DIAGNOSIS — R200 Anesthesia of skin: Secondary | ICD-10-CM

## 2014-07-06 DIAGNOSIS — R05 Cough: Secondary | ICD-10-CM

## 2014-07-06 DIAGNOSIS — J90 Pleural effusion, not elsewhere classified: Secondary | ICD-10-CM

## 2014-07-06 DIAGNOSIS — R053 Chronic cough: Secondary | ICD-10-CM

## 2014-07-06 DIAGNOSIS — K5900 Constipation, unspecified: Secondary | ICD-10-CM

## 2014-07-06 DIAGNOSIS — M5412 Radiculopathy, cervical region: Secondary | ICD-10-CM

## 2014-07-06 DIAGNOSIS — R600 Localized edema: Secondary | ICD-10-CM

## 2014-07-06 DIAGNOSIS — R208 Other disturbances of skin sensation: Secondary | ICD-10-CM

## 2014-07-06 DIAGNOSIS — K5909 Other constipation: Secondary | ICD-10-CM

## 2014-07-06 MED ORDER — HYDROCODONE-ACETAMINOPHEN 5-325 MG PO TABS: 0.5000 | ORAL_TABLET | Freq: Three times a day (TID) | ORAL | Status: DC | PRN

## 2014-07-06 MED ORDER — GABAPENTIN 100 MG PO CAPS: 200.0000 mg | ORAL_CAPSULE | Freq: Every day | ORAL | Status: DC

## 2014-07-06 MED ORDER — DOCUSATE SODIUM 100 MG PO CAPS: 100.0000 mg | ORAL_CAPSULE | Freq: Every day | ORAL | Status: AC

## 2014-07-06 NOTE — Progress Notes (Signed)
Pre visit review using our clinic review tool, if applicable. No additional management support is needed unless otherwise documented below in the visit note. 

## 2014-07-06 NOTE — Patient Instructions (Addendum)
Let's use hydrocodone 1/2 - 1 tablet two to three times daily as needed for pain.  Start colace 100mg  over the counter daily. Increase gabapentin to 200mg  nightly for 3 days and if tolerated well, increase to 300mg  nightly. Good to see you today.

## 2014-07-06 NOTE — Progress Notes (Signed)
BP 124/62 mmHg  Pulse 80  Temp(Src) 97.5 F (36.4 C) (Oral)  Wt 189 lb (85.73 kg)   CC: recheck arm  Subjective:    Patient ID: Conley RollsWilliam W Behe, male    DOB: 12/19/1913, 81 y.o.   MRN: [id number]  HPI: Conley RollsWilliam W Emry is a 81 y.o. male presenting on 07/06/2014 for Follow-up   As is the norm, presents with wife and son. Known L cervical radiculitis by MRI (multilevel cervical spondylosis) discussed cervical interlaminar epidural injection C7/T1 by Dr Alvester MorinNewton late last month. Worried about complications of injection so has not scheduled this yet.  Persistent back, posterior L scapular pain, with pain/numbness radiating down entire L arm and hand, laying down worsens pain, sitting upright improves pain. Unable to sleep flat at night. Has not been taking gabapentin.  He also had thoracocentesis last month which he tolerated well and it seemed to help with his chronic cough and dyspnea. Cough slowly returning, correlates with whenever he tries to talk.   Declines flu shot today. Has never received.  Relevant past medical, surgical, family and social history reviewed and updated as indicated. Interim medical history since our last visit reviewed. Allergies and medications reviewed and updated. Current Outpatient Prescriptions on File Prior to Visit  Medication Sig  . acetaminophen (TYLENOL) 500 MG tablet Take 1 tablet (500 mg total) by mouth every 6 (six) hours as needed for mild pain. (Patient taking differently: Take 500 mg by mouth 3 (three) times daily. )  . aspirin EC 81 MG tablet Take 81 mg by mouth every other day.  . benzonatate (TESSALON) 100 MG capsule Take 1 capsule (100 mg total) by mouth 3 (three) times daily as needed for cough. Swallow, don't chew  . Carboxymethylcellulose Sodium (REFRESH PLUS OP) Apply 1 drop to eye 3 (three) times daily as needed (dry eyes). Both eyes  . cholecalciferol (VITAMIN D) 1000 UNITS tablet Take 1,000 Units by mouth daily.  . furosemide  (LASIX) 20 MG tablet Take 0.5 tablets (10 mg total) by mouth daily as needed for edema (leg swelling).  Marland Kitchen. HYDROcodone-homatropine (HYCODAN) 5-1.5 MG/5ML syrup Take 5 mLs by mouth every 8 (eight) hours as needed for cough.  Marland Kitchen. ibuprofen (ADVIL,MOTRIN) 200 MG tablet Take 200 mg by mouth at bedtime.   Marland Kitchen. latanoprost (XALATAN) 0.005 % ophthalmic solution Place 1 drop into the right eye at bedtime.   . magnesium hydroxide (MILK OF MAGNESIA) 400 MG/5ML suspension Take 45 mLs by mouth daily as needed for mild constipation.  . metoprolol succinate (TOPROL-XL) 25 MG 24 hr tablet Take 1 tablet (25 mg total) by mouth daily.  . timolol (TIMOPTIC) 0.5 % ophthalmic solution Place 1 drop into both eyes 2 (two) times daily.     No current facility-administered medications on file prior to visit.    Review of Systems Per HPI unless specifically indicated above     Objective:    BP 124/62 mmHg  Pulse 80  Temp(Src) 97.5 F (36.4 C) (Oral)  Wt 189 lb (85.73 kg)  Wt Readings from Last 3 Encounters:  07/06/14 189 lb (85.73 kg)  06/18/14 201 lb 12 oz (91.513 kg)  06/15/14 203 lb (92.08 kg)    Physical Exam  Constitutional: He appears well-developed and well-nourished. No distress.  Elderly sitting in wheelchair  HENT:  Mouth/Throat: Oropharynx is clear and moist. No oropharyngeal exudate.  Cardiovascular: Normal rate, normal heart sounds and intact distal pulses.  A regularly irregular rhythm present.  No murmur heard. Pulmonary/Chest:  Effort normal. No respiratory distress. He has decreased breath sounds (LLL). He has no wheezes. He has no rales.  Musculoskeletal: He exhibits edema (1+ bilat pitting).  Tender around L scapula but no midline thoracic or cervical spine pain. Some lower lumbar midline spine tenderness present today  Skin: Skin is warm and dry. No rash noted.  Nursing note and vitals reviewed.  Results for orders placed or performed during the hospital encounter of 06/20/14  Body fluid  culture  Result Value Ref Range   Specimen Description PLEURAL    Special Requests NONE    Gram Stain      NO WBC SEEN NO ORGANISMS SEEN Performed at [facility]    Culture      NO GROWTH 3 DAYS Performed at [facility]    Report Status 06/23/2014 FINAL   Body fluid cell count with differential  Result Value Ref Range   Fluid Type-FCT PLEURAL    Color, Fluid YELLOW YELLOW   Appearance, Fluid CLOUDY (A) CLEAR   WBC, Fluid 58 0 - 1000 cu mm   Neutrophil Count, Fluid 18 0 - 25 %   Lymphs, Fluid 12 %   Monocyte-Macrophage-Serous Fluid 70 50 - 90 %   Other Cells, Fluid CORRELATE WITH CYTOLOGY. %      Assessment & Plan:   Problem List Items Addressed This Visit    Recurrent left pleural effusion    Continue to monitor. Hycodan Q4 hours effective to control cough.      Pedal edema    Stable on furosemide  daily.      Chronic cough    Returning cough after thoracentesis. Aware rpt tap is an option. Anticipate cough coming from diaphragmatic irritation of L chronic pleural effusion. No infection symptoms.      Chronic constipation    We will retrial hydrocodone. Continue MOM and add colace.       Relevant Medications   docusate sodium (COLACE) capsule   Cervical radiculitis - Primary    Patient and family spoke with Dr Alvester Morin regarding possible interlaminar epidural injections, but patient remains worried about risks of procedure and possible lack of effect. Discussed this, ultimately his choice after weighing pros/cons, but I did encourage him to pursue as I think it will be overall beneficial.  In interim, discussed slow taper of gabapentin to  nightly and will restart low dose hydrocodone 5/325 (1/2-1 tab tid prn) for breakthrough pain despite tylenol. Also recommended colace  daily. He also has MOM available. miralax prior was too strong and led to bowel accidents. We also briefly discussed hospital bed as an option to facilitate upright  sleep - he will consider this.      Relevant Medications   gabapentin (NEURONTIN) capsule    Other Visit Diagnoses    Left arm numbness        Relevant Medications    gabapentin (NEURONTIN) capsule        Follow up plan: No Follow-up on file.

## 2014-07-07 NOTE — Assessment & Plan Note (Addendum)
Patient and family spoke with Dr Alvester MorinNewton regarding possible interlaminar epidural injections, but patient remains worried about risks of procedure and possible lack of effect. Discussed this, ultimately his choice after weighing pros/cons, but I did encourage him to pursue as I think it will be overall beneficial.  In interim, discussed slow taper of gabapentin to 300mg  nightly and will restart low dose hydrocodone 5/325 (1/2-1 tab tid prn) for breakthrough pain despite tylenol. Also recommended colace 100mg  daily. He also has MOM available. miralax prior was too strong and led to bowel accidents. We also briefly discussed hospital bed as an option to facilitate upright sleep - he will consider this.

## 2014-07-07 NOTE — Assessment & Plan Note (Addendum)
Continue to monitor. Hycodan Q4 hours effective to control cough.

## 2014-07-07 NOTE — Assessment & Plan Note (Signed)
Stable on furosemide 10mg  daily.

## 2014-07-07 NOTE — Assessment & Plan Note (Signed)
We will retrial hydrocodone. Continue MOM and add colace.

## 2014-07-07 NOTE — Assessment & Plan Note (Signed)
Returning cough after thoracentesis. Aware rpt tap is an option. Anticipate cough coming from diaphragmatic irritation of L chronic pleural effusion. No infection symptoms.

## 2014-07-20 ENCOUNTER — Ambulatory Visit: Payer: Self-pay | Admitting: Family Medicine

## 2014-07-20 ENCOUNTER — Telehealth: Payer: Self-pay | Admitting: Family Medicine

## 2014-07-20 DIAGNOSIS — R200 Anesthesia of skin: Secondary | ICD-10-CM

## 2014-07-20 DIAGNOSIS — M5412 Radiculopathy, cervical region: Secondary | ICD-10-CM

## 2014-07-20 DIAGNOSIS — L89159 Pressure ulcer of sacral region, unspecified stage: Secondary | ICD-10-CM

## 2014-07-20 MED ORDER — GABAPENTIN 100 MG PO CAPS: 300.0000 mg | ORAL_CAPSULE | Freq: Every day | ORAL | Status: DC

## 2014-07-20 NOTE — Addendum Note (Signed)
Addended by: Eustaquio BoydenGUTIERREZ, Chantrell Apsey on: 07/20/2014 10:22 AM   Modules accepted: Orders, Medications

## 2014-07-20 NOTE — Telephone Encounter (Signed)
Pt has appt today at 4pm with Dr G. 

## 2014-07-20 NOTE — Telephone Encounter (Signed)
Spoke with daughter Richmond CampbellJaimie. Recommend tylenol 1000mg  tid during the day and try hydrocodone 5/325mg  2 at night time. Will also increase gabapentin to 300mg  nightly. plz cancel appt. They also mention he has bed sores that have developed on buttock - recommend vaseline dressing changes and will ask HH wound care to come out to house to evaluate.

## 2014-07-20 NOTE — Telephone Encounter (Signed)
[facility] Client TELEPHONE ADVICE RECORD [facility] Patient Name: Ronald JaegerWILLIAM Ibach DOB: 08/02/1913 Initial Comment Caller, [name] - dtr, states the patient is having shoulder pain and the meds are not helping the pain. Nurse Assessment Nurse: Lane HackerHarley, RN, Elvin SoWindy Date/Time (Eastern Time): 07/20/2014 9:34:14 AM Confirm and document reason for call. If symptomatic, describe symptoms. ---Caller states the patient is having shoulder pain into left arm, and the meds are not helping the pain from about 8 pm til around 6 am. Seen by PCP several times over last 3 months, and taking Gabapentin - 2 at HS for the pain, and also given Hydrocodone 1/2 - 1 tab TID PRN moderate pain (has only taken at HS thus far). He is taking ES Tylenol thru the day along with the Hydrocodone. He has an older script for Tramadol 50 mg PRN (he has not taken it). -RN advised not to take the Hydrocodone and Tramadol together w/o discussing with MD. Caller agreeable and verbalized understanding. She asks is there a stronger med that can be prescribed at night as he is waking 3-4 times a night, restless. Has the patient traveled out of the country within the last 30 days? ---Not Applicable Does the patient require triage? ---Yes Related visit to physician within the last 2 weeks? ---No Does the PT have any chronic conditions? (i.e. diabetes, asthma, etc.) ---Yes List chronic conditions. ---HTN Guidelines Guideline Title Affirmed Question Affirmed Notes Shoulder Pain Numbness (i.e., loss of sensation) in hand or fingers sometimes is made worse with movement, sometimes not; feels strongly it is muscular and worse at night. Able to control pain during the day with Tylenol. Final Disposition User See Physician within [address]24 Hours [place], [place], Elvin SoWindy Comments Caller is requesting something stronger at night to control the pain better at night. She states that Dr. [name]  said there is nothing more that he can do.  Appt made for 4 pm with PCP.

## 2014-07-20 NOTE — Telephone Encounter (Signed)
Appt cancelled

## 2014-07-23 ENCOUNTER — Other Ambulatory Visit: Payer: Self-pay | Admitting: Family Medicine

## 2014-07-24 ENCOUNTER — Telehealth: Payer: Self-pay | Admitting: Family Medicine

## 2014-07-24 NOTE — Telephone Encounter (Signed)
Amy @ home health would like you to call her regarding mr Titsworth Tomorrow would fine to call her back

## 2014-07-25 MED ORDER — FUROSEMIDE 20 MG PO TABS: 20.0000 mg | ORAL_TABLET | Freq: Every day | ORAL | Status: DC

## 2014-07-25 MED ORDER — POTASSIUM CHLORIDE ER 10 MEQ PO TBCR: 10.0000 meq | EXTENDED_RELEASE_TABLET | ORAL | Status: AC

## 2014-07-25 NOTE — Telephone Encounter (Signed)
Amy with [facility] went to see patient yesterday. She said on her eval he has edema up to his thighs and his legs and abd felt tight, but his scrotum was normal. She found out that he is eating chicken noodle soup daily and is concerned about sodium intake. She talked to him about this and he refuses to change this. She also said his heartrate was very irregular and he told her no one had ever told him that he had A-fib (even though it's in his chart). His decubitus ulcers are now Stage 2 and she has placed a duoderm on them and some barrier cream to his creases as they were becoming red. She said he appears stable and says he feels fine but she wasn't sure if this is his baseline or if he is possibly getting close to another hospital admission. She was asking about possibly increasing his lasix. He is currently taking it everyday and has also been taking the lisinopril/HCTZ that he was supposed to have stopped last September when he was in the hospital. I am faxing her the current med list that we have for him so that she can make sure he is taking what is supposed to take. She is working on getting him a scale at home so that he can weigh. He hasn't been able to weigh here the last few times due to weakness, however. His son is aware of everything she discussed (mainly diet and position changes, but patient is very reluctant to them) She also mentioned if he is just wanting to be comfortable, should hospice be considered or is he even a candidate?

## 2014-07-25 NOTE — Telephone Encounter (Signed)
Agree. Thanks

## 2014-07-25 NOTE — Telephone Encounter (Signed)
Amy notified. I went ahead and gave the ok to draw a BMET next week due to the increase in lasix to check his kidney function. Let me know if you don't want her to do it.

## 2014-07-25 NOTE — Telephone Encounter (Signed)
He would be hospice eligible but that is not a discussion I have had with them. If pt and family amenable to this [facility] be [facility]. Regarding leg swelling - let's increase lasix to 20mg  daily and would add Kdur 10mEq MWF. Ensure good UOP with increased lasix. Agree with off ACEI/HCTZ for now, watch BP off this med.  Update us in 1 week with effect on pedal edema as well as blood pressure with above changes.

## 2014-07-30 ENCOUNTER — Telehealth: Payer: Self-pay | Admitting: *Deleted

## 2014-07-30 NOTE — Telephone Encounter (Signed)
Thanks. i'm not clear - she needs wound care orders or she will get wound care orders from [facility]?

## 2014-07-30 NOTE — Telephone Encounter (Signed)
Ronald Ball with [facility] reports that this patient fell today around 12 noon.  There does not appear to be any injuries other than 2 skin tears to the right arm.  Ronald Ball says that she will be getting orders for wound care.

## 2014-07-30 NOTE — Telephone Encounter (Signed)
Is this the correct patient? He was with [facility] not [facility] last week. I just spoke with Vernona [facility]Laura [facility] and she said Mr. Sedonia SmallRoberson wasn't her patient. I'm a little confused.

## 2014-07-30 NOTE — Telephone Encounter (Signed)
This is the correct patient but I put the wrong [facility] and nurse.  It should have read:  Amy with [facility] rather than Vernona RiegerLaura with [facility].  I had been in touch with both [facility] companies within a few minutes time and Amy caught me with this message to give to Dr. [name] while I was on the phone with her for one of my patients.  Then when I wrote up the note, I mistakenly used the wrong [facility] and nurse.  However, this is the correct patient, I verified the DOB when I took the message.

## 2014-07-30 NOTE — Telephone Encounter (Signed)
If she needs the wound care orders from you, she will call in for them.  Otherwise, possibly through [facility].

## 2014-08-02 ENCOUNTER — Encounter: Payer: Self-pay | Admitting: Family Medicine

## 2014-08-07 ENCOUNTER — Telehealth: Payer: Self-pay | Admitting: Family Medicine

## 2014-08-07 DIAGNOSIS — E119 Type 2 diabetes mellitus without complications: Secondary | ICD-10-CM | POA: Diagnosis not present

## 2014-08-07 DIAGNOSIS — M5412 Radiculopathy, cervical region: Secondary | ICD-10-CM | POA: Diagnosis not present

## 2014-08-07 DIAGNOSIS — I480 Paroxysmal atrial fibrillation: Secondary | ICD-10-CM | POA: Diagnosis not present

## 2014-08-07 DIAGNOSIS — L89150 Pressure ulcer of sacral region, unstageable: Secondary | ICD-10-CM | POA: Diagnosis not present

## 2014-08-07 MED ORDER — FUROSEMIDE 20 MG PO TABS: 20.0000 mg | ORAL_TABLET | Freq: Every day | ORAL | Status: DC

## 2014-08-07 NOTE — Telephone Encounter (Signed)
Amy called regarding pt. Please call her back at [phone]. Thanks.

## 2014-08-07 NOTE — Telephone Encounter (Signed)
Called [facility] with [facility]. She stated that the patient is almost out of hydrocodone-acetaminophen and request for refill. Will need to call family so they can pick up rx. Amy asked since patient 5 lbs weight gain recently. Amy have asked family to weekly weight the patient so they could get a baseline. She also ask if would allow to add extra 10 mg of lasix when there is a dramaic weight gain.

## 2014-08-07 NOTE — Telephone Encounter (Deleted)
Called Ronald Ball. She stated that

## 2014-08-07 NOTE — Telephone Encounter (Signed)
Would change lasix dose from 20mg  daily to 20mg  daily with extra dose prn weight gain >3 lbs in one day or >5 lbs in 1 week. Ok to refill hydrocodone but will have to wait until tomorrow. Lab Results  Component Value Date   CREATININE 1.03 06/18/2014

## 2014-08-08 MED ORDER — HYDROCODONE-ACETAMINOPHEN 5-325 MG PO TABS: 1.0000 | ORAL_TABLET | Freq: Three times a day (TID) | ORAL | Status: DC | PRN

## 2014-08-08 NOTE — Telephone Encounter (Signed)
Message left notifying Amy about lasix and son notified about hydrocodone. Rx placed up front for pick up.

## 2014-08-08 NOTE — Telephone Encounter (Signed)
printed and in Kim's box. 

## 2014-08-09 ENCOUNTER — Telehealth: Payer: Self-pay | Admitting: *Deleted

## 2014-08-09 NOTE — Telephone Encounter (Addendum)
Amy from [facility] called. She said patient has developed a cough and his hands are swollen. His weight is 195 and girth up 2 cm even with the extra fluid pill. Pulse ox 93% at rest on room air. He has crackles and still has the diminished lungs sounds to LLL. She is concerned he may be headed back to a hospital stay if not treated. She was asking to do a BMP, BNP and portable CXR. I advised to go ahead and draw labs today to also include a CBC and scheduled him to come in for an appt to see you tomorrow for CXR since we would get the read before she would. She verbalized understanding and will have the labs run stat and results sent here tomorrow for his appt.

## 2014-08-09 NOTE — Telephone Encounter (Signed)
error 

## 2014-08-09 NOTE — Telephone Encounter (Signed)
Will see tomorrow

## 2014-08-10 ENCOUNTER — Encounter: Payer: Self-pay | Admitting: Family Medicine

## 2014-08-10 ENCOUNTER — Ambulatory Visit (INDEPENDENT_AMBULATORY_CARE_PROVIDER_SITE_OTHER): Payer: Medicare PPO | Admitting: Family Medicine

## 2014-08-10 ENCOUNTER — Ambulatory Visit (INDEPENDENT_AMBULATORY_CARE_PROVIDER_SITE_OTHER)
Admission: RE | Admit: 2014-08-10 | Discharge: 2014-08-10 | Disposition: A | Discharge status: Home / Self Care | Payer: Medicare PPO | Source: Ambulatory Visit | Attending: Family Medicine | Admitting: Family Medicine

## 2014-08-10 VITALS — BP 112/64 | HR 82 | Temp 97.8°F | Wt 203.2 lb

## 2014-08-10 DIAGNOSIS — J90 Pleural effusion, not elsewhere classified: Secondary | ICD-10-CM

## 2014-08-10 DIAGNOSIS — R601 Generalized edema: Secondary | ICD-10-CM

## 2014-08-10 NOTE — Patient Instructions (Addendum)
Increase water. Increase lasix to 40mg  daily in the morning. Try this for 2 days. If urine output doesn't improve, increase to 60 mg daily for 2 days and call me with an update. Xray today.

## 2014-08-10 NOTE — Progress Notes (Signed)
Pre visit review using our clinic review tool, if applicable. No additional management support is needed unless otherwise documented below in the visit note. 

## 2014-08-10 NOTE — Progress Notes (Signed)
BP 112/64 mmHg  Pulse 82  Temp(Src) 97.8 F (36.6 C) (Oral)  Wt 203 lb 4 oz (92.194 kg)  SpO2 97%   CC: HH f/u concerns  Subjective:    Patient ID: Ronald Ball, male    DOB: [birth year], 81 y.o.   MRN: [id number]  HPI: Ronald Ball is a 81 y.o. male presenting on 08/10/2014 for Edema   Presents today with concern per [facility] nurse of developing cough, L hand swelling, weight gain and increase in girth despite increase in lasix to  bid (started today).  Noticing swelling over last several weeks. Hand swelling started on Monday. This morning noticed blurry vision. Denies dyspnea outside of baseline, new wheezing, abd pain.   Reviewed labs and asked to scan - normal BNP, BMP stable and CBC stable. Lasix  provides urgency but still not making much urine. Decreased UOP noted. Normal stools. Doesn't feel significant urinary retention.   Persistent L arm and L shoulder-blade neuralgias. Good protein in diet. Good appetite.  14 lb weight gain noted in past month.  Relevant past medical, surgical, family and social history reviewed and updated as indicated. Interim medical history since our last visit reviewed. Allergies and medications reviewed and updated. Current Outpatient Prescriptions on File Prior to Visit  Medication Sig  . acetaminophen (TYLENOL) 500 MG tablet Take 2 tablets (1,000 mg total) by mouth 3 (three) times daily.  Marland Kitchen aspirin EC 81 MG tablet Take 81 mg by mouth every other day.  . benzonatate (TESSALON) 100 MG capsule Take 1 capsule (100 mg total) by mouth 3 (three) times daily as needed for cough. Swallow, don't chew  . Carboxymethylcellulose Sodium (REFRESH PLUS OP) Apply 1 drop to eye 3 (three) times daily as needed (dry eyes). Both eyes  . cholecalciferol (VITAMIN D) 1000 UNITS tablet Take 1,000 Units by mouth daily.  . furosemide (LASIX) 20 MG tablet Take 1 tablet (20 mg total) by mouth daily. Extra dose prn weight gain >3 lbs in 1 day or >5 lbs in 1  wk  . gabapentin (NEURONTIN) 100 MG capsule Take 3 capsules (300 mg total) by mouth at bedtime.  Marland Kitchen HYDROcodone-acetaminophen (NORCO/VICODIN) 5-325 MG per tablet Take 1-2 tablets by mouth 3 (three) times daily as needed for moderate pain. (Patient taking differently: Take 2 tablets by mouth at bedtime. )  . ibuprofen (ADVIL,MOTRIN) 200 MG tablet Take 200 mg by mouth at bedtime.   Marland Kitchen latanoprost (XALATAN) 0.005 % ophthalmic solution Place 1 drop into the right eye at bedtime.   . metoprolol succinate (TOPROL-XL) 25 MG 24 hr tablet Take 1 tablet (25 mg total) by mouth daily.  . potassium chloride (K-DUR) 10 MEQ tablet Take 1 tablet (10 mEq total) by mouth every Monday, Wednesday, and Friday.  . timolol (TIMOPTIC) 0.5 % ophthalmic solution Place 1 drop into both eyes 2 (two) times daily.    Marland Kitchen docusate sodium (COLACE) 100 MG capsule Take 1 capsule (100 mg total) by mouth daily. (Patient not taking: Reported on 08/10/2014)  . HYDROcodone-homatropine (HYCODAN) 5-1.5 MG/5ML syrup Take 5 mLs by mouth every 8 (eight) hours as needed for cough. (Patient not taking: Reported on 08/10/2014)  . magnesium hydroxide (MILK OF MAGNESIA) 400 MG/5ML suspension Take 45 mLs by mouth daily as needed for mild constipation.   No current facility-administered medications on file prior to visit.   Past Medical History  Diagnosis Date  . BPH (benign prostatic hyperplasia)   . Diverticulosis of colon   . History  of barium enema 07/1991    Diverticuli  . History of CT scan of abdomen 08/14/05    abd/pelvis with and without R adrenal adenoma-stable  . Hyperlipidemia   . Glaucoma   . Hypertension   . Chronic back pain 2007    MRI scan of 03/15/06 revealed severe central canal stenosis at L4-5. He had extensive facet hypertrophy bilaterally. s/p ESI  . Constipation 11/2013    ER visit with miralax treatment  . Diabetes type 2, controlled 01/2014    goal A1c <8  . Intertrochanteric fracture of left hip 01/2014    s/p  surgery  . Pleural effusion on left 2015    longstanding, pt had declined further eval in past, tapped during hospitalization - likely exudative  . PROSTATITIS, HX OF 12/30/2006    Qualifier: Diagnosis of  By: Hetty Ely MD, Franne Grip   . L1 vertebral fracture 01/11/2014    L1 by MRI - acute 25% loss, inferior osteonecrotic cavity, similar area suspected S1 (12/2013)   . Spinal stenosis at L4-L5 level 01/18/2014    By MRI - multilevel DDD with most severe changes at L4/5 with central and R disc extrusion results in severe central canal stenosis and R>L nerve root impingement (12/2013)   . DIVERTICULOSIS, COLON 07/27/2007  . Hemorrhoid 03/18/2014  . DDD (degenerative disc disease), cervical 05/2014    MRI: C2-7 L>R facet degeneration with mod-severe foraminal narrowing  . Syringomyelia 06/2014    small syrinx by MRI unclear cause    Past Surgical History  Procedure Laterality Date  . Prostate biopsy  07/13/98    benign, cytoscopy wnl  . Shoulder surgery Right 04/03    [facility]  . Cataract extraction Left 04/01  . Cataract extraction Right 01/04   . Intramedullary (im) nail intertrochanteric Left 01/2014    [facility]  . Intramedullary (im) nail intertrochanteric Left 02/17/2014    Procedure: INTRAMEDULLARY (IM) NAIL INTERTROCHANTRIC;  Surgeon: Nadara Mustard, MD;  Location: [facility];  Service: Orthopedics;  Laterality: Left;   Review of Systems Per HPI unless specifically indicated above     Objective:    BP 112/64 mmHg  Pulse 82  Temp(Src) 97.8 F (36.6 C) (Oral)  Wt 203 lb 4 oz (92.194 kg)  SpO2 97%  Wt Readings from Last 3 Encounters:  08/10/14 203 lb 4 oz (92.194 kg)  07/06/14 189 lb (85.73 kg)  06/18/14 201 lb 12 oz (91.513 kg)    Physical Exam  Constitutional: He appears well-developed and well-nourished.  Elderly in wheelchair Not dyspneic  HENT:  Head: Normocephalic and atraumatic.  Mouth/Throat: Oropharynx is clear and moist. No oropharyngeal exudate.  Neck: Normal range  of motion. Neck supple. No thyromegaly present.  Cardiovascular: Normal rate, regular rhythm, normal heart sounds and intact distal pulses.   No murmur heard. Pulmonary/Chest: Effort normal. No respiratory distress. He has decreased breath sounds in the left upper field, the left middle field and the left lower field. He has no wheezes. He has no rhonchi. He has no rales.  L lung breath sounds absent  Musculoskeletal: He exhibits edema (L>R upper and lower extremities).  diminshed pulses BLE  Skin: Skin is warm and dry. There is erythema (lower legs).  Psychiatric: He has a normal mood and affect.  Nursing note and vitals reviewed.     Assessment & Plan:   Problem List Items Addressed This Visit    Recurrent left pleural effusion    Recheck xray today - marked white out of  entire left lung field. Anticipate this is what is leading to progressing L sided swelling. Discussed option of repeat thoracentesis. However given pt is currently not dyspneic or with worsening cough, decides to decline this option for now. Previously has declined further workup of recurrent L pleural effusion. Multiple therapeutic and diagnostic taps have been unrevealing regarding etiology of pleural effusion.      Relevant Orders   DG Chest 2 View (Completed)   Anasarca - Primary    L>R side of body. Anticipate related to recurrent L pleural effusion. Labwork overall stable.  ?mass effect from large L pleural effusion causing some cardiac compression as well as venous compression leading to progressive L sided edema.  Pt at this time declines rpt thoracentesis - states still hurts from prior tap.  Will slowly increase lasix to 40 then 60 mg over next 6 days - kidneys have tolerated 20mg  dose well. Endorses good appetite and good protein intake. Check Alb today - added to yesterday's labwork from [facility]. Consider chest CT vs hospice referral.          Follow up plan: Return if symptoms worsen or fail to  improve.

## 2014-08-11 ENCOUNTER — Encounter: Payer: Self-pay | Admitting: Family Medicine

## 2014-08-11 NOTE — Assessment & Plan Note (Addendum)
L>R side of body. Anticipate related to recurrent L pleural effusion. Labwork overall stable.  ?mass effect from large L pleural effusion causing some cardiac compression as well as venous compression leading to progressive L sided edema.  Pt at this time declines rpt thoracentesis - states still hurts from prior tap.  Will slowly increase lasix to 40 then 60 mg over next 6 days - kidneys have tolerated 20mg  dose well. Endorses good appetite and good protein intake. Check Alb today - added to yesterday's labwork from [facility]. Consider chest CT vs hospice referral.

## 2014-08-11 NOTE — Assessment & Plan Note (Signed)
Recheck xray today - marked white out of entire left lung field. Anticipate this is what is leading to progressing L sided swelling. Discussed option of repeat thoracentesis. However given pt is currently not dyspneic or with worsening cough, decides to decline this option for now. Previously has declined further workup of recurrent L pleural effusion. Multiple therapeutic and diagnostic taps have been unrevealing regarding etiology of pleural effusion.

## 2014-08-13 ENCOUNTER — Encounter: Payer: Self-pay | Admitting: Family Medicine

## 2014-08-13 ENCOUNTER — Telehealth: Payer: Self-pay | Admitting: Family Medicine

## 2014-08-13 ENCOUNTER — Other Ambulatory Visit: Payer: Self-pay | Admitting: Family Medicine

## 2014-08-13 DIAGNOSIS — R601 Generalized edema: Secondary | ICD-10-CM

## 2014-08-13 DIAGNOSIS — J90 Pleural effusion, not elsewhere classified: Secondary | ICD-10-CM

## 2014-08-13 DIAGNOSIS — E119 Type 2 diabetes mellitus without complications: Secondary | ICD-10-CM

## 2014-08-13 NOTE — Telephone Encounter (Signed)
Ann called you back about patient's x-ray.  Ronald Ball is on her way to his house and patient has gained weight and his legs are red and swollen.

## 2014-08-14 ENCOUNTER — Telehealth: Payer: Self-pay | Admitting: *Deleted

## 2014-08-14 ENCOUNTER — Other Ambulatory Visit: Payer: Self-pay | Admitting: Family Medicine

## 2014-08-14 DIAGNOSIS — J90 Pleural effusion, not elsewhere classified: Secondary | ICD-10-CM

## 2014-08-14 NOTE — Telephone Encounter (Signed)
Let's change lasix to 40mg  daily every day but need to ensure making good urine on this lower dose. Update us in 3 days with effect.

## 2014-08-14 NOTE — Telephone Encounter (Signed)
Patient's wife notified and verbalized understanding. She will call Friday with update.

## 2014-08-14 NOTE — Telephone Encounter (Signed)
Patient's wife called and asked if he should continue with the 60mg  of lasix. He so far has urinated ~8 times today and is still swollen/tight.

## 2014-08-15 NOTE — Telephone Encounter (Signed)
Message left advising Amy of med changes and xray results. Advised her of patient's decision to have CT instead of thoracentesis.

## 2014-08-17 ENCOUNTER — Ambulatory Visit (INDEPENDENT_AMBULATORY_CARE_PROVIDER_SITE_OTHER)
Admission: RE | Admit: 2014-08-17 | Discharge: 2014-08-17 | Disposition: A | Discharge status: Home / Self Care | Payer: Medicare PPO | Source: Ambulatory Visit | Attending: Family Medicine | Admitting: Family Medicine

## 2014-08-17 DIAGNOSIS — J90 Pleural effusion, not elsewhere classified: Secondary | ICD-10-CM

## 2014-08-17 MED ORDER — IOHEXOL 300 MG/ML  SOLN
80.0000 mL | Freq: Once | INTRAMUSCULAR | Status: AC | PRN
  Administered 2014-08-17: 80 mL via INTRAVENOUS

## 2014-08-20 ENCOUNTER — Telehealth: Payer: Self-pay | Admitting: Family Medicine

## 2014-08-20 NOTE — Telephone Encounter (Signed)
Amy @ [facility] called wanting you to call her. So she can give you an update on Mr Ronald Ball

## 2014-08-20 NOTE — Telephone Encounter (Signed)
Called and left a message - could increase lasix to 60mg  daily, but if pt short winded may need to go to hospital for IV diuresis and possible rpt tap.

## 2014-08-20 NOTE — Telephone Encounter (Signed)
Pulse ox is 95% on room air 88% with 30 ft of ambulation. Weight 197. Legs are about to pop almost up to his thigh and his abd feels very tight. Circumference of upper arm is 33 cm. Left lung sounds worse and he cant talk without coughing and he has been coughing up white frothy sputum. Patient's son has not informed him of the CT results. Amy really feels that he isn't being completely honest with how he feels when she asks him (he says he feels fine) and thinks he is truly at the verge of hospital admission. He says he will go if you tell him to go.

## 2014-08-21 NOTE — Telephone Encounter (Signed)
Spoke with patient's DIL. She was with patient and he said he wanted to try more fluid pills and did not want to go to the hospital since he was feeling fine. Advised to increase lasix to 60mg  QD and have home health call with an update at their next visit. She verbalized understanding.

## 2014-08-21 NOTE — Telephone Encounter (Signed)
We could try and get direct admission to hospital if pt desires.

## 2014-08-21 NOTE — Telephone Encounter (Signed)
Amy notified. She is going to talk to patient's son and have him give me a call with what they decide to do.

## 2014-08-22 ENCOUNTER — Other Ambulatory Visit: Payer: Self-pay | Admitting: Family Medicine

## 2014-08-22 ENCOUNTER — Other Ambulatory Visit: Payer: Self-pay | Admitting: Internal Medicine

## 2014-08-29 ENCOUNTER — Telehealth: Payer: Self-pay | Admitting: Family Medicine

## 2014-08-29 NOTE — Telephone Encounter (Signed)
Ronald Ball with [facility] called with an update: is not having having increased sob, edema has decreased,  Still has diminished lung on left side. Not having having difficulty breathing. Taking 60 mg lasix daily since 3/21.  If you have questions or concerns please call [phone]

## 2014-08-30 NOTE — Telephone Encounter (Signed)
Noted. Thanks.

## 2014-09-03 ENCOUNTER — Encounter: Payer: Self-pay | Admitting: Family Medicine

## 2014-09-03 ENCOUNTER — Ambulatory Visit (INDEPENDENT_AMBULATORY_CARE_PROVIDER_SITE_OTHER): Payer: Medicare PPO | Admitting: Family Medicine

## 2014-09-03 VITALS — BP 118/64 | HR 78 | Temp 98.1°F | Wt 205.5 lb

## 2014-09-03 DIAGNOSIS — K5900 Constipation, unspecified: Secondary | ICD-10-CM | POA: Diagnosis not present

## 2014-09-03 DIAGNOSIS — I872 Venous insufficiency (chronic) (peripheral): Secondary | ICD-10-CM | POA: Insufficient documentation

## 2014-09-03 DIAGNOSIS — J90 Pleural effusion, not elsewhere classified: Secondary | ICD-10-CM | POA: Diagnosis not present

## 2014-09-03 DIAGNOSIS — R609 Edema, unspecified: Secondary | ICD-10-CM

## 2014-09-03 DIAGNOSIS — I8312 Varicose veins of left lower extremity with inflammation: Secondary | ICD-10-CM

## 2014-09-03 DIAGNOSIS — M7989 Other specified soft tissue disorders: Secondary | ICD-10-CM | POA: Diagnosis not present

## 2014-09-03 DIAGNOSIS — K5909 Other constipation: Secondary | ICD-10-CM

## 2014-09-03 DIAGNOSIS — M5412 Radiculopathy, cervical region: Secondary | ICD-10-CM | POA: Diagnosis not present

## 2014-09-03 DIAGNOSIS — I8311 Varicose veins of right lower extremity with inflammation: Secondary | ICD-10-CM

## 2014-09-03 MED ORDER — GABAPENTIN 100 MG PO CAPS: 100.0000 mg | ORAL_CAPSULE | Freq: Every day | ORAL | Status: DC

## 2014-09-03 MED ORDER — GABAPENTIN 300 MG PO CAPS: 300.0000 mg | ORAL_CAPSULE | Freq: Every day | ORAL | Status: DC

## 2014-09-03 NOTE — Assessment & Plan Note (Addendum)
Anticipate from thoracic cavity vascular /venous compression from large pleural effusion. Discussed this. Continue lasix 60mg  daily. Check Cr today. rec continued elevation of L arm.

## 2014-09-03 NOTE — Progress Notes (Signed)
Pre visit review using our clinic review tool, if applicable. No additional management support is needed unless otherwise documented below in the visit note. 

## 2014-09-03 NOTE — Assessment & Plan Note (Addendum)
Present since 06/2013, s/p unrevealing thoracentesis 01/2014 and again 06/2014 CT unrevealing as well as to cause of chronic pleural effusion Discussed options including rpt thorcentesis (temporary solution) vs continued lasix 60mg  daily vs pleurx catheter placement Pt clear he does not want any surgical intervention. Check Cr today. Discussed option of hospice referral for at home hospice and palliative care evaluation. Pt interested in this. Referral placed today. Homebound 2/2 gen weakness, and large complete L pleural effusion with resultant dyspnea.

## 2014-09-03 NOTE — Assessment & Plan Note (Signed)
Anticipate due to CVI. Recommended elevate legs, continue vaseline or baby oil.

## 2014-09-03 NOTE — Progress Notes (Signed)
BP 118/64 mmHg  Pulse 78  Temp(Src) 98.1 F (36.7 C) (Oral)  Wt 205 lb 8 oz (93.214 kg)  SpO2 97%   CC: f/u visit, L sided swelling  Subjective:    Patient ID: Ronald Ball, male    DOB: 11-08-1913, 81 y.o.   MRN: [id number]  HPI: HASHEEM VOLAND is a 81 y.o. male presenting on 09/03/2014 for Edema   See recent chest CT and office visit and phone notes for interim history. CT chest showed persistent large L pleural effusion larger than previously along with stenosis of R subclavian vein.  I discussed options of rpt thoracentesis vs referral to [facility] for pleurex catheter. Son opted to continue lasix  daily - taking this since 08/20/2014.   Continued mild weight gain noted. Continued neuropathic pain in back. Continued L arm swelling.  Unable to lay flat 2/2 back pain - sleeps in recliner.  Relevant past medical, surgical, family and social history reviewed and updated as indicated. Interim medical history since our last visit reviewed. Allergies and medications reviewed and updated. Current Outpatient Prescriptions on File Prior to Visit  Medication Sig  . acetaminophen (TYLENOL) 500 MG tablet Take 2 tablets (1,000 mg total) by mouth 3 (three) times daily.  Marland Kitchen aspirin EC 81 MG tablet Take 81 mg by mouth every other day.  . Carboxymethylcellulose Sodium (REFRESH PLUS OP) Apply 1 drop to eye 3 (three) times daily as needed (dry eyes). Both eyes  . cholecalciferol (VITAMIN D) 1000 UNITS tablet Take 1,000 Units by mouth daily.  Marland Kitchen docusate sodium (COLACE) 100 MG capsule Take 1 capsule (100 mg total) by mouth daily.  . furosemide (LASIX) 20 MG tablet Take 60 mg by mouth daily.   Marland Kitchen HYDROcodone-acetaminophen (NORCO/VICODIN) 5-325 MG per tablet Take 1-2 tablets by mouth 3 (three) times daily as needed for moderate pain. (Patient taking differently: Take 2 tablets by mouth at bedtime. )  . ibuprofen (ADVIL,MOTRIN) 200 MG tablet Take 200 mg by mouth at bedtime.   Marland Kitchen latanoprost  (XALATAN) 0.005 % ophthalmic solution Place 1 drop into the right eye at bedtime.   . metoprolol succinate (TOPROL-XL) 25 MG 24 hr tablet TAKE 1 TABLET EVERY DAY  . potassium chloride (K-DUR) 10 MEQ tablet Take 1 tablet (10 mEq total) by mouth every Monday, Wednesday, and Friday.  . timolol (TIMOPTIC) 0.5 % ophthalmic solution Place 1 drop into both eyes 2 (two) times daily.    . benzonatate (TESSALON) 100 MG capsule Take 1 capsule (100 mg total) by mouth 3 (three) times daily as needed for cough. Swallow, don't chew (Patient not taking: Reported on 09/03/2014)  . magnesium hydroxide (MILK OF MAGNESIA) 400 MG/5ML suspension Take 45 mLs by mouth daily as needed for mild constipation.   No current facility-administered medications on file prior to visit.    Review of Systems Per HPI unless specifically indicated above     Objective:    BP 118/64 mmHg  Pulse 78  Temp(Src) 98.1 F (36.7 C) (Oral)  Wt 205 lb 8 oz (93.214 kg)  SpO2 97%  Wt Readings from Last 3 Encounters:  09/03/14 205 lb 8 oz (93.214 kg)  08/10/14 203 lb 4 oz (92.194 kg)  07/06/14 189 lb (85.73 kg)    Physical Exam  Constitutional: He appears well-developed and well-nourished. No distress.  elderly  HENT:  Mouth/Throat: Oropharynx is clear and moist. No oropharyngeal exudate.  Eyes: Conjunctivae and EOM are normal. Pupils are equal, round, and reactive to light.  No scleral icterus.  Neck: Normal range of motion. Neck supple.  Cardiovascular: Normal rate, regular rhythm, normal heart sounds and intact distal pulses.   No murmur heard. Pulmonary/Chest: Effort normal. No respiratory distress. He has decreased breath sounds in the left upper field, the left middle field and the left lower field. He has no wheezes. He has no rhonchi. He has no rales.  Absent L lung sounds  Musculoskeletal: He exhibits edema (marked L upper extremity and lowe extremities).  Mild pedal edema BLE with erythematous rash present  Skin: Skin is  warm and dry. Rash noted. There is erythema.  Mild BLE  Nursing note and vitals reviewed.  Results for orders placed or performed during the hospital encounter of 06/20/14  Body fluid culture  Result Value Ref Range   Specimen Description PLEURAL    Special Requests NONE    Gram Stain      NO WBC SEEN NO ORGANISMS SEEN Performed at [facility]    Culture      NO GROWTH 3 DAYS Performed at [facility]    Report Status 06/23/2014 FINAL   Body fluid cell count with differential  Result Value Ref Range   Fluid Type-FCT PLEURAL    Color, Fluid YELLOW YELLOW   Appearance, Fluid CLOUDY (A) CLEAR   WBC, Fluid 58 0 - 1000 cu mm   Neutrophil Count, Fluid 18 0 - 25 %   Lymphs, Fluid 12 %   Monocyte-Macrophage-Serous Fluid 70 50 - 90 %   Other Cells, Fluid CORRELATE WITH CYTOLOGY. %   CT CHEST WITH CONTRAST TECHNIQUE: Multidetector CT imaging of the chest was performed during intravenous contrast administration.  CONTRAST: 80mL OMNIPAQUE IOHEXOL 300 MG/ML SOLN  COMPARISON: 08/10/2014  FINDINGS: There is complete consolidation of the left lung identified. A large left pleural effusion is noted. The overall appearance is similar to that seen on the prior plain film examination. Abrupt cut off of the bronchial tree is noted although no definitive mass lesion is identified. The left-sided pulmonary arterial branches show normal opacification. No significant hilar or mediastinal adenopathy is noted. The thoracic aorta shows mild calcifications without aneurysmal dilatation. Some mediastinal shift to the right is seen. The right lung show some scattered small less than 5 mm nodular densities in the upper lobe. No other definitive nodule is seen. No focal confluent infiltrate is noted.  Scanning into the upper abdomen reveals a 2.4 cm lesion of the right adrenal gland likely representing an adenoma. The left adrenal is within normal limits. No acute bony abnormality is  noted. There are changes suggestive of narrowing of the right subclavian vein at the first costoclavicular space. Multiple chest wall collaterals are noted.  IMPRESSION: Complete consolidation of the left lung with an associated large left-sided pleural effusion. The overall appearance is similar to that seen on recent plain film examination.  Tiny nodules in the right upper lobe. If the patient is at high risk for bronchogenic carcinoma, follow-up chest CT at 6-12 months is recommended. If the patient is at low risk for bronchogenic carcinoma, follow-up chest CT at 12 months is recommended. This recommendation follows the consensus statement: Guidelines for Management of Small Pulmonary Nodules Detected on CT Scans: A Statement from the Fleischner Society as published in Radiology 2005;237:395-400.  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  Right adrenal gland lesion likely representing an adenoma. This has been stable from a prior exam dated in 2007. Electronically Signed  By: Loraine Leriche  Lukens M.D.  On: 08/17/2014 15:45    Assessment & Plan:   Problem List Items Addressed This Visit    Stasis dermatitis of both legs    Anticipate due to CVI. Recommended elevate legs, continue vaseline or baby oil.      Recurrent left pleural effusion - Primary    Present since 06/2013, s/p unrevealing thoracentesis 01/2014 and again 06/2014 CT unrevealing as well as to cause of chronic pleural effusion Discussed options including rpt thorcentesis (temporary solution) vs continued lasix 60mg  daily vs pleurx catheter placement Pt clear he does not want any surgical intervention. Check Cr today. Discussed option of hospice referral for at home hospice and palliative care evaluation. Pt interested in this. Referral placed today. Homebound 2/2 gen weakness, and large complete L pleural effusion with resultant dyspnea.      Edema    Anticipate from thoracic cavity vascular  /venous compression from large pleural effusion. Discussed this. Continue lasix 60mg  daily. Check Cr today. rec continued elevation of L arm.      Relevant Orders   Renal function panel   Chronic constipation    On hydrocodone. Regular w colace. miralax too strong in past.      Cervical radiculitis    MRI 06/2014: C2-7 L>R facet degeneration with mod-severe foraminal narrowing Will continue to increase gabapentin - to 300mg  nightly and 100mg  at lunch, with continued mid day titration as tolerated. Unable to lay flat 2/2 worsening cervical radicular pain. Pt declined interlaminar epidural injection and hospital bed in the past.      Relevant Medications   gabapentin (NEURONTIN) capsule   gabapentin (NEURONTIN) capsule       Follow up plan: Return if symptoms worsen or fail to improve.

## 2014-09-03 NOTE — Assessment & Plan Note (Addendum)
MRI 06/2014: C2-7 L>R facet degeneration with mod-severe foraminal narrowing Will continue to increase gabapentin - to 300mg  nightly and 100mg  at lunch, with continued mid day titration as tolerated. Unable to lay flat 2/2 worsening cervical radicular pain. Pt declined interlaminar epidural injection and hospital bed in the past.

## 2014-09-03 NOTE — Patient Instructions (Addendum)
labwork today. Increase gabapentin to 300mg  at night time, 100mg  mid day after lunch. I will ask hospice/palliative care team to come out to your house over next week. Continue hydrocodone.

## 2014-09-03 NOTE — Assessment & Plan Note (Signed)
On hydrocodone. Regular w colace. miralax too strong in past.

## 2014-09-04 ENCOUNTER — Telehealth: Payer: Self-pay | Admitting: Family Medicine

## 2014-09-04 DIAGNOSIS — J90 Pleural effusion, not elsewhere classified: Secondary | ICD-10-CM

## 2014-09-04 LAB — RENAL FUNCTION PANEL
Albumin: 3.6 g/dL (ref 3.5–5.2)
BUN: 25 mg/dL — AB (ref 6–23)
CHLORIDE: 93 meq/L — AB (ref 96–112)
CO2: 37 mEq/L — ABNORMAL HIGH (ref 19–32)
Calcium: 9.4 mg/dL (ref 8.4–10.5)
Creatinine, Ser: 1.2 mg/dL (ref 0.40–1.50)
GFR: 59.36 mL/min — AB (ref >60.00)
Glucose, Bld: 172 mg/dL — ABNORMAL HIGH (ref 70–99)
POTASSIUM: 3.9 meq/L (ref 3.5–5.1)
Phosphorus: 3 mg/dL (ref 2.3–4.6)
Sodium: 137 mEq/L (ref 135–145)

## 2014-09-04 NOTE — Telephone Encounter (Signed)
Ronald Ball with Hospice called to let you know that she met with the patient and family today. Patient and family have decided that he would like a referral to a cardio-vascular surgeon to discuss if he is a candidate for a drain. Please contact patient's family regarding this referral.

## 2014-09-04 NOTE — Telephone Encounter (Signed)
Noted  Referral placed.

## 2014-09-05 ENCOUNTER — Telehealth: Payer: Self-pay | Admitting: *Deleted

## 2014-09-05 NOTE — Telephone Encounter (Signed)
Called [facility] spoke with Ronald AspCindy, she had Ronald Ball set up to see Dr Sena HitchGearhardt for tomm 09/06/14 at 4pm just for a consult and then the drain would be placed the next day probably. Called the patient and Mrs Ronald Ball said that he doesn't want the drain put in at all. Had to call [facility] and cancel the appt for tomm. They want you to put [facility] the fluid to be drained off again instead of the drain. Pls advise, she said she left me a message saying that he didn't want the drain but I didn't get the message.

## 2014-09-05 NOTE — Telephone Encounter (Signed)
Patient refused to come to appointment set up for 4/7 with Dr Tyrone SageGerhardt per Dr [name] office to evaluate recurrent left pleural effusion--cancelled appointment

## 2014-09-06 ENCOUNTER — Encounter: Payer: Medicare PPO | Admitting: Cardiothoracic Surgery

## 2014-09-06 NOTE — Telephone Encounter (Signed)
Thoracentesis ordered. Thank you.

## 2014-09-07 ENCOUNTER — Encounter: Payer: Self-pay | Admitting: *Deleted

## 2014-09-10 ENCOUNTER — Ambulatory Visit (HOSPITAL_COMMUNITY)
Admission: RE | Admit: 2014-09-10 | Discharge: 2014-09-10 | Disposition: A | Discharge status: Home / Self Care | Payer: Medicare PPO | Source: Ambulatory Visit | Attending: Family Medicine | Admitting: Family Medicine

## 2014-09-10 ENCOUNTER — Ambulatory Visit (HOSPITAL_COMMUNITY)
Admission: RE | Admit: 2014-09-10 | Discharge: 2014-09-10 | Disposition: A | Discharge status: Home / Self Care | Payer: Medicare PPO | Source: Ambulatory Visit | Attending: Radiology | Admitting: Radiology

## 2014-09-10 DIAGNOSIS — J90 Pleural effusion, not elsewhere classified: Secondary | ICD-10-CM | POA: Diagnosis present

## 2014-09-10 DIAGNOSIS — Z9889 Other specified postprocedural states: Secondary | ICD-10-CM

## 2014-09-10 NOTE — Procedures (Signed)
Successful US guided left thoracentesis. Yielded 1.4 liters of brown colored fluid. Pt tolerated procedure well. No immediate complications. Procedure stopped early with remaining pleural fluid seen secondary to pain   Specimen was not sent for labs. CXR ordered.  Pattricia BossMORGAN, Makaylyn Sinyard D PA-C 09/10/2014 2:53 PM

## 2014-09-12 ENCOUNTER — Telehealth: Payer: Self-pay

## 2014-09-12 DIAGNOSIS — M5412 Radiculopathy, cervical region: Secondary | ICD-10-CM

## 2014-09-12 MED ORDER — FUROSEMIDE 20 MG PO TABS
ORAL_TABLET | ORAL | Status: AC
Start: 2014-09-12 — End: ?

## 2014-09-12 MED ORDER — GABAPENTIN 300 MG PO CAPS: ORAL_CAPSULE | ORAL | Status: AC

## 2014-09-12 MED ORDER — HYDROCODONE-ACETAMINOPHEN 5-325 MG PO TABS: 2.0000 | ORAL_TABLET | Freq: Every day | ORAL | Status: AC

## 2014-09-12 MED ORDER — GABAPENTIN 100 MG PO CAPS: ORAL_CAPSULE | ORAL | Status: AC

## 2014-09-12 NOTE — Telephone Encounter (Signed)
Mrs Sedonia SmallRoberson wants to know if pt should continue taking his meds as he has been taking since had fluid drawn off lungs earlier this week. Also wants to know if should continue Gabapentin 100 mg after lunch and 300 mg @ hs. Pt seems to be doing OK since had fluid drawn off. Pt did slide out of chair this morning and slid to floor; EMS assisted pt back up with no apparent injury. Mrs Sedonia SmallRoberson request cb.[facility].

## 2014-09-12 NOTE — Telephone Encounter (Signed)
Duplicate/error

## 2014-09-12 NOTE — Telephone Encounter (Signed)
Printed and in kims'box  

## 2014-09-12 NOTE — Telephone Encounter (Signed)
I'm glad he's doing well after fluid removed. Continue meds as up to now - lasix 60mg  daily and gabapentin 100mg  after lunch and 300mg  at bedtime. Lab Results  Component Value Date   CREATININE 1.20 09/03/2014

## 2014-09-12 NOTE — Telephone Encounter (Signed)
Patient's wife came to office before can call to pick up. Gave Rx for hydrocodone to front office.

## 2014-09-12 NOTE — Telephone Encounter (Signed)
Patient's wife notified and verbalized understanding. She requested refill on gabapentin, lasix and hydrocodone. Gabapentin and lasix sent in . Advised Rx for hydrocodone would have to be pick up. She verbalized understanding.

## 2014-09-17 ENCOUNTER — Telehealth: Payer: Self-pay | Admitting: Family Medicine

## 2014-09-17 NOTE — Telephone Encounter (Signed)
Hospice called, they are wanting to do a hospice admissions tomorrow am.   Would Dr g be the attending? Would he want hospice drs to do symptom management? If there are any recent records/labs, can you please send? Call back number [phone].

## 2014-09-17 NOTE — Telephone Encounter (Signed)
Spoke with Aimee [facility](HH RN). She said she re-suggested hospice at her visit today because it was time to recert or discharge him. She knew they were requiring help with bathing and comfort measures, so she talked with the family and they agreed to let hospice come out for eval tomorrow. She called [facility] and found out that the patient had declined their services last time because he was wanting extraordinary measures (Pleurix) and they wouldn't accept that. She advised the patient that it may be the right route now since he was requiring more assistance and hospice wouldn't have to discharge him. He finally agreed for a consult.  Aimee did ask about adding Metolazone since he is still swollen and has decreased breath sounds, or if you just wanted to leave him at his current lasix dose. She said even after the thoracentesis, his breath sounds haven't improved.

## 2014-09-17 NOTE — Telephone Encounter (Signed)
Stacy with [facility] called back checking on  Order She wanted to know if dr G would be the attending physician and would he want hospice dr to symptom management She would like recent labs or any office faxed to them Fax # [phone] Phon [phone]

## 2014-09-18 MED ORDER — METOLAZONE 2.5 MG PO TABS: 2.5000 mg | ORAL_TABLET | ORAL | Status: AC

## 2014-09-18 NOTE — Telephone Encounter (Signed)
Ok to do. I would like hospice doc to help manage sxs. We can add metolazone - would only take one pill twice a week as needed to start. Sent in.

## 2014-09-18 NOTE — Telephone Encounter (Signed)
[facility] Patient was admitted to hospice today. They will call if they require anything from our office.

## 2014-09-19 ENCOUNTER — Telehealth: Payer: Self-pay | Admitting: Family Medicine

## 2014-09-19 NOTE — Telephone Encounter (Signed)
Michelle notified. She was asking if there was any contraindications to trying a compression sleeve to his arm. If not, she would like to try this if it is okay.

## 2014-09-19 NOTE — Telephone Encounter (Signed)
Ok to do this - does she need a Rx?

## 2014-09-19 NOTE — Telephone Encounter (Signed)
Pt was admitted to hospice Tuesday and want to be a DNR.  Dr Gibson RampFeldman is willing to sign the DNR, just want to make sure that you are okay with it.  Dr Gibson RampFeldman will help to manage pt's symptoms should they arrive ( sob, pain) Best call back number is [phone].

## 2014-09-19 NOTE — Telephone Encounter (Signed)
Ok to do this. Thank you.  

## 2014-09-20 NOTE — Telephone Encounter (Signed)
Spoke with Marcelino DusterMichelle and she just needed the verbal order.

## 2014-10-01 ENCOUNTER — Ambulatory Visit: Payer: Medicare PPO | Admitting: Family Medicine

## 2014-10-25 ENCOUNTER — Telehealth: Payer: Self-pay | Admitting: Family Medicine

## 2014-10-26 NOTE — Telephone Encounter (Signed)
Called family, no answer.

## 2014-10-30 NOTE — Telephone Encounter (Signed)
Called family, [facility], expressed my condolences.

## 2014-10-31 NOTE — Telephone Encounter (Signed)
Ronald DusterMichelle called to notify Dr.Gutierrez patient passed away on 10/16/2014 at 8:15am.

## 2014-10-31 NOTE — Telephone Encounter (Signed)
Please see below information

## 2014-10-31 DEATH — deceased

## 2014-11-02 ENCOUNTER — Telehealth: Payer: Self-pay | Admitting: *Deleted

## 2014-11-02 NOTE — Telephone Encounter (Signed)
[facility] [place] ref [place] [facility] [place]. They said that you had listed hip fx as a reason for his death and that isn't a valid reason. They are sending the death cert back for correction.

## 2014-11-02 NOTE — Telephone Encounter (Signed)
Changed and in Kims' box. 

## 2014-11-02 NOTE — Telephone Encounter (Signed)
[employer] [employer] [employer] notified and DC placed up front for pick up.

## 2016-01-16 IMAGING — DX DG CHEST 1V
1 series · 1 of 1 positions shown · non-contrast
Comparison: 12/08/2013

CLINICAL DATA: Fall.  Hip fracture.

EXAM:
CHEST - 1 VIEW

[chest ap]
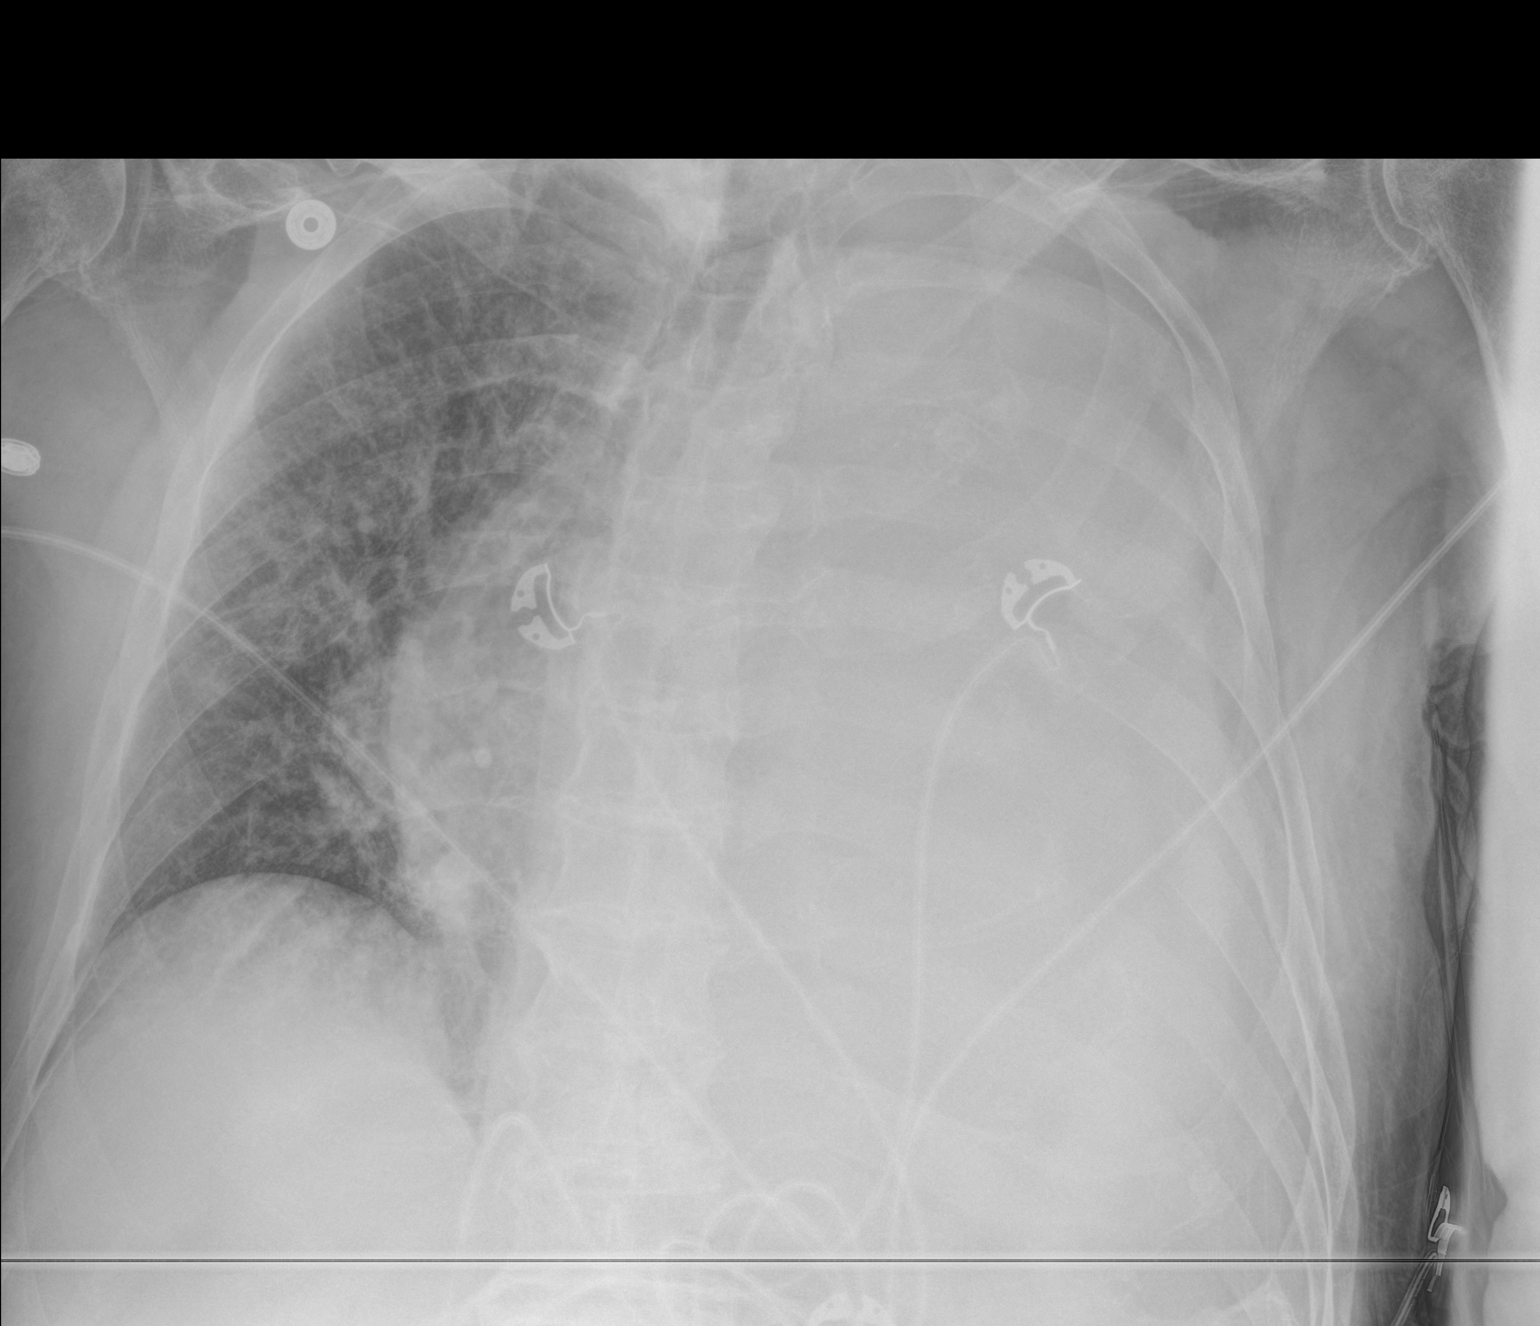

[1 of 1 positions shown; findings below may reference images not displayed]

FINDINGS: Complete opacification of the left hemi thorax is without change.
This is likely a large effusion. There is no evidence of volume
loss. Mediastinal structures are mildly displaced to the right
supporting large pleural effusion.

Increased vascularity is noted in the right lung accentuated by the
supine technique. No right lung consolidation or overt edema.

Bony thorax is extensively demineralized.
IMPRESSION: No significant change from the prior study. Large left pleural
effusion opacifies the left hemi thorax. No convincing pulmonary
edema or infiltrate in the right lung.

## 2016-01-16 IMAGING — CR DG HIP (WITH OR WITHOUT PELVIS) 2-3V*L*
5 series · 5 of 5 positions shown · non-contrast
Comparison: 02/13/2013

CLINICAL DATA: Fall, left hip pain

EXAM:
LEFT HIP - COMPLETE 2+ VIEW

[x pelvis]
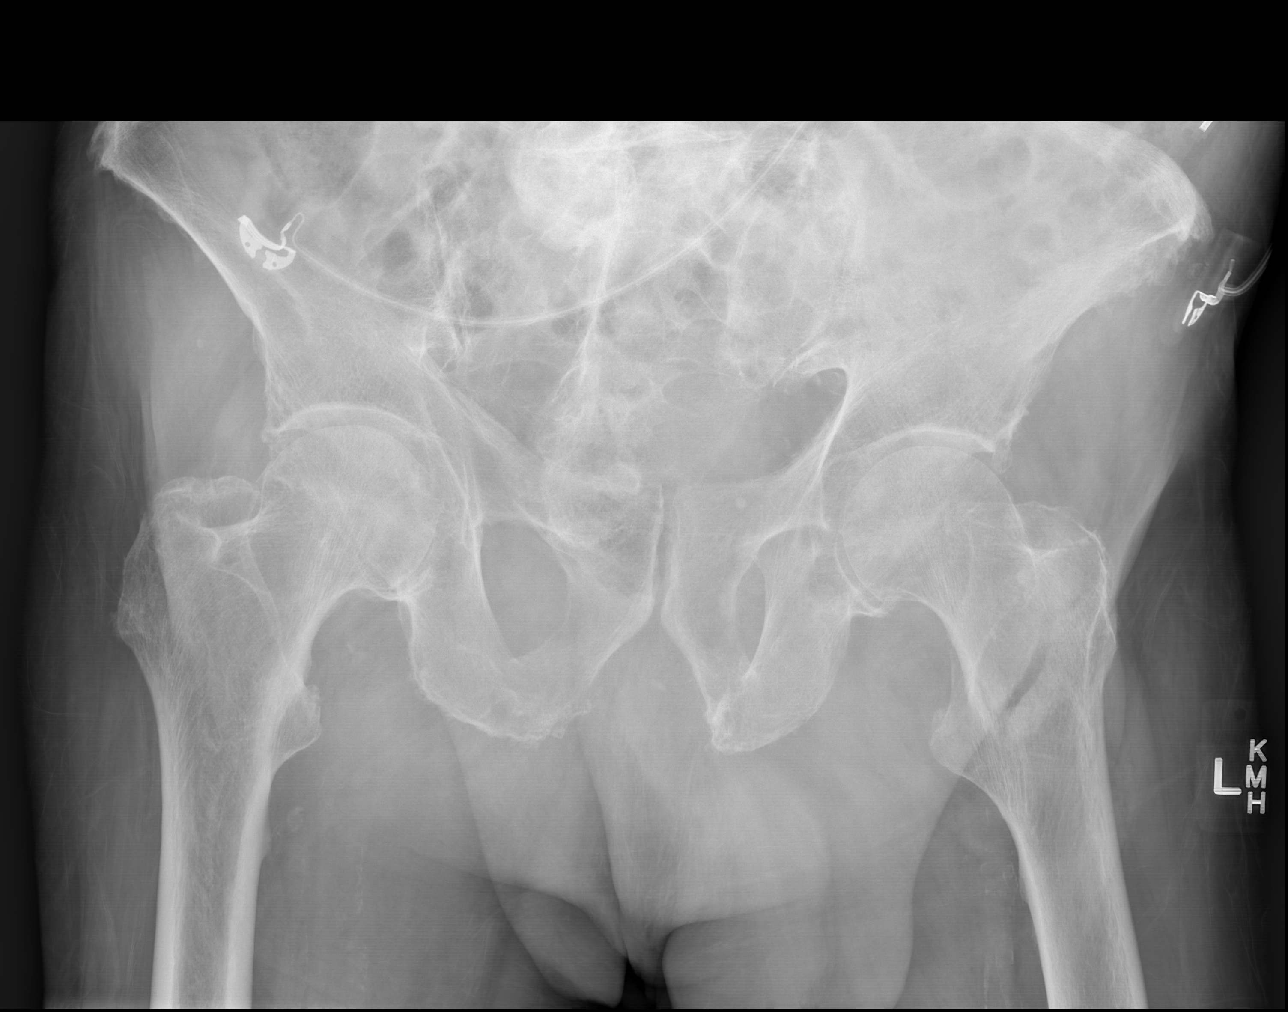

[x hip ap left]
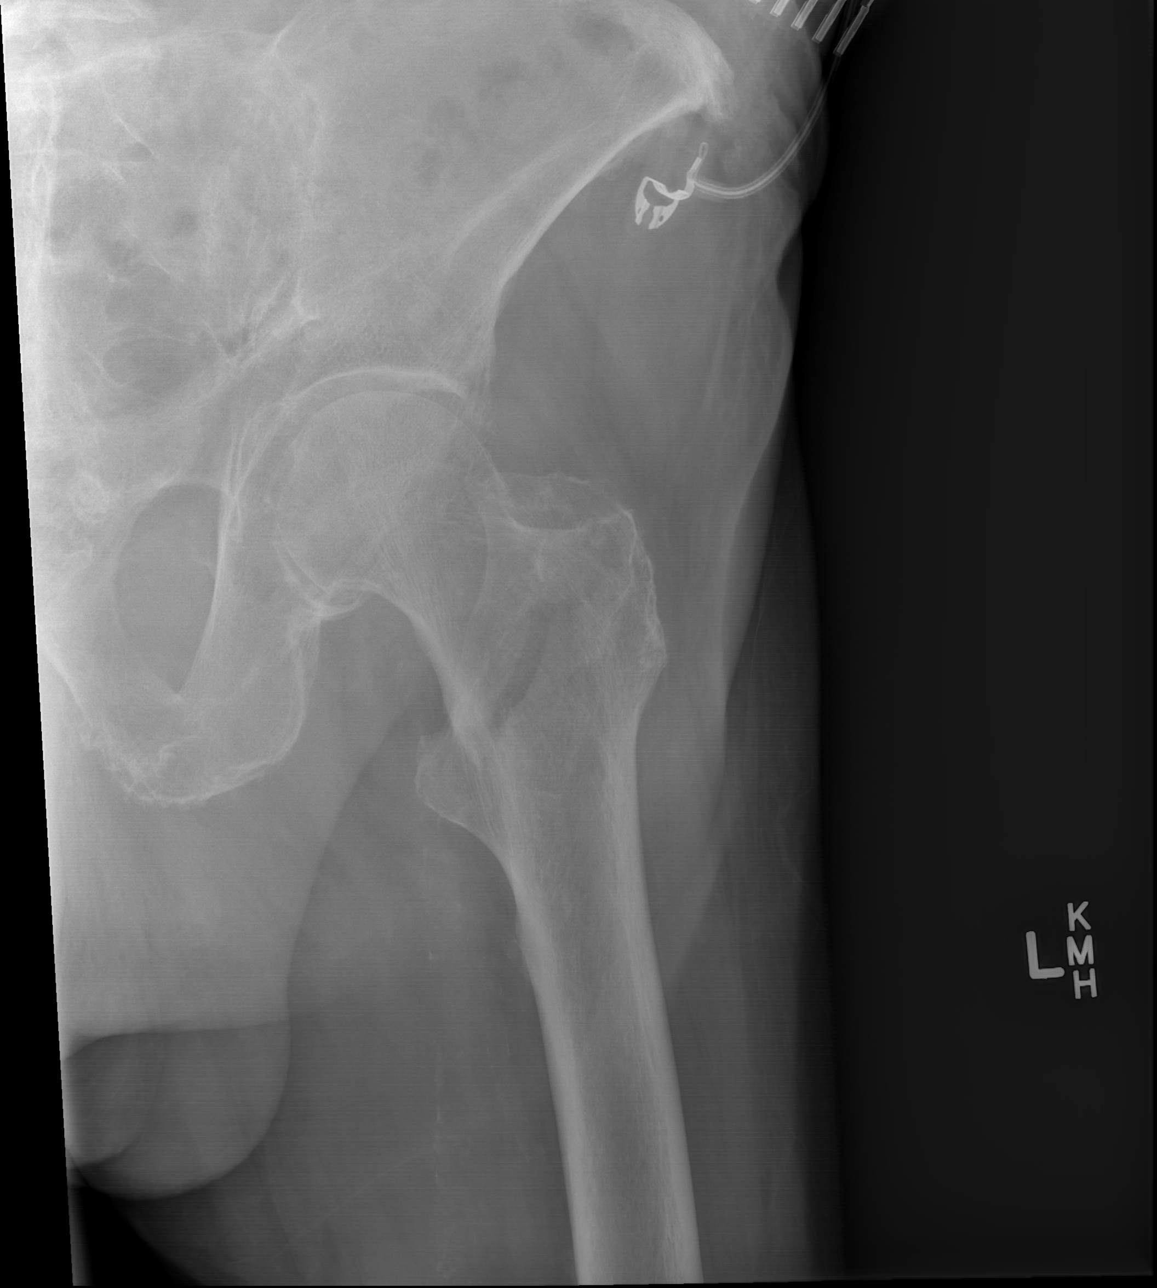

[w hip lat left (1 of 3)]
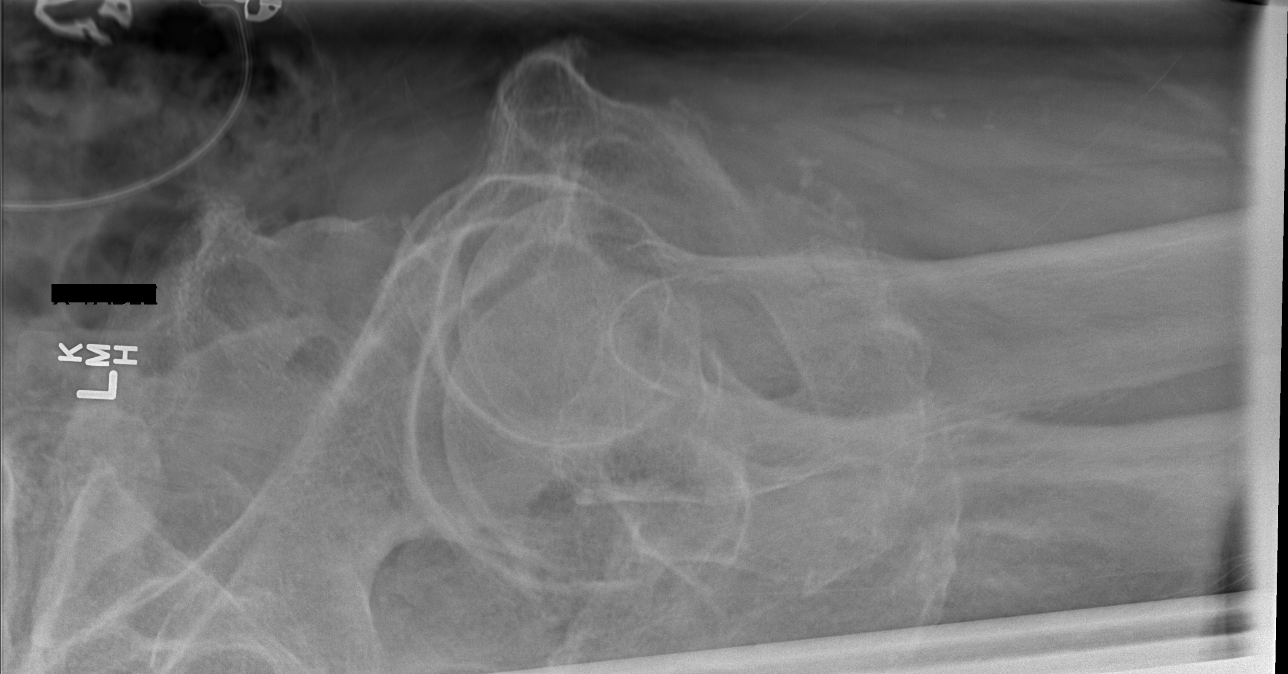

[w hip lat left (2 of 3)]
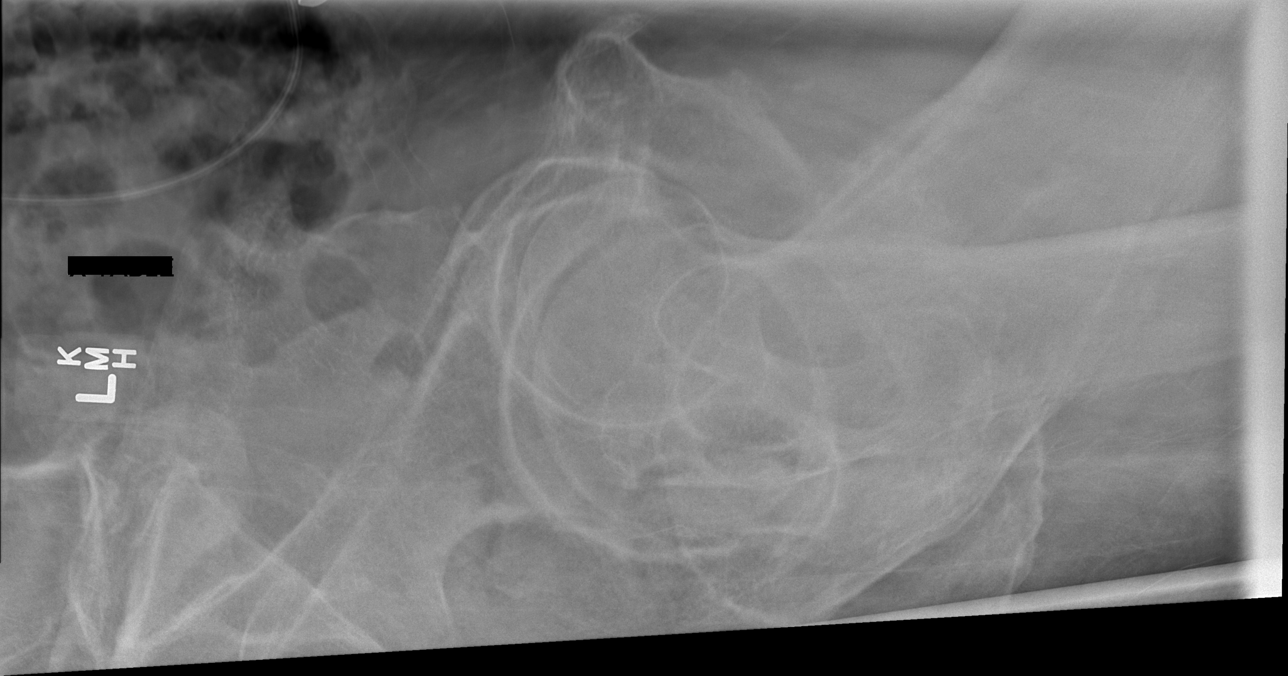

[w hip lat left (3 of 3)]
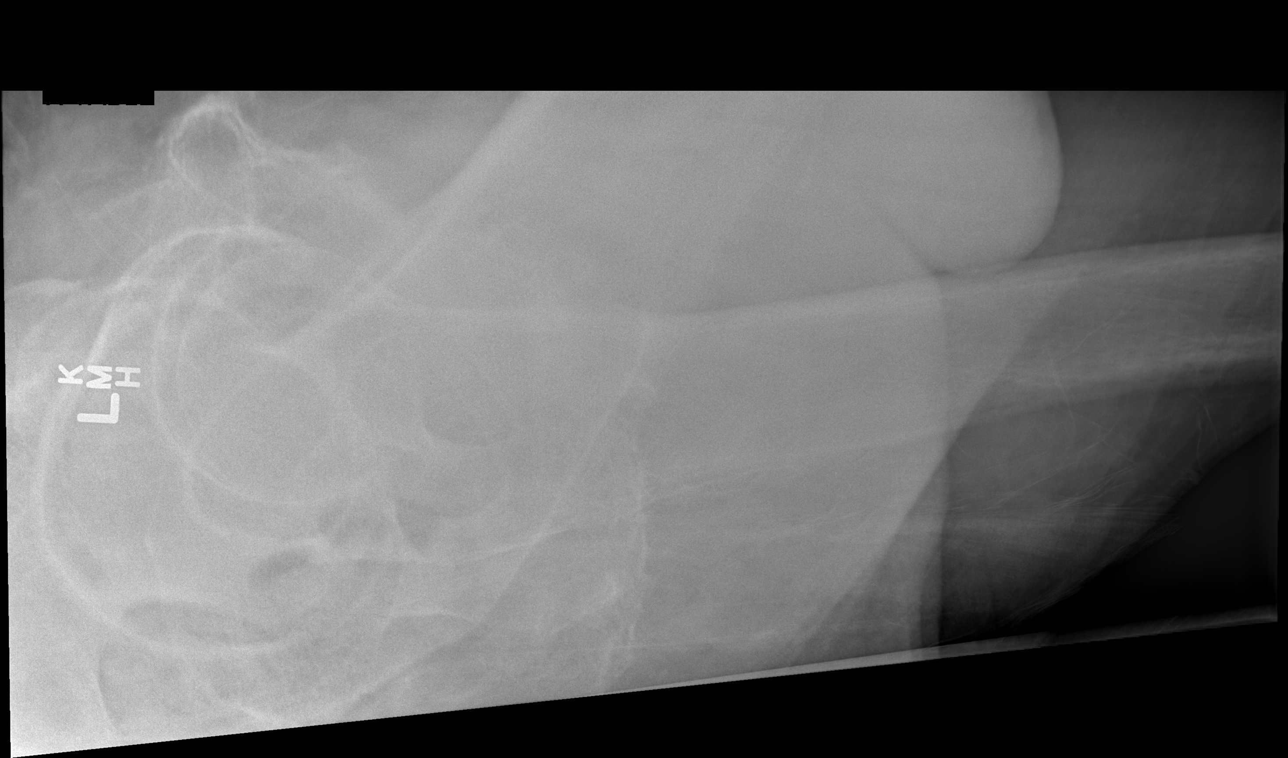

[5 of 5 positions shown; findings below may reference images not displayed]

FINDINGS: Hips are located.  No pelvic fracture sacral fracture.

There is a curvilinear lucency extending from the greater trochanter
towards the lesser trochanter of the left hip. This persists on 2
images. The hip is internally rotated on both images which limits
evaluation.
IMPRESSION: Findings most consistent with left intertrochanteric femur fracture.
